# Patient Record
Sex: Female | Born: 1955 | Race: Black or African American | Hispanic: No | Marital: Single | Smoking: Former smoker
Health system: Southern US, Community
[De-identification: ages and names within clinical notes are randomized; demographics above are authoritative.]

## PROBLEM LIST (undated history)

## (undated) DIAGNOSIS — M545 Low back pain, unspecified: Secondary | ICD-10-CM

## (undated) DIAGNOSIS — J42 Unspecified chronic bronchitis: Secondary | ICD-10-CM

## (undated) DIAGNOSIS — B1920 Unspecified viral hepatitis C without hepatic coma: Secondary | ICD-10-CM

## (undated) DIAGNOSIS — C50912 Malignant neoplasm of unspecified site of left female breast: Secondary | ICD-10-CM

## (undated) DIAGNOSIS — M7750 Other enthesopathy of unspecified foot: Secondary | ICD-10-CM

## (undated) DIAGNOSIS — J302 Other seasonal allergic rhinitis: Secondary | ICD-10-CM

## (undated) DIAGNOSIS — F329 Major depressive disorder, single episode, unspecified: Secondary | ICD-10-CM

## (undated) DIAGNOSIS — G8929 Other chronic pain: Secondary | ICD-10-CM

## (undated) DIAGNOSIS — Z923 Personal history of irradiation: Secondary | ICD-10-CM

## (undated) DIAGNOSIS — F32A Depression, unspecified: Secondary | ICD-10-CM

## (undated) DIAGNOSIS — F419 Anxiety disorder, unspecified: Secondary | ICD-10-CM

## (undated) DIAGNOSIS — N183 Chronic kidney disease, stage 3 unspecified: Secondary | ICD-10-CM

## (undated) DIAGNOSIS — R252 Cramp and spasm: Secondary | ICD-10-CM

## (undated) DIAGNOSIS — G4700 Insomnia, unspecified: Secondary | ICD-10-CM

## (undated) DIAGNOSIS — K219 Gastro-esophageal reflux disease without esophagitis: Secondary | ICD-10-CM

## (undated) DIAGNOSIS — M7989 Other specified soft tissue disorders: Secondary | ICD-10-CM

## (undated) DIAGNOSIS — M1990 Unspecified osteoarthritis, unspecified site: Secondary | ICD-10-CM

## (undated) DIAGNOSIS — I10 Essential (primary) hypertension: Secondary | ICD-10-CM

## (undated) DIAGNOSIS — E119 Type 2 diabetes mellitus without complications: Secondary | ICD-10-CM

## (undated) HISTORY — PX: OTHER SURGICAL HISTORY: SHX169

## (undated) HISTORY — PX: COLONOSCOPY: SHX174

## (undated) HISTORY — PX: DILATION AND CURETTAGE OF UTERUS: SHX78

## (undated) HISTORY — DX: Unspecified osteoarthritis, unspecified site: M19.90

## (undated) HISTORY — PX: TONSILLECTOMY: SUR1361

## (undated) HISTORY — DX: Essential (primary) hypertension: I10

## (undated) HISTORY — PX: MOUTH SURGERY: SHX715

---

## 2000-09-02 ENCOUNTER — Other Ambulatory Visit: Admission: RE | Admit: 2000-09-02 | Discharge: 2000-09-02 | Payer: Self-pay | Admitting: Family Medicine

## 2000-10-23 ENCOUNTER — Ambulatory Visit (HOSPITAL_COMMUNITY): Admission: RE | Admit: 2000-10-23 | Discharge: 2000-10-23 | Payer: Self-pay | Admitting: Family Medicine

## 2000-10-23 ENCOUNTER — Encounter: Payer: Self-pay | Admitting: Family Medicine

## 2001-04-30 ENCOUNTER — Ambulatory Visit (HOSPITAL_COMMUNITY): Admission: RE | Admit: 2001-04-30 | Discharge: 2001-04-30 | Payer: Self-pay | Admitting: Family Medicine

## 2001-04-30 ENCOUNTER — Encounter: Payer: Self-pay | Admitting: Family Medicine

## 2001-05-22 ENCOUNTER — Ambulatory Visit (HOSPITAL_COMMUNITY): Admission: RE | Admit: 2001-05-22 | Discharge: 2001-05-22 | Payer: Self-pay | Admitting: Family Medicine

## 2001-05-22 ENCOUNTER — Encounter: Payer: Self-pay | Admitting: Family Medicine

## 2001-09-18 ENCOUNTER — Encounter: Admission: RE | Admit: 2001-09-18 | Discharge: 2001-10-16 | Payer: Self-pay | Admitting: Neurosurgery

## 2002-01-06 ENCOUNTER — Encounter: Payer: Self-pay | Admitting: Family Medicine

## 2002-01-06 ENCOUNTER — Ambulatory Visit (HOSPITAL_COMMUNITY): Admission: RE | Admit: 2002-01-06 | Discharge: 2002-01-06 | Payer: Self-pay | Admitting: Family Medicine

## 2002-05-06 ENCOUNTER — Ambulatory Visit (HOSPITAL_COMMUNITY): Admission: RE | Admit: 2002-05-06 | Discharge: 2002-05-06 | Payer: Self-pay | Admitting: Internal Medicine

## 2002-05-21 ENCOUNTER — Encounter: Admission: RE | Admit: 2002-05-21 | Discharge: 2002-05-21 | Payer: Self-pay | Admitting: Family Medicine

## 2002-05-21 ENCOUNTER — Encounter: Payer: Self-pay | Admitting: Family Medicine

## 2002-09-02 ENCOUNTER — Other Ambulatory Visit: Admission: RE | Admit: 2002-09-02 | Discharge: 2002-09-02 | Payer: Self-pay | Admitting: Family Medicine

## 2002-09-17 HISTORY — PX: TRIGGER FINGER RELEASE: SHX641

## 2003-01-26 ENCOUNTER — Ambulatory Visit (HOSPITAL_COMMUNITY): Admission: RE | Admit: 2003-01-26 | Discharge: 2003-01-26 | Payer: Self-pay | Admitting: Family Medicine

## 2003-01-26 ENCOUNTER — Encounter: Payer: Self-pay | Admitting: Family Medicine

## 2003-02-09 ENCOUNTER — Encounter: Payer: Self-pay | Admitting: Emergency Medicine

## 2003-02-09 ENCOUNTER — Emergency Department (HOSPITAL_COMMUNITY): Admission: EM | Admit: 2003-02-09 | Discharge: 2003-02-09 | Payer: Self-pay | Admitting: Emergency Medicine

## 2003-05-11 ENCOUNTER — Ambulatory Visit (HOSPITAL_BASED_OUTPATIENT_CLINIC_OR_DEPARTMENT_OTHER): Admission: RE | Admit: 2003-05-11 | Discharge: 2003-05-11 | Payer: Self-pay | Admitting: Orthopedic Surgery

## 2003-06-24 ENCOUNTER — Emergency Department (HOSPITAL_COMMUNITY): Admission: EM | Admit: 2003-06-24 | Discharge: 2003-06-24 | Payer: Self-pay | Admitting: Emergency Medicine

## 2003-08-16 ENCOUNTER — Ambulatory Visit (HOSPITAL_COMMUNITY): Admission: RE | Admit: 2003-08-16 | Discharge: 2003-08-16 | Payer: Self-pay | Admitting: Orthopedic Surgery

## 2003-08-16 ENCOUNTER — Ambulatory Visit (HOSPITAL_BASED_OUTPATIENT_CLINIC_OR_DEPARTMENT_OTHER): Admission: RE | Admit: 2003-08-16 | Discharge: 2003-08-16 | Payer: Self-pay | Admitting: Orthopedic Surgery

## 2004-01-14 ENCOUNTER — Ambulatory Visit (HOSPITAL_COMMUNITY): Admission: RE | Admit: 2004-01-14 | Discharge: 2004-01-14 | Payer: Self-pay | Admitting: Cardiology

## 2004-02-17 ENCOUNTER — Other Ambulatory Visit: Admission: RE | Admit: 2004-02-17 | Discharge: 2004-02-17 | Payer: Self-pay | Admitting: Family Medicine

## 2004-02-22 ENCOUNTER — Ambulatory Visit (HOSPITAL_COMMUNITY): Admission: RE | Admit: 2004-02-22 | Discharge: 2004-02-22 | Payer: Self-pay | Admitting: Family Medicine

## 2004-06-27 ENCOUNTER — Ambulatory Visit: Payer: Self-pay | Admitting: Family Medicine

## 2004-09-13 ENCOUNTER — Emergency Department (HOSPITAL_COMMUNITY): Admission: EM | Admit: 2004-09-13 | Discharge: 2004-09-13 | Payer: Self-pay

## 2005-04-25 ENCOUNTER — Ambulatory Visit: Payer: Self-pay | Admitting: Family Medicine

## 2005-04-25 ENCOUNTER — Other Ambulatory Visit: Admission: RE | Admit: 2005-04-25 | Discharge: 2005-04-25 | Payer: Self-pay | Admitting: Family Medicine

## 2005-05-31 ENCOUNTER — Ambulatory Visit: Payer: Self-pay | Admitting: Family Medicine

## 2005-08-02 ENCOUNTER — Ambulatory Visit: Payer: Self-pay | Admitting: Family Medicine

## 2005-08-06 ENCOUNTER — Ambulatory Visit: Payer: Self-pay | Admitting: *Deleted

## 2005-09-27 ENCOUNTER — Ambulatory Visit: Payer: Self-pay | Admitting: Internal Medicine

## 2005-11-28 ENCOUNTER — Ambulatory Visit: Payer: Self-pay | Admitting: Family Medicine

## 2005-11-29 ENCOUNTER — Ambulatory Visit (HOSPITAL_COMMUNITY): Admission: RE | Admit: 2005-11-29 | Discharge: 2005-11-29 | Payer: Self-pay | Admitting: Family Medicine

## 2006-04-03 ENCOUNTER — Ambulatory Visit (HOSPITAL_COMMUNITY): Admission: RE | Admit: 2006-04-03 | Discharge: 2006-04-03 | Payer: Self-pay | Admitting: Family Medicine

## 2006-04-03 ENCOUNTER — Ambulatory Visit: Payer: Self-pay | Admitting: Family Medicine

## 2006-04-11 ENCOUNTER — Ambulatory Visit: Payer: Self-pay | Admitting: Family Medicine

## 2006-07-10 ENCOUNTER — Ambulatory Visit: Payer: Self-pay | Admitting: Family Medicine

## 2006-07-14 ENCOUNTER — Encounter: Payer: Self-pay | Admitting: Family Medicine

## 2006-07-14 ENCOUNTER — Ambulatory Visit (HOSPITAL_COMMUNITY): Admission: RE | Admit: 2006-07-14 | Discharge: 2006-07-14 | Payer: Self-pay | Admitting: Internal Medicine

## 2006-07-24 ENCOUNTER — Ambulatory Visit (HOSPITAL_COMMUNITY): Admission: RE | Admit: 2006-07-24 | Discharge: 2006-07-24 | Payer: Self-pay | Admitting: Family Medicine

## 2006-07-31 ENCOUNTER — Encounter: Admission: RE | Admit: 2006-07-31 | Discharge: 2006-07-31 | Payer: Self-pay | Admitting: Family Medicine

## 2006-08-07 ENCOUNTER — Ambulatory Visit: Payer: Self-pay | Admitting: Family Medicine

## 2006-09-24 ENCOUNTER — Ambulatory Visit: Payer: Self-pay | Admitting: Family Medicine

## 2006-10-22 ENCOUNTER — Ambulatory Visit: Payer: Self-pay | Admitting: Family Medicine

## 2006-12-12 ENCOUNTER — Ambulatory Visit: Payer: Self-pay | Admitting: Family Medicine

## 2007-01-20 ENCOUNTER — Encounter: Admission: RE | Admit: 2007-01-20 | Discharge: 2007-01-20 | Payer: Self-pay | Admitting: Family Medicine

## 2007-02-05 ENCOUNTER — Ambulatory Visit: Payer: Self-pay | Admitting: Family Medicine

## 2007-03-03 ENCOUNTER — Ambulatory Visit: Payer: Self-pay | Admitting: Family Medicine

## 2007-05-15 DIAGNOSIS — J309 Allergic rhinitis, unspecified: Secondary | ICD-10-CM | POA: Insufficient documentation

## 2007-05-15 DIAGNOSIS — F1410 Cocaine abuse, uncomplicated: Secondary | ICD-10-CM | POA: Insufficient documentation

## 2007-05-15 DIAGNOSIS — M5030 Other cervical disc degeneration, unspecified cervical region: Secondary | ICD-10-CM

## 2007-05-15 DIAGNOSIS — N951 Menopausal and female climacteric states: Secondary | ICD-10-CM

## 2007-05-21 DIAGNOSIS — B1710 Acute hepatitis C without hepatic coma: Secondary | ICD-10-CM

## 2007-05-21 DIAGNOSIS — I10 Essential (primary) hypertension: Secondary | ICD-10-CM | POA: Insufficient documentation

## 2007-06-04 ENCOUNTER — Encounter (INDEPENDENT_AMBULATORY_CARE_PROVIDER_SITE_OTHER): Payer: Self-pay | Admitting: *Deleted

## 2007-06-24 ENCOUNTER — Ambulatory Visit: Payer: Self-pay | Admitting: Family Medicine

## 2007-08-25 ENCOUNTER — Ambulatory Visit: Payer: Self-pay | Admitting: Family Medicine

## 2007-11-13 ENCOUNTER — Emergency Department (HOSPITAL_COMMUNITY): Admission: EM | Admit: 2007-11-13 | Discharge: 2007-11-13 | Payer: Self-pay | Admitting: Family Medicine

## 2007-11-19 ENCOUNTER — Ambulatory Visit: Payer: Self-pay | Admitting: Family Medicine

## 2007-12-02 ENCOUNTER — Encounter: Admission: RE | Admit: 2007-12-02 | Discharge: 2007-12-29 | Payer: Self-pay | Admitting: Neurosurgery

## 2007-12-03 ENCOUNTER — Encounter: Admission: RE | Admit: 2007-12-03 | Discharge: 2007-12-03 | Payer: Self-pay | Admitting: Neurosurgery

## 2007-12-22 ENCOUNTER — Encounter: Admission: RE | Admit: 2007-12-22 | Discharge: 2008-03-21 | Payer: Self-pay | Admitting: Neurosurgery

## 2008-06-24 ENCOUNTER — Ambulatory Visit: Payer: Self-pay | Admitting: Internal Medicine

## 2008-06-28 ENCOUNTER — Encounter (INDEPENDENT_AMBULATORY_CARE_PROVIDER_SITE_OTHER): Payer: Self-pay | Admitting: Family Medicine

## 2008-06-28 ENCOUNTER — Ambulatory Visit: Payer: Self-pay | Admitting: Family Medicine

## 2008-06-28 LAB — CONVERTED CEMR LAB
Alkaline Phosphatase: 85 units/L (ref 39–117)
Anti Nuclear Antibody(ANA): NEGATIVE
Basophils Absolute: 0 10*3/uL (ref 0.0–0.1)
Creatinine, Ser: 0.64 mg/dL (ref 0.40–1.20)
Eosinophils Absolute: 0 10*3/uL (ref 0.0–0.7)
Eosinophils Relative: 1 % (ref 0–5)
Glucose, Bld: 103 mg/dL — ABNORMAL HIGH (ref 70–99)
HCT: 43 % (ref 36.0–46.0)
Hemoglobin: 14.2 g/dL (ref 12.0–15.0)
Lymphs Abs: 3 10*3/uL (ref 0.7–4.0)
MCV: 90.5 fL (ref 78.0–100.0)
Monocytes Absolute: 0.4 10*3/uL (ref 0.1–1.0)
Platelets: 228 10*3/uL (ref 150–400)
RDW: 13.4 % (ref 11.5–15.5)
Sodium: 140 meq/L (ref 135–145)
Total Bilirubin: 0.7 mg/dL (ref 0.3–1.2)
Total Protein: 7.4 g/dL (ref 6.0–8.3)

## 2008-07-01 ENCOUNTER — Encounter: Admission: RE | Admit: 2008-07-01 | Discharge: 2008-07-01 | Payer: Self-pay | Admitting: Family Medicine

## 2008-11-30 ENCOUNTER — Ambulatory Visit: Payer: Self-pay | Admitting: Family Medicine

## 2009-02-10 ENCOUNTER — Ambulatory Visit: Payer: Self-pay | Admitting: Family Medicine

## 2009-03-02 ENCOUNTER — Ambulatory Visit: Payer: Self-pay | Admitting: Family Medicine

## 2009-03-07 ENCOUNTER — Ambulatory Visit (HOSPITAL_COMMUNITY): Admission: RE | Admit: 2009-03-07 | Discharge: 2009-03-07 | Payer: Self-pay | Admitting: Family Medicine

## 2009-05-16 ENCOUNTER — Ambulatory Visit: Payer: Self-pay | Admitting: Internal Medicine

## 2009-07-04 ENCOUNTER — Encounter: Admission: RE | Admit: 2009-07-04 | Discharge: 2009-07-04 | Payer: Self-pay | Admitting: Family Medicine

## 2009-08-16 ENCOUNTER — Ambulatory Visit: Payer: Self-pay | Admitting: Family Medicine

## 2009-08-24 ENCOUNTER — Ambulatory Visit (HOSPITAL_COMMUNITY): Admission: RE | Admit: 2009-08-24 | Discharge: 2009-08-24 | Payer: Self-pay | Admitting: Family Medicine

## 2009-09-17 DIAGNOSIS — Z923 Personal history of irradiation: Secondary | ICD-10-CM

## 2009-09-17 DIAGNOSIS — C50912 Malignant neoplasm of unspecified site of left female breast: Secondary | ICD-10-CM

## 2009-09-17 HISTORY — DX: Personal history of irradiation: Z92.3

## 2009-09-17 HISTORY — PX: BREAST BIOPSY: SHX20

## 2009-09-17 HISTORY — DX: Malignant neoplasm of unspecified site of left female breast: C50.912

## 2009-10-28 ENCOUNTER — Ambulatory Visit: Payer: Self-pay | Admitting: Family Medicine

## 2009-10-28 LAB — CONVERTED CEMR LAB
ALT: 78 units/L — ABNORMAL HIGH (ref 0–35)
Alkaline Phosphatase: 81 units/L (ref 39–117)
Bilirubin, Direct: 0.1 mg/dL (ref 0.0–0.3)
CO2: 26 meq/L (ref 19–32)
Chloride: 102 meq/L (ref 96–112)
Hemoglobin: 14 g/dL (ref 12.0–15.0)
Indirect Bilirubin: 0.4 mg/dL (ref 0.0–0.9)
Lymphocytes Relative: 46 % (ref 12–46)
Lymphs Abs: 2.5 10*3/uL (ref 0.7–4.0)
MCHC: 33.7 g/dL (ref 30.0–36.0)
Monocytes Absolute: 0.5 10*3/uL (ref 0.1–1.0)
Monocytes Relative: 9 % (ref 3–12)
Neutro Abs: 2.4 10*3/uL (ref 1.7–7.7)
Neutrophils Relative %: 44 % (ref 43–77)
Potassium: 4.3 meq/L (ref 3.5–5.3)
RBC: 4.44 M/uL (ref 3.87–5.11)
Sed Rate: 13 mm/hr (ref 0–22)
Sodium: 138 meq/L (ref 135–145)
Total Protein: 7.3 g/dL (ref 6.0–8.3)
Vit D, 25-Hydroxy: 11 ng/mL — ABNORMAL LOW (ref 30–89)
WBC: 5.4 10*3/uL (ref 4.0–10.5)

## 2010-04-18 ENCOUNTER — Ambulatory Visit: Payer: Self-pay | Admitting: Family Medicine

## 2010-04-18 LAB — CONVERTED CEMR LAB
BUN: 13 mg/dL (ref 6–23)
CO2: 21 meq/L (ref 19–32)
Calcium: 9.2 mg/dL (ref 8.4–10.5)
Cholesterol: 177 mg/dL (ref 0–200)
Glucose, Bld: 108 mg/dL — ABNORMAL HIGH (ref 70–99)
Helicobacter Pylori Antibody-IgG: 5.9 — ABNORMAL HIGH
Hgb A1c MFr Bld: 5.9 % — ABNORMAL HIGH (ref <5.7)
Sodium: 138 meq/L (ref 135–145)
Total CHOL/HDL Ratio: 4.3
VLDL: 30 mg/dL (ref 0–40)

## 2010-07-05 ENCOUNTER — Encounter: Admission: RE | Admit: 2010-07-05 | Discharge: 2010-07-05 | Payer: Self-pay | Admitting: Family Medicine

## 2010-07-27 ENCOUNTER — Encounter: Admission: RE | Admit: 2010-07-27 | Discharge: 2010-07-27 | Payer: Self-pay | Admitting: Family Medicine

## 2010-08-02 ENCOUNTER — Encounter: Admission: RE | Admit: 2010-08-02 | Discharge: 2010-08-02 | Payer: Self-pay | Admitting: Family Medicine

## 2010-08-09 ENCOUNTER — Encounter: Admission: RE | Admit: 2010-08-09 | Discharge: 2010-08-09 | Payer: Self-pay | Admitting: Family Medicine

## 2010-08-17 HISTORY — PX: MASTECTOMY PARTIAL / LUMPECTOMY: SUR851

## 2010-08-22 ENCOUNTER — Encounter: Admission: RE | Admit: 2010-08-22 | Discharge: 2010-08-22 | Payer: Self-pay | Admitting: General Surgery

## 2010-08-22 ENCOUNTER — Ambulatory Visit (HOSPITAL_COMMUNITY)
Admission: RE | Admit: 2010-08-22 | Discharge: 2010-08-22 | Payer: Self-pay | Source: Home / Self Care | Admitting: General Surgery

## 2010-08-24 ENCOUNTER — Ambulatory Visit: Payer: Self-pay | Admitting: Oncology

## 2010-08-29 ENCOUNTER — Ambulatory Visit
Admission: RE | Admit: 2010-08-29 | Discharge: 2010-10-17 | Payer: Self-pay | Source: Home / Self Care | Attending: Radiation Oncology | Admitting: Radiation Oncology

## 2010-09-04 LAB — CBC WITH DIFFERENTIAL/PLATELET
BASO%: 0.7 % (ref 0.0–2.0)
Basophils Absolute: 0 10e3/uL (ref 0.0–0.1)
EOS%: 1 % (ref 0.0–7.0)
Eosinophils Absolute: 0.1 10e3/uL (ref 0.0–0.5)
HCT: 40.1 % (ref 34.8–46.6)
HGB: 13.9 g/dL (ref 11.6–15.9)
LYMPH%: 46.3 % (ref 14.0–49.7)
MCH: 32.3 pg (ref 25.1–34.0)
MCHC: 34.7 g/dL (ref 31.5–36.0)
MCV: 93.1 fL (ref 79.5–101.0)
MONO#: 0.6 10e3/uL (ref 0.1–0.9)
MONO%: 9.6 % (ref 0.0–14.0)
NEUT#: 2.5 10e3/uL (ref 1.5–6.5)
NEUT%: 42.4 % (ref 38.4–76.8)
Platelets: 229 10e3/uL (ref 145–400)
RBC: 4.31 10e6/uL (ref 3.70–5.45)
RDW: 13.1 % (ref 11.2–14.5)
WBC: 5.9 10e3/uL (ref 3.9–10.3)
lymph#: 2.7 10e3/uL (ref 0.9–3.3)

## 2010-09-04 LAB — COMPREHENSIVE METABOLIC PANEL WITH GFR
ALT: 95 U/L — ABNORMAL HIGH (ref 0–35)
AST: 99 U/L — ABNORMAL HIGH (ref 0–37)
Albumin: 3.9 g/dL (ref 3.5–5.2)
Alkaline Phosphatase: 94 U/L (ref 39–117)
BUN: 13 mg/dL (ref 6–23)
CO2: 27 meq/L (ref 19–32)
Calcium: 9.6 mg/dL (ref 8.4–10.5)
Chloride: 98 meq/L (ref 96–112)
Creatinine, Ser: 0.67 mg/dL (ref 0.40–1.20)
Glucose, Bld: 91 mg/dL (ref 70–99)
Potassium: 4.2 meq/L (ref 3.5–5.3)
Sodium: 135 meq/L (ref 135–145)
Total Bilirubin: 0.6 mg/dL (ref 0.3–1.2)
Total Protein: 6.9 g/dL (ref 6.0–8.3)

## 2010-10-18 ENCOUNTER — Ambulatory Visit: Payer: Medicaid Other | Attending: Radiation Oncology | Admitting: Radiation Oncology

## 2010-10-18 DIAGNOSIS — Z510 Encounter for antineoplastic radiation therapy: Secondary | ICD-10-CM | POA: Insufficient documentation

## 2010-10-18 DIAGNOSIS — L538 Other specified erythematous conditions: Secondary | ICD-10-CM | POA: Insufficient documentation

## 2010-10-18 DIAGNOSIS — C50519 Malignant neoplasm of lower-outer quadrant of unspecified female breast: Secondary | ICD-10-CM | POA: Insufficient documentation

## 2010-10-18 DIAGNOSIS — D0590 Unspecified type of carcinoma in situ of unspecified breast: Secondary | ICD-10-CM | POA: Insufficient documentation

## 2010-10-24 ENCOUNTER — Other Ambulatory Visit: Payer: Self-pay | Admitting: Oncology

## 2010-10-24 ENCOUNTER — Encounter (HOSPITAL_BASED_OUTPATIENT_CLINIC_OR_DEPARTMENT_OTHER): Payer: Medicaid Other | Admitting: Oncology

## 2010-10-24 DIAGNOSIS — D0590 Unspecified type of carcinoma in situ of unspecified breast: Secondary | ICD-10-CM

## 2010-10-24 DIAGNOSIS — C50519 Malignant neoplasm of lower-outer quadrant of unspecified female breast: Secondary | ICD-10-CM

## 2010-10-24 LAB — CBC WITH DIFFERENTIAL/PLATELET
BASO%: 0.3 % (ref 0.0–2.0)
EOS%: 1.4 % (ref 0.0–7.0)
HCT: 39.7 % (ref 34.8–46.6)
HGB: 13.6 g/dL (ref 11.6–15.9)
MCH: 31.9 pg (ref 25.1–34.0)
MCHC: 34.4 g/dL (ref 31.5–36.0)
MONO#: 0.6 10*3/uL (ref 0.1–0.9)
RDW: 13.5 % (ref 11.2–14.5)
WBC: 4.6 10*3/uL (ref 3.9–10.3)
lymph#: 1.6 10*3/uL (ref 0.9–3.3)

## 2010-10-24 LAB — COMPREHENSIVE METABOLIC PANEL
ALT: 96 U/L — ABNORMAL HIGH (ref 0–35)
AST: 102 U/L — ABNORMAL HIGH (ref 0–37)
Albumin: 3.5 g/dL (ref 3.5–5.2)
CO2: 28 mEq/L (ref 19–32)
Calcium: 9.3 mg/dL (ref 8.4–10.5)
Chloride: 104 mEq/L (ref 96–112)
Potassium: 4 mEq/L (ref 3.5–5.3)
Total Protein: 7.3 g/dL (ref 6.0–8.3)

## 2010-11-27 LAB — COMPREHENSIVE METABOLIC PANEL
BUN: 8 mg/dL (ref 6–23)
Calcium: 9.4 mg/dL (ref 8.4–10.5)
Glucose, Bld: 94 mg/dL (ref 70–99)
Sodium: 136 mEq/L (ref 135–145)
Total Protein: 7.2 g/dL (ref 6.0–8.3)

## 2010-11-27 LAB — URINALYSIS, ROUTINE W REFLEX MICROSCOPIC
Nitrite: NEGATIVE
Specific Gravity, Urine: 1.012 (ref 1.005–1.030)
pH: 6 (ref 5.0–8.0)

## 2010-11-27 LAB — DIFFERENTIAL
Lymphs Abs: 2.5 10*3/uL (ref 0.7–4.0)
Monocytes Relative: 9 % (ref 3–12)
Neutro Abs: 1.9 10*3/uL (ref 1.7–7.7)
Neutrophils Relative %: 38 % — ABNORMAL LOW (ref 43–77)

## 2010-11-27 LAB — CBC
HCT: 42.7 % (ref 36.0–46.0)
MCHC: 34.7 g/dL (ref 30.0–36.0)
MCV: 91.4 fL (ref 78.0–100.0)
RDW: 12.9 % (ref 11.5–15.5)

## 2010-11-27 LAB — SURGICAL PCR SCREEN: MRSA, PCR: NEGATIVE

## 2010-11-27 LAB — PROTIME-INR: INR: 1.09 (ref 0.00–1.49)

## 2010-11-27 LAB — APTT: aPTT: 35 seconds (ref 24–37)

## 2010-11-30 ENCOUNTER — Encounter (HOSPITAL_BASED_OUTPATIENT_CLINIC_OR_DEPARTMENT_OTHER): Payer: Medicaid Other | Admitting: Oncology

## 2010-11-30 DIAGNOSIS — C50519 Malignant neoplasm of lower-outer quadrant of unspecified female breast: Secondary | ICD-10-CM

## 2010-11-30 DIAGNOSIS — D0590 Unspecified type of carcinoma in situ of unspecified breast: Secondary | ICD-10-CM

## 2010-12-14 ENCOUNTER — Ambulatory Visit: Payer: Medicaid Other | Attending: Radiation Oncology | Admitting: Radiation Oncology

## 2011-02-02 NOTE — Op Note (Signed)
   NAMERANAE, CASEBIER                        ACCOUNT NO.:  [id number]   MEDICAL RECORD NO.:  [id number]                   PATIENT TYPE:  AMB   LOCATION:  DSC                                  FACILITY:  [facility]   PHYSICIAN:  Feliberto Gottron. Turner Daniels, M.D.                DATE OF BIRTH:  1955/11/03   DATE OF PROCEDURE:  05/11/2003  DATE OF DISCHARGE:  05/11/2003                                 OPERATIVE REPORT   PREOPERATIVE DIAGNOSIS:  Right thumb finger trigger.   POSTOPERATIVE DIAGNOSIS:  Right thumb finger trigger.   PROCEDURE:  Right thumb finger trigger release.   SURGEON:  Feliberto Gottron. Turner Daniels, M.D.   ANESTHESIA:  IV regional.   ESTIMATED BLOOD LOSS:  Minimal.   FLUIDS REPLACED:  700 mL crystalloid.   TOURNIQUET TIME:  20 minutes.   INDICATION FOR PROCEDURE:  A 55 year old woman with a locked right trigger  thumb, who has failed conservative treatment with observation and has  significant pain.  She desires elective right trigger thumb release and is  aware of the risks and benefits of surgery.   DESCRIPTION OF PROCEDURE:  The patient identified by arm band and taken to  the operating room at [facility].  Appropriate anesthetic  monitors were attached and forearm IV regional anesthesia induced to the  right upper extremity, which was then prepped and draped in the usual  sterile fashion from the fingertips to the tourniquet.  We began the  procedure by making a transverse incision over the A1 pulley about 1.5 cm in  length just through the skin and then dissected longitudinally in the  subcutaneous tissue down to the A1 pulley, being careful to avoid the  neurovascular bundles, which were protected with __________ retractors.  We  then took a section out of the A1 pulley, rectangular, about 2-3 mm wide,  over the length of the pulley, releasing the trigger finger.  Once this had  been accomplished, the patient was able to flex and extend the thumb without  difficulty.   At this point the wound was irrigated with normal saline  solution and the skin only closed with running 5-0 nylon suture.  A dressing  of Xeroform, 4 x 4 dressing sponges, and Coban was then applied.  The  patient's tourniquet was let down.  She was taken to the recovery room  without difficulty.                                               Feliberto Gottron. Turner Daniels, M.D.    Ovid Curd  D:  06/07/2003  T:  06/07/2003  Job:  [id number]

## 2011-02-02 NOTE — Op Note (Signed)
NAMESADEEL, FIDDLER                        ACCOUNT NO.:  [id number]   MEDICAL RECORD NO.:  [id number]                   PATIENT TYPE:  AMB   LOCATION:  DSC                                  FACILITY:  [facility]   PHYSICIAN:  Feliberto Gottron. Turner Daniels, M.D.                DATE OF BIRTH:  1956/07/02   DATE OF PROCEDURE:  08/16/2003  DATE OF DISCHARGE:                                 OPERATIVE REPORT   PREOPERATIVE DIAGNOSIS:  Left thumb finger trigger.   POSTOPERATIVE DIAGNOSIS:  Left thumb finger trigger.   PROCEDURE:  Left thumb finger trigger release.   SURGEON:  Feliberto Gottron. Turner Daniels, M.D.   FIRST ASSISTANT:  None.   ANESTHESIA:  General LMA.   ESTIMATED BLOOD LOSS:  Minimal.   FLUIDS REPLACED:  500 mL crystalloid.   DRAINS:  None.   INDICATIONS FOR PROCEDURE:  55 year old woman with symptomatic left thumb  finger trigger who has failed conservative treatment, observation, and anti-  inflammatory medicine.  She has declined a cortisone injection and her thumb  is actually hanging in an extended position quite a bit and very difficult  to get it to flex and when you do there is a fairly loud pop.  She has  elected thumb finger trigger release and accepts the risks and benefits of  surgery.   DESCRIPTION OF PROCEDURE:  The patient was identified by arm band and taken  to the operating room at [facility].  Appropriate anesthetic  monitors were attached and general LMA anesthesia was induced with the  patient in the supine position.  The tourniquet was applied to the left  forearm and the left hand prepped and draped in the usual sterile from the  finger tips to the wrist, the tourniquet inflated to 250 mmHg after wrapping  the hand with an Esmarch bandage.  We began the procedure by making a  transverse incision over the A1 pulley of the left thumb finger about 1.5 cm  lying just through the skin and into the subcutaneous tissue.  We then  spread with tenotomy scissors exposing  the flexor tendon sheath and  protecting the neurovascular bundles  medially and laterally.  A #15 blade  was used to remove a rectangular section of the tendon sheath volarly  including the A1 pulley exposing the tendon which was a little bit frayed  with a small nodule on it.  We then flexed and extended the thumb to confirm  that the trigger had been released and it had been.  At this point, the  wound was washed out with  normal saline solution.  The skin only was closed with running 5-0 nylon  suture.  A dressing of Xeroform, a couple of 4 by 4 folded dressing sponges,  2 inch Webril, and a 2 inch Ace wrap was applied.  The tourniquet was let  down.  The patient was then awakened and taken  to the recovery room without  difficulty.                                               Feliberto Gottron. Turner Daniels, M.D.    Ovid Curd  D:  08/16/2003  T:  08/16/2003  Job:  [id number]

## 2011-02-08 ENCOUNTER — Encounter (INDEPENDENT_AMBULATORY_CARE_PROVIDER_SITE_OTHER): Payer: Self-pay | Admitting: General Surgery

## 2011-04-30 ENCOUNTER — Other Ambulatory Visit: Payer: Self-pay | Admitting: Oncology

## 2011-04-30 ENCOUNTER — Encounter (HOSPITAL_BASED_OUTPATIENT_CLINIC_OR_DEPARTMENT_OTHER): Payer: Medicaid Other | Admitting: Oncology

## 2011-04-30 DIAGNOSIS — D0590 Unspecified type of carcinoma in situ of unspecified breast: Secondary | ICD-10-CM

## 2011-04-30 DIAGNOSIS — C50519 Malignant neoplasm of lower-outer quadrant of unspecified female breast: Secondary | ICD-10-CM

## 2011-04-30 LAB — COMPREHENSIVE METABOLIC PANEL
ALT: 93 U/L — ABNORMAL HIGH (ref 0–35)
AST: 92 U/L — ABNORMAL HIGH (ref 0–37)
Alkaline Phosphatase: 76 U/L (ref 39–117)
Calcium: 9.3 mg/dL (ref 8.4–10.5)
Chloride: 106 mEq/L (ref 96–112)
Creatinine, Ser: 0.55 mg/dL (ref 0.50–1.10)
Total Bilirubin: 0.5 mg/dL (ref 0.3–1.2)

## 2011-04-30 LAB — CBC WITH DIFFERENTIAL/PLATELET
BASO%: 0.4 % (ref 0.0–2.0)
EOS%: 0.9 % (ref 0.0–7.0)
HCT: 36 % (ref 34.8–46.6)
MCH: 32.8 pg (ref 25.1–34.0)
MCHC: 35.2 g/dL (ref 31.5–36.0)
NEUT%: 45.4 % (ref 38.4–76.8)
lymph#: 1.9 10*3/uL (ref 0.9–3.3)

## 2011-05-08 ENCOUNTER — Encounter (INDEPENDENT_AMBULATORY_CARE_PROVIDER_SITE_OTHER): Payer: Self-pay | Admitting: General Surgery

## 2011-05-30 ENCOUNTER — Other Ambulatory Visit: Payer: Self-pay | Admitting: Family Medicine

## 2011-05-30 DIAGNOSIS — C50919 Malignant neoplasm of unspecified site of unspecified female breast: Secondary | ICD-10-CM

## 2011-06-14 ENCOUNTER — Ambulatory Visit
Admission: RE | Admit: 2011-06-14 | Discharge: 2011-06-14 | Disposition: A | Discharge status: Home / Self Care | Payer: Medicaid Other | Source: Ambulatory Visit | Attending: Radiation Oncology | Admitting: Radiation Oncology

## 2011-07-10 ENCOUNTER — Ambulatory Visit
Admission: RE | Admit: 2011-07-10 | Discharge: 2011-07-10 | Disposition: A | Discharge status: Home / Self Care | Payer: Medicaid Other | Source: Ambulatory Visit | Attending: Family Medicine | Admitting: Family Medicine

## 2011-07-10 DIAGNOSIS — C50919 Malignant neoplasm of unspecified site of unspecified female breast: Secondary | ICD-10-CM

## 2011-08-03 ENCOUNTER — Telehealth: Payer: Self-pay | Admitting: *Deleted

## 2011-08-03 ENCOUNTER — Other Ambulatory Visit: Payer: Self-pay | Admitting: Oncology

## 2011-08-03 ENCOUNTER — Other Ambulatory Visit: Payer: Medicaid Other

## 2011-08-03 ENCOUNTER — Ambulatory Visit (HOSPITAL_BASED_OUTPATIENT_CLINIC_OR_DEPARTMENT_OTHER): Payer: Medicaid Other | Admitting: Oncology

## 2011-08-03 DIAGNOSIS — F341 Dysthymic disorder: Secondary | ICD-10-CM

## 2011-08-03 DIAGNOSIS — D0590 Unspecified type of carcinoma in situ of unspecified breast: Secondary | ICD-10-CM

## 2011-08-03 DIAGNOSIS — C50919 Malignant neoplasm of unspecified site of unspecified female breast: Secondary | ICD-10-CM

## 2011-08-03 LAB — CBC WITH DIFFERENTIAL/PLATELET
BASO%: 0.3 % (ref 0.0–2.0)
EOS%: 0.9 % (ref 0.0–7.0)
HCT: 38.3 % (ref 34.8–46.6)
LYMPH%: 39.1 % (ref 14.0–49.7)
MCH: 32.4 pg (ref 25.1–34.0)
MCHC: 34.6 g/dL (ref 31.5–36.0)
MCV: 93.7 fL (ref 79.5–101.0)
MONO%: 8 % (ref 0.0–14.0)
NEUT%: 51.7 % (ref 38.4–76.8)
Platelets: 184 10*3/uL (ref 145–400)

## 2011-08-03 LAB — COMPREHENSIVE METABOLIC PANEL
ALT: 69 U/L — ABNORMAL HIGH (ref 0–35)
AST: 84 U/L — ABNORMAL HIGH (ref 0–37)
BUN: 13 mg/dL (ref 6–23)
Creatinine, Ser: 0.78 mg/dL (ref 0.50–1.10)
Total Bilirubin: 0.7 mg/dL (ref 0.3–1.2)

## 2011-08-03 NOTE — Progress Notes (Signed)
OFFICE PROGRESS NOTE  CC Dr. Claud Kelp  Dr. 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  #2 she then received radiation therapy to the left breast from 09/21/2010 to 11/09/2010.  #3 she was then started on tamoxifen 20 mg daily in March 2012.  CURRENT THERAPY: Tamoxifen 20 mg daily since March 2012.  INTERVAL HISTORY: [place] 55 y.o. female returns for followup visit today. Overall she is tolerating the tamoxifen well she does complain of having hot sweats and flushing. She is on Effexor 75 mg daily. She does tell me that that keeps her up all flushing down a little bit.  MEDICAL HISTORY: Past Medical History  Diagnosis Date  . Allergy   . Hypertension   . Arthritis     ALLERGIES:  is allergic to aspirin and fexofenadine.  MEDICATIONS:  Current Outpatient Prescriptions  Medication Sig Dispense Refill  . clonazePAM (KLONOPIN) 0.5 MG tablet Take 0.25 mg by mouth as needed.        . tamoxifen (NOLVADEX) 20 MG tablet Take 20 mg by mouth daily.        . valsartan-hydrochlorothiazide (DIOVAN-HCT) 160-12.5 MG per tablet Take 1 tablet by mouth daily.       . benzonatate (TESSALON) 100 MG capsule Take 100 mg by mouth 3 (three) times daily as needed.        . cetirizine (ZYRTEC) 10 MG tablet Take 10 mg by mouth daily.        . cyclobenzaprine (FLEXERIL) 10 MG tablet Take 10 mg by mouth 3 (three) times daily as needed.        . diclofenac (VOLTAREN) 75 MG EC tablet Take 75 mg by mouth 2 (two) times daily.        . traMADol (ULTRAM) 50 MG tablet Take 50 mg by mouth every 6 (six) hours as needed.          SURGICAL HISTORY:  Past Surgical History  Procedure Date  . Thumb surgery     REVIEW OF SYSTEMS:  A  comprehensive review of systems was negative except for: Constitutional: positive for sweats   PHYSICAL EXAMINATION: General appearance: alert, cooperative and moderately obese Neck: no adenopathy, no carotid bruit, no JVD, supple, symmetrical, trachea midline and thyroid not enlarged, symmetric, no tenderness/mass/nodules Lymph nodes: Cervical, supraclavicular, and axillary nodes normal. Resp: clear to auscultation bilaterally and normal percussion bilaterally Back: symmetric, no curvature. ROM normal. No CVA tenderness. Cardio: regular rate and rhythm, S1, S2 normal, no murmur, click, rub or gallop and normal apical impulse GI: soft, non-tender; bowel sounds normal; no masses,  no organomegaly Extremities: extremities normal, atraumatic, no cyanosis or edema Neurologic: Alert and oriented X 3, normal strength and tone. Normal symmetric reflexes. Normal coordination and gait Sensory: normal Motor: grossly normal Reflexes: 2+ and symmetric Gait: Normal Bilateral breast exams are performed. Both of her breasts appear normal. There is a surgical scar in the left. No overlying nodularity no changes no masses no nipple discharge. Right breast no masses or nipple discharge.  ECOG PERFORMANCE STATUS: 0 - Asymptomatic  Blood pressure 126/74, pulse 80, temperature 98.4 F (36.9 C), height 5\' 7"  (1.702 m), weight 224 lb 6.4 oz (101.787 kg).  LABORATORY DATA: Lab Results  Component Value Date   WBC 4.0  08/03/2011   HGB 13.3 08/03/2011   HCT 38.3 08/03/2011   MCV 93.7 08/03/2011   PLT 184 08/03/2011      Chemistry      Component Value Date/Time   NA 140 04/30/2011 1532   K 3.6 04/30/2011 1532   CL 106 04/30/2011 1532   CO2 24 04/30/2011 1532   BUN 9 04/30/2011 1532   CREATININE 0.55 04/30/2011 1532      Component Value Date/Time   CALCIUM 9.3 04/30/2011 1532   ALKPHOS 76 04/30/2011 1532   AST 92* 04/30/2011 1532   ALT 93* 04/30/2011 1532   BILITOT 0.5 04/30/2011 1532       RADIOGRAPHIC  STUDIES:  No results found.  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 She was then begun on tamoxifen in March 2012 overall she is tolerating it well. Patient does have anxiety depression and hot flashes and we have her on Effexor 75 mg daily and that is helping her considerably well.   PLAN: Continue to tamoxifen 20 mg on a daily basis for a total of 5 years. She will continue the Effexor 75 mg daily. She will exercise eat a healthy diet try to maintain a good body weight. She is overweight. She understands the risks of this. I will plan on seeing her back in 6 months time of course she can be seen sooner if need arises.   All questions were answered. The patient knows to call the clinic with any problems, questions or concerns. We can certainly see the patient much sooner if necessary.  I spent 20 minutes counseling the patient face to face. The total time spent in the appointment was 30 minutes.    Drue Second, MD Medical/Oncology [facility] [phone] (beeper) [phone] (Office)  08/03/2011, 1:58 PM

## 2011-08-03 NOTE — Telephone Encounter (Signed)
GAVE PATIENT APPOINTMENT FOR 2013 

## 2011-10-29 ENCOUNTER — Other Ambulatory Visit (HOSPITAL_COMMUNITY)
Admission: RE | Admit: 2011-10-29 | Discharge: 2011-10-29 | Disposition: A | Discharge status: Home / Self Care | Payer: Medicaid Other | Source: Ambulatory Visit | Attending: Family Medicine | Admitting: Family Medicine

## 2011-10-29 ENCOUNTER — Other Ambulatory Visit: Payer: Self-pay | Admitting: Family Medicine

## 2011-10-29 DIAGNOSIS — Z01419 Encounter for gynecological examination (general) (routine) without abnormal findings: Secondary | ICD-10-CM | POA: Insufficient documentation

## 2011-11-13 ENCOUNTER — Ambulatory Visit (HOSPITAL_COMMUNITY)
Admission: RE | Admit: 2011-11-13 | Discharge: 2011-11-13 | Disposition: A | Discharge status: Home / Self Care | Payer: Medicaid Other | Source: Ambulatory Visit | Attending: Family Medicine | Admitting: Family Medicine

## 2011-11-13 ENCOUNTER — Other Ambulatory Visit (HOSPITAL_COMMUNITY): Payer: Self-pay | Admitting: Family Medicine

## 2011-11-13 DIAGNOSIS — M25569 Pain in unspecified knee: Secondary | ICD-10-CM | POA: Insufficient documentation

## 2011-11-13 DIAGNOSIS — M1710 Unilateral primary osteoarthritis, unspecified knee: Secondary | ICD-10-CM | POA: Insufficient documentation

## 2011-11-13 DIAGNOSIS — R52 Pain, unspecified: Secondary | ICD-10-CM

## 2011-11-13 DIAGNOSIS — IMO0002 Reserved for concepts with insufficient information to code with codable children: Secondary | ICD-10-CM | POA: Insufficient documentation

## 2012-01-28 ENCOUNTER — Other Ambulatory Visit (HOSPITAL_BASED_OUTPATIENT_CLINIC_OR_DEPARTMENT_OTHER): Payer: Medicaid Other | Admitting: Lab

## 2012-01-28 ENCOUNTER — Telehealth: Payer: Self-pay | Admitting: Oncology

## 2012-01-28 ENCOUNTER — Ambulatory Visit (HOSPITAL_BASED_OUTPATIENT_CLINIC_OR_DEPARTMENT_OTHER): Payer: Medicaid Other | Admitting: Oncology

## 2012-01-28 ENCOUNTER — Encounter: Payer: Self-pay | Admitting: Oncology

## 2012-01-28 VITALS — BP 127/77 | HR 72 | Temp 98.4°F | Ht 67.0 in | Wt 219.6 lb

## 2012-01-28 DIAGNOSIS — Z170 Estrogen receptor positive status [ER+]: Secondary | ICD-10-CM

## 2012-01-28 DIAGNOSIS — D0512 Intraductal carcinoma in situ of left breast: Secondary | ICD-10-CM | POA: Insufficient documentation

## 2012-01-28 DIAGNOSIS — C50919 Malignant neoplasm of unspecified site of unspecified female breast: Secondary | ICD-10-CM

## 2012-01-28 DIAGNOSIS — D0590 Unspecified type of carcinoma in situ of unspecified breast: Secondary | ICD-10-CM

## 2012-01-28 DIAGNOSIS — D0510 Intraductal carcinoma in situ of unspecified breast: Secondary | ICD-10-CM

## 2012-01-28 LAB — CBC WITH DIFFERENTIAL/PLATELET
BASO%: 0.3 % (ref 0.0–2.0)
MCHC: 34.2 g/dL (ref 31.5–36.0)
MONO#: 0.4 10*3/uL (ref 0.1–0.9)
RBC: 3.93 10*6/uL (ref 3.70–5.45)
RDW: 13 % (ref 11.2–14.5)
WBC: 3.8 10*3/uL — ABNORMAL LOW (ref 3.9–10.3)
lymph#: 1.5 10*3/uL (ref 0.9–3.3)
nRBC: 0 % (ref 0–0)

## 2012-01-28 LAB — COMPREHENSIVE METABOLIC PANEL
ALT: 64 U/L — ABNORMAL HIGH (ref 0–35)
AST: 65 U/L — ABNORMAL HIGH (ref 0–37)
Albumin: 3.8 g/dL (ref 3.5–5.2)
Calcium: 9.3 mg/dL (ref 8.4–10.5)
Chloride: 106 mEq/L (ref 96–112)
Creatinine, Ser: 0.69 mg/dL (ref 0.50–1.10)
Potassium: 4.5 mEq/L (ref 3.5–5.3)

## 2012-01-28 MED ORDER — TAMOXIFEN CITRATE 20 MG PO TABS: 20.0000 mg | ORAL_TABLET | Freq: Every day | ORAL | Status: DC

## 2012-01-28 NOTE — Patient Instructions (Signed)
Retrun in 6 month for follow up

## 2012-01-28 NOTE — Telephone Encounter (Signed)
gve the pt her nov 2013 appt calendars °

## 2012-01-28 NOTE — Progress Notes (Signed)
OFFICE PROGRESS NOTE  CC Dr. Claud Kelp Dr. 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  #2 she then received radiation therapy to the left breast from 09/21/2010 to 11/09/2010.  #3 she was then started on tamoxifen 20 mg daily in March 2012.  CURRENT THERAPY: Tamoxifen 20 mg daily since March 2012.  INTERVAL HISTORY: [place] 56 y.o. female returns for followup visit today. Overall she is tolerating the tamoxifen well she does complain of having hot sweats and flushing. She is on Effexor 75 mg daily. She does tell me that that keeps her up all flushing down a little bit.  MEDICAL HISTORY: Past Medical History  Diagnosis Date  . Allergy   . Hypertension   . Arthritis     ALLERGIES:  is allergic to aspirin and fexofenadine.  MEDICATIONS:  Current Outpatient Prescriptions  Medication Sig Dispense Refill  . benzonatate (TESSALON) 100 MG capsule Take 100 mg by mouth 3 (three) times daily as needed.        . cetirizine (ZYRTEC) 10 MG tablet Take 10 mg by mouth daily.        . clonazePAM (KLONOPIN) 0.5 MG tablet Take 0.25 mg by mouth as needed.        . cyclobenzaprine (FLEXERIL) 10 MG tablet Take 10 mg by mouth 3 (three) times daily as needed.        . diclofenac (VOLTAREN) 75 MG EC tablet Take 75 mg by mouth 2 (two) times daily.        . tamoxifen (NOLVADEX) 20 MG tablet Take 20 mg by mouth daily.        . traMADol (ULTRAM) 50 MG tablet Take 50 mg by mouth every 6 (six) hours as needed.        . valsartan-hydrochlorothiazide (DIOVAN-HCT) 160-12.5 MG per tablet Take 1 tablet by mouth daily.         SURGICAL HISTORY:  Past Surgical History  Procedure Date  . Thumb surgery     REVIEW OF SYSTEMS:  A  comprehensive review of systems was negative except for: Constitutional: positive for sweats   PHYSICAL EXAMINATION: General appearance: alert, cooperative and moderately obese Neck: no adenopathy, no carotid bruit, no JVD, supple, symmetrical, trachea midline and thyroid not enlarged, symmetric, no tenderness/mass/nodules Lymph nodes: Cervical, supraclavicular, and axillary nodes normal. Resp: clear to auscultation bilaterally and normal percussion bilaterally Back: symmetric, no curvature. ROM normal. No CVA tenderness. Cardio: regular rate and rhythm, S1, S2 normal, no murmur, click, rub or gallop and normal apical impulse GI: soft, non-tender; bowel sounds normal; no masses,  no organomegaly Extremities: extremities normal, atraumatic, no cyanosis or edema Neurologic: Alert and oriented X 3, normal strength and tone. Normal symmetric reflexes. Normal coordination and gait Sensory: normal Motor: grossly normal Reflexes: 2+ and symmetric Gait: Normal Bilateral breast exams are performed. Both of her breasts appear normal. There is a surgical scar in the left. No overlying nodularity no changes no masses no nipple discharge. Right breast no masses or nipple discharge.  ECOG PERFORMANCE STATUS: 0 - Asymptomatic  Blood pressure 127/77, pulse 72, temperature 98.4 F (36.9 C), temperature source Oral, height 5\' 7"  (1.702 m), weight 219 lb 9.6 oz (99.61 kg).  LABORATORY DATA: Lab Results  Component Value Date  WBC 3.8* 01/28/2012   HGB 12.7 01/28/2012   HCT 37.3 01/28/2012   MCV 94.9 01/28/2012   PLT 177 01/28/2012      Chemistry      Component Value Date/Time   NA 139 08/03/2011 1100   K 3.8 08/03/2011 1100   CL 108 08/03/2011 1100   CO2 22 08/03/2011 1100   BUN 13 08/03/2011 1100   CREATININE 0.78 08/03/2011 1100      Component Value Date/Time   CALCIUM 9.2 08/03/2011 1100   ALKPHOS 70 08/03/2011 1100   AST 84* 08/03/2011 1100   ALT 69* 08/03/2011 1100   BILITOT 0.7  08/03/2011 1100       RADIOGRAPHIC STUDIES:  No results found.  ASSESSMENT: 56 year old female with DCIS of the left breast status post lumpectomy in December 2011 followed by radiation therapy which he completed in February 2012. She was then begun on tamoxifen in March 2012 overall she is tolerating it well. Patient does have anxiety depression and hot flashes and we have her on Effexor 75 mg daily and that is helping her considerably well.   PLAN: Continue to tamoxifen 20 mg on a daily basis for a total of 5 years. She will continue the Effexor 75 mg daily. She will exercise eat a healthy diet try to maintain a good body weight. She is overweight. She understands the risks of this. I will plan on seeing her back in 6 months time of course she can be seen sooner if need arises.   All questions were answered. The patient knows to call the clinic with any problems, questions or concerns. We can certainly see the patient much sooner if necessary.  I spent 20 minutes counseling the patient face to face. The total time spent in the appointment was 30 minutes.    Drue Second, MD Medical/Oncology [facility] [place] [address] [facility] [phone] (beeper) [phone] (Office)  01/28/2012, 11:57 AM

## 2012-04-08 ENCOUNTER — Other Ambulatory Visit (HOSPITAL_COMMUNITY): Payer: Self-pay | Admitting: Family Medicine

## 2012-04-08 DIAGNOSIS — B1710 Acute hepatitis C without hepatic coma: Secondary | ICD-10-CM

## 2012-04-11 ENCOUNTER — Ambulatory Visit (HOSPITAL_COMMUNITY)
Admission: RE | Admit: 2012-04-11 | Discharge: 2012-04-11 | Disposition: A | Discharge status: Home / Self Care | Payer: Medicaid Other | Source: Ambulatory Visit | Attending: Family Medicine | Admitting: Family Medicine

## 2012-04-11 DIAGNOSIS — Q619 Cystic kidney disease, unspecified: Secondary | ICD-10-CM | POA: Insufficient documentation

## 2012-04-11 DIAGNOSIS — F1410 Cocaine abuse, uncomplicated: Secondary | ICD-10-CM | POA: Insufficient documentation

## 2012-04-11 DIAGNOSIS — I10 Essential (primary) hypertension: Secondary | ICD-10-CM | POA: Insufficient documentation

## 2012-04-11 DIAGNOSIS — B1710 Acute hepatitis C without hepatic coma: Secondary | ICD-10-CM | POA: Insufficient documentation

## 2012-05-06 ENCOUNTER — Other Ambulatory Visit: Payer: Self-pay | Admitting: *Deleted

## 2012-05-06 DIAGNOSIS — D0590 Unspecified type of carcinoma in situ of unspecified breast: Secondary | ICD-10-CM

## 2012-05-06 MED ORDER — VENLAFAXINE HCL ER 75 MG PO TB24: 1.0000 | ORAL_TABLET | Freq: Every day | ORAL | Status: DC

## 2012-06-09 ENCOUNTER — Telehealth: Payer: Self-pay | Admitting: *Deleted

## 2012-06-09 ENCOUNTER — Other Ambulatory Visit: Payer: Self-pay | Admitting: Medical Oncology

## 2012-06-09 DIAGNOSIS — D0590 Unspecified type of carcinoma in situ of unspecified breast: Secondary | ICD-10-CM

## 2012-06-09 NOTE — Telephone Encounter (Signed)
Patient here requesting an appointment for mammogram.  Last digital diagnostic mammogram was done July 10, 2011 at the breast center.  Patient is no longer followed by [facility] and has no MD to order this test.  Would like a morning appointment to pick her grandchild up from school.  Will notify providers.  Next f/u is July 18, 2012 so she'll need to be scheduled October 24th through October 31st.  Business Card given to patient due to her saying she does not have the [facility] phone number on her appointment calendar she was given.

## 2012-06-09 NOTE — Telephone Encounter (Signed)
please schedule patient for mammogram, patient prefers morning appointment  Breast center

## 2012-07-10 ENCOUNTER — Ambulatory Visit
Admission: RE | Admit: 2012-07-10 | Discharge: 2012-07-10 | Disposition: A | Discharge status: Home / Self Care | Payer: Medicaid Other | Source: Ambulatory Visit | Attending: Oncology | Admitting: Oncology

## 2012-07-10 DIAGNOSIS — D0590 Unspecified type of carcinoma in situ of unspecified breast: Secondary | ICD-10-CM

## 2012-07-17 ENCOUNTER — Encounter (HOSPITAL_COMMUNITY): Payer: Self-pay | Admitting: Emergency Medicine

## 2012-07-17 ENCOUNTER — Emergency Department (HOSPITAL_COMMUNITY)
Admission: EM | Admit: 2012-07-17 | Discharge: 2012-07-17 | Disposition: A | Discharge status: Home / Self Care | Payer: Medicaid Other | Source: Home / Self Care | Attending: Emergency Medicine | Admitting: Emergency Medicine

## 2012-07-17 DIAGNOSIS — M26629 Arthralgia of temporomandibular joint, unspecified side: Secondary | ICD-10-CM

## 2012-07-17 DIAGNOSIS — I10 Essential (primary) hypertension: Secondary | ICD-10-CM

## 2012-07-17 DIAGNOSIS — F329 Major depressive disorder, single episode, unspecified: Secondary | ICD-10-CM

## 2012-07-17 DIAGNOSIS — C50919 Malignant neoplasm of unspecified site of unspecified female breast: Secondary | ICD-10-CM

## 2012-07-17 DIAGNOSIS — S39012A Strain of muscle, fascia and tendon of lower back, initial encounter: Secondary | ICD-10-CM

## 2012-07-17 DIAGNOSIS — F32A Depression, unspecified: Secondary | ICD-10-CM

## 2012-07-17 DIAGNOSIS — S335XXA Sprain of ligaments of lumbar spine, initial encounter: Secondary | ICD-10-CM

## 2012-07-17 MED ORDER — PREDNISONE 5 MG PO KIT: 1.0000 | PACK | Freq: Every day | ORAL | Status: DC

## 2012-07-17 MED ORDER — BENZONATATE 200 MG PO CAPS: 200.0000 mg | ORAL_CAPSULE | Freq: Three times a day (TID) | ORAL | Status: DC | PRN

## 2012-07-17 MED ORDER — CYCLOBENZAPRINE HCL 5 MG PO TABS: 5.0000 mg | ORAL_TABLET | Freq: Three times a day (TID) | ORAL | Status: DC | PRN

## 2012-07-17 MED ORDER — TRAMADOL HCL 50 MG PO TABS: 100.0000 mg | ORAL_TABLET | Freq: Three times a day (TID) | ORAL | Status: DC | PRN

## 2012-07-17 MED ORDER — CLONAZEPAM 0.25 MG PO TBDP: 0.2500 mg | ORAL_TABLET | Freq: Two times a day (BID) | ORAL | Status: DC | PRN

## 2012-07-17 MED ORDER — DICLOFENAC SODIUM 75 MG PO TBEC: 75.0000 mg | DELAYED_RELEASE_TABLET | Freq: Two times a day (BID) | ORAL | Status: DC

## 2012-07-17 NOTE — ED Provider Notes (Signed)
Chief Complaint  Patient presents with  . Back Pain  . Otalgia    History of Present Illness:   The patient is a 56 year old female, a prior patient of the [facility] who presents today with lower back pain and a secondary complaint of left ear pain. The patient had a fall in 2000. She's had back problems since then. It flared up again this past Saturday. She denies any recent injury. It's located at the midline in the lower back with radiation to both thighs. The pain is worse with any movement and better with pain patches. It is rated 8-9/10 in intensity. She denies any numbness, tingling, or weakness in the legs. She's had no dysuria, frequency, urinary incontinence, bowel incontinence, or saddle anesthesia. She denies any abdominal pain, fever, chills, or unintended weight loss. She also has a four-day history of pain in the left ear. She denies any drainage. She has chronic problems with allergies and nasal congestion and she takes medication for that. She has a history of high blood pressure, breast cancer, asthma, chronic cough, and depression. She also has hepatitis C. She's on a number of medications that she needs refills for.  Review of Systems:  Other than noted above, the patient denies any of the following symptoms: Systemic:  No fever, chills, severe fatigue, or unexplained weight loss. GI:  No abdominal pain, nausea, vomiting, diarrhea, constipation, incontinence of bowel, or blood in stool. GU:  No dysuria, frequency, urgency, or hematuria. No incontinence of urine or difficulty urinating.  M-S:  No neck pain, joint pain, arthritis, or myalgias. Neuro:  No paresthesias, saddle anesthesia, muscular weakness, or progressive neurological deficit.  PMFSH:  Past medical history, family history, social history, meds, and allergies were reviewed. Specifically, there is no history of cancer, major trauma, osteoporosis, immunosuppression, HIV, or IV or injection drug use.     Physical Exam:   Vital signs:  BP 122/73  Pulse 80  Temp 98.5 F (36.9 C) (Oral)  Resp 20  SpO2 97% General:  Alert, oriented, in no distress. ENT: Both TMs and canals are normal. She does have some tenderness over the TMJ on the left. There is no swelling and no palpable pop or crepitus. Abdomen:  Soft, non-tender.  No organomegaly or mass.  No pulsatile midline abdominal mass or bruit. Back:  There is pain to palpation over the L5-S1 area which spread to both SI areas in her back has a limited range of motion with 45 of flexion, 10 of extension, 20 of lateral bending, and 60 of rotation with pain in all directions. Straight leg raising was negative bilaterally with a negative Lasegue's sign and negative popliteal compression sign. Neuro:  Normal muscle strength, sensations and DTRs. Extremities: Pedal pulses were full, there was no edema. Skin:  Clear, warm and dry.  No rash.  Assessment:  The primary encounter diagnosis was Lumbar strain. Diagnoses of TMJ syndrome, Hypertension, Depression, and Breast cancer were also pertinent to this visit.  Plan:   1.  The following meds were prescribed:   New Prescriptions   BENZONATATE (TESSALON) 200 MG CAPSULE    Take 1 capsule (200 mg total) by mouth 3 (three) times daily as needed for cough.   CLONAZEPAM (KLONOPIN) 0.25 MG DISINTEGRATING TABLET    Take 1 tablet (0.25 mg total) by mouth 2 (two) times daily as needed.   CYCLOBENZAPRINE (FLEXERIL) 5 MG TABLET    Take 1 tablet (5 mg total) by mouth 3 (three) times  daily as needed for muscle spasms.   DICLOFENAC (VOLTAREN) 75 MG EC TABLET    Take 1 tablet (75 mg total) by mouth 2 (two) times daily.   PREDNISONE 5 MG KIT    Take 1 kit (5 mg total) by mouth daily after breakfast. Prednisone 5 mg 6 day dosepack.  Take as directed.   TRAMADOL (ULTRAM) 50 MG TABLET    Take 2 tablets (100 mg total) by mouth every 8 (eight) hours as needed for pain.   2.  The patient was instructed in symptomatic  care and handouts were given. 3.  The patient was told to return if becoming worse in any way, if no better in 2 weeks, and given some red flag symptoms that would indicate earlier return. 4.  The patient was encouraged to try to be as active as possible and given some exercises to do followed by moist heat.    Reuben Likes, MD 07/17/12 2029

## 2012-07-17 NOTE — ED Notes (Signed)
C/O lower back pain x 4-5 days. States was pt of [facility] and is out of Flexeril and Ultram. Also c/o left ear pain x 2 days

## 2012-07-18 ENCOUNTER — Other Ambulatory Visit: Payer: Medicaid Other | Admitting: Lab

## 2012-07-18 ENCOUNTER — Ambulatory Visit: Payer: Medicaid Other | Admitting: Adult Health

## 2012-07-22 ENCOUNTER — Other Ambulatory Visit (HOSPITAL_BASED_OUTPATIENT_CLINIC_OR_DEPARTMENT_OTHER): Payer: Medicaid Other

## 2012-07-22 ENCOUNTER — Telehealth: Payer: Self-pay | Admitting: Oncology

## 2012-07-22 ENCOUNTER — Ambulatory Visit (HOSPITAL_BASED_OUTPATIENT_CLINIC_OR_DEPARTMENT_OTHER): Payer: Medicaid Other | Admitting: Adult Health

## 2012-07-22 ENCOUNTER — Encounter: Payer: Self-pay | Admitting: Adult Health

## 2012-07-22 VITALS — BP 118/75 | HR 70 | Temp 98.6°F | Resp 20 | Ht 67.0 in | Wt 232.1 lb

## 2012-07-22 DIAGNOSIS — D0590 Unspecified type of carcinoma in situ of unspecified breast: Secondary | ICD-10-CM

## 2012-07-22 DIAGNOSIS — N959 Unspecified menopausal and perimenopausal disorder: Secondary | ICD-10-CM

## 2012-07-22 DIAGNOSIS — D0510 Intraductal carcinoma in situ of unspecified breast: Secondary | ICD-10-CM

## 2012-07-22 DIAGNOSIS — F411 Generalized anxiety disorder: Secondary | ICD-10-CM

## 2012-07-22 LAB — CBC WITH DIFFERENTIAL/PLATELET
Basophils Absolute: 0 10*3/uL (ref 0.0–0.1)
Eosinophils Absolute: 0 10*3/uL (ref 0.0–0.5)
HGB: 13.1 g/dL (ref 11.6–15.9)
MONO#: 0.3 10*3/uL (ref 0.1–0.9)
NEUT#: 2.2 10*3/uL (ref 1.5–6.5)
RDW: 13.6 % (ref 11.2–14.5)
lymph#: 1.7 10*3/uL (ref 0.9–3.3)

## 2012-07-22 LAB — COMPREHENSIVE METABOLIC PANEL (CC13)
AST: 123 U/L — ABNORMAL HIGH (ref 5–34)
Albumin: 3.4 g/dL — ABNORMAL LOW (ref 3.5–5.0)
BUN: 13 mg/dL (ref 7.0–26.0)
CO2: 24 mEq/L (ref 22–29)
Calcium: 9.2 mg/dL (ref 8.4–10.4)
Chloride: 108 mEq/L — ABNORMAL HIGH (ref 98–107)
Glucose: 121 mg/dl — ABNORMAL HIGH (ref 70–99)
Potassium: 3.7 mEq/L (ref 3.5–5.1)

## 2012-07-22 NOTE — Progress Notes (Signed)
OFFICE PROGRESS NOTE  CC Dr. Claud Kelp Dr. Leitha Bleak, MD [address] 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  #2 she then received radiation therapy to the left breast from 09/21/2010 to 11/09/2010.  #3 she was then started on tamoxifen 20 mg daily in March 2012.  CURRENT THERAPY: Tamoxifen 20 mg daily since March 2012.  INTERVAL HISTORY: [place] 55 y.o. female returns for followup visit today.  She continues to have hot flashes.  She is taking Effexor for this.  She is tolerating the side effects of the Tamoxifen and is taking her medication every day.  She has had some weight gain, and has tried exercise, but had difficulty with back pain after going to the gym recently.   MEDICAL HISTORY: Past Medical History  Diagnosis Date  . Allergy   . Hypertension   . Arthritis   . Cancer     ALLERGIES:  is allergic to aspirin and fexofenadine.  MEDICATIONS:  Current Outpatient Prescriptions  Medication Sig Dispense Refill  . benzonatate (TESSALON) 100 MG capsule Take 100 mg by mouth 3 (three) times daily as needed.        . benzonatate (TESSALON) 200 MG capsule Take 1 capsule (200 mg total) by mouth 3 (three) times daily as needed for cough.  30 capsule  3  . cetirizine (ZYRTEC) 10 MG tablet Take 10 mg by mouth daily.        . clonazePAM (KLONOPIN) 0.25 MG disintegrating tablet Take 1 tablet (0.25 mg total) by mouth 2 (two) times daily as needed.  60 tablet  0  . clonazePAM (KLONOPIN) 0.5 MG tablet Take 0.25 mg by mouth as needed.        . cyclobenzaprine (FLEXERIL) 10 MG tablet Take 10 mg by mouth 3 (three) times daily as needed.        . cyclobenzaprine (FLEXERIL) 5 MG tablet Take 1 tablet (5 mg total) by mouth 3 (three) times daily  as needed for muscle spasms.  30 tablet  3  . diclofenac (VOLTAREN) 75 MG EC tablet Take 75 mg by mouth 2 (two) times daily.        . diclofenac (VOLTAREN) 75 MG EC tablet Take 1 tablet (75 mg total) by mouth 2 (two) times daily.  30 tablet  3  . PredniSONE 5 MG KIT Take 1 kit (5 mg total) by mouth daily after breakfast. Prednisone 5 mg 6 day dosepack.  Take as directed.  1 kit  0  . tamoxifen (NOLVADEX) 20 MG tablet Take 1 tablet (20 mg total) by mouth daily.  90 tablet  12  . traMADol (ULTRAM) 50 MG tablet Take 50 mg by mouth every 6 (six) hours as needed.        . traMADol (ULTRAM) 50 MG tablet Take 2 tablets (100 mg total) by mouth every 8 (eight) hours as needed for pain.  30 tablet  0  . valsartan-hydrochlorothiazide (DIOVAN-HCT) 160-12.5 MG per tablet Take 1 tablet by mouth daily.       . Venlafaxine HCl 75 MG TB24 Take 1 tablet (75 mg total) by mouth daily.  90 each  0    SURGICAL HISTORY:  Past Surgical History  Procedure Date  . Thumb surgery     REVIEW OF SYSTEMS:  General:  fatigue (-), night sweats (-), fever (-), pain (-) Lymph: palpable nodes (-) HEENT: vision changes (-), mucositis (-), gum bleeding (-), epistaxis (-) Cardiovascular: chest pain (-), palpitations (-) Pulmonary: shortness of breath (-), dyspnea on exertion (-), cough (-), hemoptysis (-) GI:  Early satiety (-), melena (-), dysphagia (-), nausea/vomiting (-), diarrhea (-) GU: dysuria (-), hematuria (-), incontinence (-) Musculoskeletal: joint swelling (-), joint pain (-), back pain (-) Neuro: weakness (-), numbness (-), headache (-), confusion (-) Skin: Rash (-), lesions (-), dryness (-) Psych: depression (-), suicidal/homicidal ideation (-), feeling of hopelessness (-)  Health Maintenance  Mammogram: 07/06/12 Colonoscopy: 10/2011 Bone Density Scan: n/a Pap Smear: 04/2012 Eye Exam: 04/2012 Vitamin D Level: n/a Lipid Panel: 04/2012   PHYSICAL EXAMINATION:  BP 118/75  Pulse 70  Temp 98.6 F (37 C)  (Oral)  Resp 20  Ht 5\' 7"  (1.702 m)  Wt 232 lb 1.6 oz (105.28 kg)  BMI 36.35 kg/m2 General: Patient is a well appearing female in no acute distress HEENT: PERRLA, sclerae anicteric no conjunctival pallor, MMM Neck: supple, no palpable adenopathy Lungs: clear to auscultation bilaterally, no wheezes, rhonchi, or rales Cardiovascular: regular rate rhythm, S1, S2, no murmurs, rubs or gallops Abdomen: Soft, non-tender, non-distended, normoactive bowel sounds, no HSM Extremities: warm and well perfused, no clubbing, cyanosis, or edema Skin: No rashes or lesions Neuro: Non-focal Bilateral breast exams are performed. Both of her breasts appear normal. There is a surgical scar in the left. No overlying nodularity no changes no masses no nipple discharge. Right breast no masses or nipple discharge. ECOG PERFORMANCE STATUS: 0 - Asymptomatic    LABORATORY DATA: Lab Results  Component Value Date   WBC 4.2 07/22/2012   HGB 13.1 07/22/2012   HCT 37.7 07/22/2012   MCV 95.8 07/22/2012   PLT 170 07/22/2012      Chemistry      Component Value Date/Time   NA 139 07/22/2012 0902   NA 139 01/28/2012 1118   K 3.7 07/22/2012 0902   K 4.5 01/28/2012 1118   CL 108* 07/22/2012 0902   CL 106 01/28/2012 1118   CO2 24 07/22/2012 0902   CO2 29 01/28/2012 1118   BUN 13.0 07/22/2012 0902   BUN 13 01/28/2012 1118   CREATININE 0.7 07/22/2012 0902   CREATININE 0.69 01/28/2012 1118      Component Value Date/Time   CALCIUM 9.2 07/22/2012 0902   CALCIUM 9.3 01/28/2012 1118   ALKPHOS 83 07/22/2012 0902   ALKPHOS 60 01/28/2012 1118   AST 123* 07/22/2012 0902   AST 65* 01/28/2012 1118   ALT 109* 07/22/2012 0902   ALT 64* 01/28/2012 1118   BILITOT 0.66 07/22/2012 0902   BILITOT 0.5 01/28/2012 1118       RADIOGRAPHIC STUDIES:  No results found.  ASSESSMENT: 56 year old female with DCIS of the left breast status post lumpectomy in December 2011 followed by radiation therapy which he completed in February 2012. She was then  begun on tamoxifen in March 2012 overall she is tolerating it well. Patient does have anxiety depression and hot flashes and we have her on Effexor 75 mg daily and that is helping her considerably well.   PLAN: Continue to tamoxifen 20 mg on a daily basis for a total of 5 years. She will continue the Effexor 75 mg daily. She will exercise eat a healthy diet try to maintain a good body weight. I recommended her going to the gym and doing water aerobics since she has had problems  with her back and working out.  Additionally, she will go onto the [employer] website to get healthy diet and recipe recommendations for cancer survivors.    All questions were answered. The patient knows to call the clinic with any problems, questions or concerns. We can certainly see the patient much sooner if necessary.  I spent 15 minutes counseling the patient face to face. The total time spent in the appointment was 30 minutes.   Cherie Ouch Lyn Hollingshead, NP Medical Oncology [facility] Phone: [phone]    07/22/2012, 9:57 AM

## 2012-07-22 NOTE — Telephone Encounter (Signed)
gve the pt her may 2014 appt calendar 

## 2012-07-22 NOTE — Patient Instructions (Addendum)
Doing well.  No sign of recurrence.  We will see you back in 6 months.  Please look at [email or web address] for healthy diet and exercise ideas.

## 2012-08-19 ENCOUNTER — Other Ambulatory Visit: Payer: Self-pay | Admitting: *Deleted

## 2012-08-19 DIAGNOSIS — D0590 Unspecified type of carcinoma in situ of unspecified breast: Secondary | ICD-10-CM

## 2012-08-19 MED ORDER — VENLAFAXINE HCL ER 75 MG PO TB24: 1.0000 | ORAL_TABLET | Freq: Every day | ORAL | Status: DC

## 2012-10-21 ENCOUNTER — Encounter (HOSPITAL_COMMUNITY): Payer: Self-pay

## 2012-10-21 ENCOUNTER — Emergency Department (HOSPITAL_COMMUNITY)
Admission: EM | Admit: 2012-10-21 | Discharge: 2012-10-21 | Disposition: A | Discharge status: Home / Self Care | Payer: Medicaid Other | Source: Home / Self Care | Attending: Family Medicine | Admitting: Family Medicine

## 2012-10-21 DIAGNOSIS — N951 Menopausal and female climacteric states: Secondary | ICD-10-CM

## 2012-10-21 DIAGNOSIS — R748 Abnormal levels of other serum enzymes: Secondary | ICD-10-CM

## 2012-10-21 DIAGNOSIS — B1710 Acute hepatitis C without hepatic coma: Secondary | ICD-10-CM

## 2012-10-21 DIAGNOSIS — G8929 Other chronic pain: Secondary | ICD-10-CM

## 2012-10-21 DIAGNOSIS — I10 Essential (primary) hypertension: Secondary | ICD-10-CM

## 2012-10-21 DIAGNOSIS — J309 Allergic rhinitis, unspecified: Secondary | ICD-10-CM

## 2012-10-21 MED ORDER — TRAMADOL HCL 50 MG PO TABS: 50.0000 mg | ORAL_TABLET | Freq: Three times a day (TID) | ORAL | Status: DC | PRN

## 2012-10-21 MED ORDER — VALSARTAN-HYDROCHLOROTHIAZIDE 160-12.5 MG PO TABS: 1.0000 | ORAL_TABLET | Freq: Every day | ORAL | Status: DC

## 2012-10-21 MED ORDER — BENZONATATE 100 MG PO CAPS: 100.0000 mg | ORAL_CAPSULE | Freq: Three times a day (TID) | ORAL | Status: DC | PRN

## 2012-10-21 NOTE — ED Provider Notes (Signed)
History    CSN: [id number]  Arrival date & time 10/21/12  1127   First MD Initiated Contact with Patient 10/21/12 1227     Chief Complaint  Patient presents with  . Medication Refill  . Wrist Pain    left   HPI Patient is presenting today to followup for chronic medical conditions.  She's had chronic pain for several years.  She had been going to [employer].  The patient reports that she is having pain but it is controlled with current medication regimen that she's been on for quite some time.  She also has hypertension and a history of breast cancer.  The patient reports that she takes Diovan regularly and it has been controlling her blood pressure.  Past Medical History  Diagnosis Date  . Allergy   . Hypertension   . Arthritis   . Cancer     Past Surgical History  Procedure Date  . Thumb surgery     Family History  Problem Relation Age of Onset  . Diabetes Mother   . Diabetes Father     History  Substance Use Topics  . Smoking status: Unknown If Ever Smoked  . Smokeless tobacco: Not on file  . Alcohol Use: No    OB History    Grav Para Term Preterm Abortions TAB SAB Ect Mult Living                 Review of Systems  Musculoskeletal: Positive for back pain, joint swelling and arthralgias.  All other systems reviewed and are negative.   Allergies  Aspirin and Fexofenadine  Home Medications   Current Outpatient Rx  Name  Route  Sig  Dispense  Refill  . BENZONATATE 100 MG PO CAPS   Oral   Take 100 mg by mouth 3 (three) times daily as needed.           Marland Kitchen BENZONATATE 200 MG PO CAPS   Oral   Take 1 capsule (200 mg total) by mouth 3 (three) times daily as needed for cough.   30 capsule   3   . CETIRIZINE HCL 10 MG PO TABS   Oral   Take 10 mg by mouth daily.           Marland Kitchen CLONAZEPAM 0.25 MG PO TBDP   Oral   Take 1 tablet (0.25 mg total) by mouth 2 (two) times daily as needed.   60 tablet   0   . CLONAZEPAM 0.5 MG PO TABS   Oral   Take 0.25 mg  by mouth as needed.           . CYCLOBENZAPRINE HCL 10 MG PO TABS   Oral   Take 10 mg by mouth 3 (three) times daily as needed.           . CYCLOBENZAPRINE HCL 5 MG PO TABS   Oral   Take 1 tablet (5 mg total) by mouth 3 (three) times daily as needed for muscle spasms.   30 tablet   3   . DICLOFENAC SODIUM 75 MG PO TBEC   Oral   Take 75 mg by mouth 2 (two) times daily.           Marland Kitchen DICLOFENAC SODIUM 75 MG PO TBEC   Oral   Take 1 tablet (75 mg total) by mouth 2 (two) times daily.   30 tablet   3   . PREDNISONE 5 MG PO KIT   Oral   Take  1 kit (5 mg total) by mouth daily after breakfast. Prednisone 5 mg 6 day dosepack.  Take as directed.   1 kit   0   . TAMOXIFEN CITRATE 20 MG PO TABS   Oral   Take 1 tablet (20 mg total) by mouth daily.   90 tablet   12   . TRAMADOL HCL 50 MG PO TABS   Oral   Take 50 mg by mouth every 6 (six) hours as needed.           Marland Kitchen TRAMADOL HCL 50 MG PO TABS   Oral   Take 2 tablets (100 mg total) by mouth every 8 (eight) hours as needed for pain.   30 tablet   0   . VALSARTAN-HYDROCHLOROTHIAZIDE 160-12.5 MG PO TABS   Oral   Take 1 tablet by mouth daily.          . VENLAFAXINE HCL ER 75 MG PO TB24   Oral   Take 1 tablet (75 mg total) by mouth daily.   90 each   1     BP 133/77  Pulse 70  Temp 98.2 F (36.8 C) (Oral)  Resp 16  SpO2 100%  Physical Exam  Nursing note and vitals reviewed. Constitutional: She is oriented to person, place, and time. She appears well-developed and well-nourished. No distress.  HENT:  Head: Normocephalic and atraumatic.  Eyes: Conjunctivae normal and EOM are normal. Pupils are equal, round, and reactive to light.  Neck: Normal range of motion. Neck supple. No thyromegaly present.  Cardiovascular: Normal rate, regular rhythm and normal heart sounds.   Pulmonary/Chest: Breath sounds normal. No respiratory distress.  Abdominal: Soft. Bowel sounds are normal.  Musculoskeletal: Normal range of  motion. She exhibits tenderness.  Lymphadenopathy:    She has no cervical adenopathy.  Neurological: She is alert and oriented to person, place, and time.  Skin: Skin is warm and dry.  Psychiatric: She has a normal mood and affect. Her behavior is normal. Judgment and thought content normal.    ED Course  Procedures (including critical care time)  Labs Reviewed - No data to display No results found.  No diagnosis found.  MDM  IMPRESSION  Osteoarthritis  Chronic Pain  Hypertension  History of breast cancer s/p lumpectomy left breast  Chronic cough   Chronic left wrist pain  Suspected prediabetes   RECOMMENDATIONS / PLAN Pt deferred labs until next visit, cmp, cbc, lipids, a1c Refilled meds today The patient has had some elevated blood sugars in the past.  I'm concerned about prediabetes.  I would like to check an A1c to evaluate that..  In addition, as the patient to wear her wrist splint on the left wrist every evening at bedtime and she can take it off in the morning.  This will give some relief to the bones and joints of the wrist and I suspect it can improve her symptoms.   The patient declined to have labs done today.  She says she will followup within the month to have her labs redrawn.  FOLLOW UP 1 month for labs  The patient was given clear instructions to go to ER or return to medical center if symptoms don't improve, worsen or new problems develop.  The patient verbalized understanding.  The patient was told to call to get lab results if they haven't heard anything in the next week.            Cleora Fleet, MD 10/21/12 [phone]

## 2012-10-21 NOTE — ED Notes (Signed)
[facility] client-needs medication refill Complains of pain to left wrist

## 2012-11-18 ENCOUNTER — Emergency Department (INDEPENDENT_AMBULATORY_CARE_PROVIDER_SITE_OTHER)
Admission: EM | Admit: 2012-11-18 | Discharge: 2012-11-18 | Disposition: A | Discharge status: Home / Self Care | Payer: Medicaid Other | Source: Home / Self Care

## 2012-11-18 DIAGNOSIS — D0590 Unspecified type of carcinoma in situ of unspecified breast: Secondary | ICD-10-CM

## 2012-11-18 DIAGNOSIS — F411 Generalized anxiety disorder: Secondary | ICD-10-CM

## 2012-11-18 DIAGNOSIS — B1710 Acute hepatitis C without hepatic coma: Secondary | ICD-10-CM

## 2012-11-18 DIAGNOSIS — S335XXA Sprain of ligaments of lumbar spine, initial encounter: Secondary | ICD-10-CM

## 2012-11-18 DIAGNOSIS — N959 Unspecified menopausal and perimenopausal disorder: Secondary | ICD-10-CM

## 2012-11-18 DIAGNOSIS — I10 Essential (primary) hypertension: Secondary | ICD-10-CM

## 2012-11-18 DIAGNOSIS — M25539 Pain in unspecified wrist: Secondary | ICD-10-CM

## 2012-11-18 LAB — HEMOGLOBIN A1C
Hgb A1c MFr Bld: 6 % — ABNORMAL HIGH (ref <5.7)
Mean Plasma Glucose: 126 mg/dL — ABNORMAL HIGH (ref <117)

## 2012-11-18 LAB — CBC
Hemoglobin: 13.7 g/dL (ref 12.0–15.0)
MCHC: 34.4 g/dL (ref 30.0–36.0)
RBC: 4.31 MIL/uL (ref 3.87–5.11)

## 2012-11-18 LAB — COMPREHENSIVE METABOLIC PANEL
ALT: 101 U/L — ABNORMAL HIGH (ref 0–35)
Alkaline Phosphatase: 77 U/L (ref 39–117)
GFR calc Af Amer: >90 mL/min (ref >90)
Glucose, Bld: 111 mg/dL — ABNORMAL HIGH (ref 70–99)
Potassium: 4.2 mEq/L (ref 3.5–5.1)
Sodium: 139 mEq/L (ref 135–145)
Total Protein: 7.5 g/dL (ref 6.0–8.3)

## 2012-11-18 LAB — LIPID PANEL
LDL Cholesterol: 91 mg/dL (ref 0–99)
Total CHOL/HDL Ratio: 3.3 RATIO
VLDL: 33 mg/dL (ref 0–40)

## 2012-11-18 MED ORDER — VALSARTAN-HYDROCHLOROTHIAZIDE 160-12.5 MG PO TABS: 1.0000 | ORAL_TABLET | Freq: Every day | ORAL | Status: DC

## 2012-11-18 NOTE — ED Notes (Signed)
Blood work only Cbc,cmp,lipid a1c

## 2012-11-27 ENCOUNTER — Emergency Department (INDEPENDENT_AMBULATORY_CARE_PROVIDER_SITE_OTHER): Payer: Medicaid Other

## 2012-11-27 ENCOUNTER — Emergency Department (HOSPITAL_COMMUNITY)
Admission: EM | Admit: 2012-11-27 | Discharge: 2012-11-27 | Disposition: A | Discharge status: Home / Self Care | Payer: Medicaid Other | Source: Home / Self Care

## 2012-11-27 ENCOUNTER — Encounter (HOSPITAL_COMMUNITY): Payer: Self-pay

## 2012-11-27 DIAGNOSIS — M25539 Pain in unspecified wrist: Secondary | ICD-10-CM

## 2012-11-27 DIAGNOSIS — D0590 Unspecified type of carcinoma in situ of unspecified breast: Secondary | ICD-10-CM

## 2012-11-27 DIAGNOSIS — F411 Generalized anxiety disorder: Secondary | ICD-10-CM

## 2012-11-27 MED ORDER — TRAMADOL HCL 50 MG PO TABS: 50.0000 mg | ORAL_TABLET | Freq: Three times a day (TID) | ORAL | Status: DC | PRN

## 2012-11-27 NOTE — ED Notes (Signed)
Patient here for follow up- left wrist pain Does not feel any better Actually feels worse

## 2012-11-27 NOTE — ED Provider Notes (Signed)
History     CSN: [id number]  Arrival date & time 11/27/12  1001   Chief Complaint  Patient presents with  . Wrist Pain    (Consider location/radiation/quality/duration/timing/severity/associated sxs/prior treatment) HPI Patient is 57 year old female who presents with one month history of left wrist Spansules see that with swelling and decreased mobility. She denies similar events in the past and and reports that symptoms are worse when the weather is cold. Patient denies erythema or warmth to touch, but she reports tenderness to touch. Patient denies noticing any lumps or bumps in any joint area through the body. She explains she has osteoarthritis. Patient denies fevers, chills, other systemic concerns. Patient also denies recent trauma to the left wrist. Patient explains she has tried over-the-counter ibuprofen with no significant relief. She describes pain as sharp and intermittent in nature, 3/10 in severity when present, located around the left crease area, nonradiating.  Past Medical History  Diagnosis Date  . Allergy   . Hypertension   . Arthritis   . Cancer     Past Surgical History  Procedure Laterality Date  . Thumb surgery      Family History  Problem Relation Age of Onset  . Diabetes Mother   . Diabetes Father     History  Substance Use Topics  . Smoking status: Unknown If Ever Smoked  . Smokeless tobacco: Not on file  . Alcohol Use: No    OB History   Grav Para Term Preterm Abortions TAB SAB Ect Mult Living                  Review of Systems  Constitutional: Negative for fever, chills, diaphoresis, activity change, appetite change and fatigue.  HENT: Negative for ear pain, nosebleeds, congestion, facial swelling, rhinorrhea, neck pain, neck stiffness and ear discharge.   Eyes: Negative for pain, discharge, redness, itching and visual disturbance.  Respiratory: Negative for cough, choking, chest tightness, shortness of breath, wheezing and stridor.    Cardiovascular: Negative for chest pain, palpitations and leg swelling.  Gastrointestinal: Negative for abdominal distention.  Genitourinary: Negative for dysuria, urgency, frequency, hematuria, flank pain, decreased urine volume, difficulty urinating and dyspareunia.  Musculoskeletal: Negative for back pain, arthralgias and gait problem.  Neurological: Negative for dizziness, tremors, seizures, syncope, facial asymmetry, speech difficulty, weakness, light-headedness, numbness and headaches.  Hematological: Negative for adenopathy. Does not bruise/bleed easily.  Psychiatric/Behavioral: Negative for hallucinations, behavioral problems, confusion, dysphoric mood, decreased concentration and agitation.   Allergies  Aspirin and Fexofenadine  Home Medications   Current Outpatient Rx  Name  Route  Sig  Dispense  Refill  . benzonatate (TESSALON) 100 MG capsule   Oral   Take 1 capsule (100 mg total) by mouth 3 (three) times daily as needed.   20 capsule   0   . cetirizine (ZYRTEC) 10 MG tablet   Oral   Take 10 mg by mouth daily.           . cyclobenzaprine (FLEXERIL) 5 MG tablet   Oral   Take 1 tablet (5 mg total) by mouth 3 (three) times daily as needed for muscle spasms.   30 tablet   3   . diclofenac (VOLTAREN) 75 MG EC tablet   Oral   Take 1 tablet (75 mg total) by mouth 2 (two) times daily.   30 tablet   3   . tamoxifen (NOLVADEX) 20 MG tablet   Oral   Take 1 tablet (20 mg total) by mouth daily.  90 tablet   12   . traMADol (ULTRAM) 50 MG tablet   Oral   Take 1 tablet (50 mg total) by mouth every 8 (eight) hours as needed for pain.   30 tablet   0   . valsartan-hydrochlorothiazide (DIOVAN HCT) 160-12.5 MG per tablet   Oral   Take 1 tablet by mouth daily.   30 tablet   2   . valsartan-hydrochlorothiazide (DIOVAN-HCT) 160-12.5 MG per tablet   Oral   Take 1 tablet by mouth daily.   30 tablet   4   . Venlafaxine HCl 75 MG TB24   Oral   Take 1 tablet (75 mg  total) by mouth daily.   90 each   1     BP 148/89  Pulse 86  Temp(Src) 98.3 F (36.8 C) (Oral)  SpO2 100%  Physical Exam Physical Exam  Constitutional: Appears well-developed and well-nourished. No distress.  HENT: Normocephalic. External right and left ear normal. Oropharynx is clear and moist.  Eyes: Conjunctivae and EOM are normal. PERRLA, no scleral icterus.  Neck: Normal ROM. Neck supple. No JVD. No tracheal deviation. No thyromegaly.  CVS: RRR, S1/S2 +, no murmurs, no gallops, no carotid bruit.  Pulmonary: Effort and breath sounds normal, no stridor, rhonchi, wheezes, rales.  Abdominal: Soft. BS +,  no distension, tenderness, rebound or guarding.  Musculoskeletal: Normal range of motion. Left wrist slightly more swollen compared to right wrist, no erythema, no tenderness to palpation noted, full range of motion experienced. No joint swelling in right or left extremities noted appear  Lymphadenopathy: No lymphadenopathy noted, cervical, inguinal. Neuro: Alert. Normal reflexes, muscle tone coordination. No cranial nerve deficit. Skin: Skin is warm and dry. No rash noted. Not diaphoretic. No erythema. No pallor.  Psychiatric: Normal mood and affect. Behavior, judgment, thought content normal.    ED Course  Procedures  Left wrist x-ray --> no acute abnormalities noted, no fractures and no dislocation    Labs Reviewed - No data to display No results found.  Wrist pain, left - Secondary to progressive osteoarthritis, exercises recommended and patient verbalized understanding - Warm compresses or cold compresses recommended whichever relieves the pain - Anti-inflammatory medication tramadol will be prescribed for symptomatic management of pain - Patient advised to come back and see [place] if her symptoms do not improve or get worse in the next 1-2 weeks  MDM  Left wrist pain secondary to progressive osteoarthritis         Dorothea Ogle, MD 11/27/12 1112

## 2013-01-26 ENCOUNTER — Encounter: Payer: Self-pay | Admitting: Oncology

## 2013-01-26 ENCOUNTER — Telehealth: Payer: Self-pay | Admitting: *Deleted

## 2013-01-26 ENCOUNTER — Ambulatory Visit (HOSPITAL_BASED_OUTPATIENT_CLINIC_OR_DEPARTMENT_OTHER): Payer: Medicaid Other | Admitting: Oncology

## 2013-01-26 ENCOUNTER — Other Ambulatory Visit (HOSPITAL_BASED_OUTPATIENT_CLINIC_OR_DEPARTMENT_OTHER): Payer: Medicaid Other | Admitting: Lab

## 2013-01-26 VITALS — BP 141/81 | HR 74 | Temp 98.4°F | Resp 20 | Ht 67.0 in | Wt 240.1 lb

## 2013-01-26 DIAGNOSIS — D0512 Intraductal carcinoma in situ of left breast: Secondary | ICD-10-CM

## 2013-01-26 DIAGNOSIS — F341 Dysthymic disorder: Secondary | ICD-10-CM

## 2013-01-26 DIAGNOSIS — D0592 Unspecified type of carcinoma in situ of left breast: Secondary | ICD-10-CM

## 2013-01-26 DIAGNOSIS — D0590 Unspecified type of carcinoma in situ of unspecified breast: Secondary | ICD-10-CM

## 2013-01-26 DIAGNOSIS — D0510 Intraductal carcinoma in situ of unspecified breast: Secondary | ICD-10-CM

## 2013-01-26 DIAGNOSIS — Z170 Estrogen receptor positive status [ER+]: Secondary | ICD-10-CM

## 2013-01-26 LAB — CBC WITH DIFFERENTIAL/PLATELET
BASO%: 0.4 % (ref 0.0–2.0)
EOS%: 1.1 % (ref 0.0–7.0)
MCH: 32.9 pg (ref 25.1–34.0)
MCHC: 34.5 g/dL (ref 31.5–36.0)
RBC: 3.95 10*6/uL (ref 3.70–5.45)
RDW: 13.4 % (ref 11.2–14.5)
lymph#: 1.7 10*3/uL (ref 0.9–3.3)

## 2013-01-26 LAB — COMPREHENSIVE METABOLIC PANEL (CC13)
ALT: 84 U/L — ABNORMAL HIGH (ref 0–55)
AST: 100 U/L — ABNORMAL HIGH (ref 5–34)
Albumin: 2.9 g/dL — ABNORMAL LOW (ref 3.5–5.0)
Alkaline Phosphatase: 97 U/L (ref 40–150)
Calcium: 8.6 mg/dL (ref 8.4–10.4)
Chloride: 108 mEq/L — ABNORMAL HIGH (ref 98–107)
Potassium: 3.9 mEq/L (ref 3.5–5.1)

## 2013-01-26 MED ORDER — VENLAFAXINE HCL ER 75 MG PO TB24: 1.0000 | ORAL_TABLET | Freq: Every day | ORAL | Status: DC

## 2013-01-26 MED ORDER — TAMOXIFEN CITRATE 20 MG PO TABS: 20.0000 mg | ORAL_TABLET | Freq: Every day | ORAL | Status: DC

## 2013-01-26 NOTE — Telephone Encounter (Signed)
appts made and printed...td 

## 2013-01-26 NOTE — Progress Notes (Signed)
OFFICE PROGRESS NOTE  CC Dr. Claud Kelp Dr. Leitha Bleak, MD [address] 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  #2 she then received radiation therapy to the left breast from 09/21/2010 to 11/09/2010.  #3 she was then started on tamoxifen 20 mg daily in March 2012.  CURRENT THERAPY: Tamoxifen 20 mg daily since March 2012.  INTERVAL HISTORY: [place] 57 y.o. female returns for followup visit today.  She continues to have hot flashes.  She is taking Effexor for this.  She is tolerating the side effects of the Tamoxifen and is taking her medication every day.  She has had some weight gain, and has tried exercise, but had difficulty with back pain after going to the gym recently.   MEDICAL HISTORY: Past Medical History  Diagnosis Date  . Allergy   . Hypertension   . Arthritis   . Cancer     ALLERGIES:  is allergic to aspirin and fexofenadine.  MEDICATIONS:  Current Outpatient Prescriptions  Medication Sig Dispense Refill  . cetirizine (ZYRTEC) 10 MG tablet Take 10 mg by mouth daily.        . cyclobenzaprine (FLEXERIL) 5 MG tablet Take 1 tablet (5 mg total) by mouth 3 (three) times daily as needed for muscle spasms.  30 tablet  3  . diclofenac (VOLTAREN) 75 MG EC tablet Take 1 tablet (75 mg total) by mouth 2 (two) times daily.  30 tablet  3  . tamoxifen (NOLVADEX) 20 MG tablet Take 1 tablet (20 mg total) by mouth daily.  90 tablet  12  . traMADol (ULTRAM) 50 MG tablet Take 1 tablet (50 mg total) by mouth every 8 (eight) hours as needed for pain.  45 tablet  1  . valsartan-hydrochlorothiazide (DIOVAN HCT) 160-12.5 MG per tablet Take 1 tablet by mouth daily.  30 tablet  2  . valsartan-hydrochlorothiazide (DIOVAN-HCT) 160-12.5 MG per  tablet Take 1 tablet by mouth daily.  30 tablet  4  . Venlafaxine HCl 75 MG TB24 Take 1 tablet (75 mg total) by mouth daily.  90 each  1  . benzonatate (TESSALON) 100 MG capsule Take 1 capsule (100 mg total) by mouth 3 (three) times daily as needed.  20 capsule  0  . [DISCONTINUED] clonazePAM (KLONOPIN) 0.25 MG disintegrating tablet Take 1 tablet (0.25 mg total) by mouth 2 (two) times daily as needed.  60 tablet  0   No current facility-administered medications for this visit.    SURGICAL HISTORY:  Past Surgical History  Procedure Laterality Date  . Thumb surgery      REVIEW OF SYSTEMS:  General: fatigue (-), night sweats (-), fever (-), pain (-) Lymph: palpable nodes (-) HEENT: vision changes (-), mucositis (-), gum bleeding (-), epistaxis (-) Cardiovascular: chest pain (-), palpitations (-) Pulmonary: shortness of breath (-), dyspnea on exertion (-), cough (-), hemoptysis (-) GI:  Early satiety (-), melena (-), dysphagia (-), nausea/vomiting (-), diarrhea (-) GU: dysuria (-), hematuria (-), incontinence (-) Musculoskeletal: joint swelling (-), joint pain (-), back pain (-) Neuro: weakness (-), numbness (-), headache (-), confusion (-) Skin: Rash (-), lesions (-), dryness (-) Psych: depression (-), suicidal/homicidal ideation (-), feeling of hopelessness (-)  Health Maintenance  Mammogram: 07/06/12 Colonoscopy: 10/2011 Bone Density Scan: n/a Pap Smear: 04/2012 Eye Exam: 04/2012  Vitamin D Level: n/a Lipid Panel: 04/2012   PHYSICAL EXAMINATION:  BP 141/81  Pulse 74  Temp(Src) 98.4 F (36.9 C) (Oral)  Resp 20  Ht 5\' 7"  (1.702 m)  Wt 240 lb 1.6 oz (108.909 kg)  BMI 37.6 kg/m2 General: Patient is a well appearing female in no acute distress HEENT: PERRLA, sclerae anicteric no conjunctival pallor, MMM Neck: supple, no palpable adenopathy Lungs: clear to auscultation bilaterally, no wheezes, rhonchi, or rales Cardiovascular: regular rate rhythm, S1, S2, no murmurs, rubs or  gallops Abdomen: Soft, non-tender, non-distended, normoactive bowel sounds, no HSM Extremities: warm and well perfused, no clubbing, cyanosis, or edema Skin: No rashes or lesions Neuro: Non-focal Bilateral breast exams are performed. Both of her breasts appear normal. There is a surgical scar in the left. No overlying nodularity no changes no masses no nipple discharge. Right breast no masses or nipple discharge. ECOG PERFORMANCE STATUS: 0 - Asymptomatic    LABORATORY DATA: Lab Results  Component Value Date   WBC 4.5 01/26/2013   HGB 13.0 01/26/2013   HCT 37.7 01/26/2013   MCV 95.4 01/26/2013   PLT 153 01/26/2013      Chemistry      Component Value Date/Time   NA 140 01/26/2013 0914   NA 139 11/18/2012 1030   K 3.9 01/26/2013 0914   K 4.2 11/18/2012 1030   CL 108* 01/26/2013 0914   CL 104 11/18/2012 1030   CO2 23 01/26/2013 0914   CO2 26 11/18/2012 1030   BUN 9.9 01/26/2013 0914   BUN 11 11/18/2012 1030   CREATININE 0.7 01/26/2013 0914   CREATININE 0.63 11/18/2012 1030      Component Value Date/Time   CALCIUM 8.6 01/26/2013 0914   CALCIUM 9.6 11/18/2012 1030   ALKPHOS 97 01/26/2013 0914   ALKPHOS 77 11/18/2012 1030   AST 100* 01/26/2013 0914   AST 120* 11/18/2012 1030   ALT 84* 01/26/2013 0914   ALT 101* 11/18/2012 1030   BILITOT 0.79 01/26/2013 0914   BILITOT 0.6 11/18/2012 1030       RADIOGRAPHIC STUDIES:  No results found.  ASSESSMENT: 57 year old female with   #1 DCIS of the left breast status post lumpectomy in December 2011 followed by radiation therapy which he completed in February 2012. She was then begun on tamoxifen in March 2012 overall she is tolerating it well.   #2 Patient does have anxiety depression and hot flashes and we have her on Effexor 75 mg daily and that is helping her considerably well.   PLAN:   #1 Continue to tamoxifen 20 mg on a daily basis for a total of 5 years.   #2 She will continue the Effexor 75 mg daily.   #3 She will exercise eat a healthy diet try  to maintain a good body weight. I recommended her going to the gym and doing water aerobics since she has had problems with her back and working out.    #4 I will see patient back in 8 months time  All questions were answered. The patient knows to call the clinic with any problems, questions or concerns. We can certainly see the patient much sooner if necessary.  I spent 25 minutes counseling the patient face to face. The total time spent in the appointment was 30 minutes.  Drue Second, MD Medical/Oncology [facility] [phone] (beeper) [phone] (Office)  01/26/2013, 10:38 AM

## 2013-01-26 NOTE — Patient Instructions (Addendum)
You continue to do Will. Continue tamoxifen 20 mg daily. We will plan on doing a total of 5 years.  Continue Effexor 75 mg daily.  I will see you back in 8 months time.

## 2013-04-28 ENCOUNTER — Other Ambulatory Visit (HOSPITAL_COMMUNITY): Payer: Self-pay | Admitting: Internal Medicine

## 2013-04-30 ENCOUNTER — Other Ambulatory Visit (HOSPITAL_COMMUNITY): Payer: Self-pay | Admitting: Internal Medicine

## 2013-04-30 DIAGNOSIS — M25511 Pain in right shoulder: Secondary | ICD-10-CM

## 2013-05-01 ENCOUNTER — Ambulatory Visit (HOSPITAL_COMMUNITY)
Admission: RE | Admit: 2013-05-01 | Discharge: 2013-05-01 | Disposition: A | Discharge status: Home / Self Care | Payer: Medicaid Other | Source: Ambulatory Visit | Attending: Internal Medicine | Admitting: Internal Medicine

## 2013-05-01 DIAGNOSIS — R7989 Other specified abnormal findings of blood chemistry: Secondary | ICD-10-CM | POA: Insufficient documentation

## 2013-05-01 DIAGNOSIS — K769 Liver disease, unspecified: Secondary | ICD-10-CM | POA: Insufficient documentation

## 2013-05-01 DIAGNOSIS — N281 Cyst of kidney, acquired: Secondary | ICD-10-CM | POA: Insufficient documentation

## 2013-05-01 DIAGNOSIS — K829 Disease of gallbladder, unspecified: Secondary | ICD-10-CM | POA: Insufficient documentation

## 2013-05-01 DIAGNOSIS — D0590 Unspecified type of carcinoma in situ of unspecified breast: Secondary | ICD-10-CM | POA: Insufficient documentation

## 2013-05-01 DIAGNOSIS — M25519 Pain in unspecified shoulder: Secondary | ICD-10-CM | POA: Insufficient documentation

## 2013-05-01 DIAGNOSIS — M25511 Pain in right shoulder: Secondary | ICD-10-CM

## 2013-06-02 ENCOUNTER — Other Ambulatory Visit: Payer: Self-pay | Admitting: Oncology

## 2013-06-02 DIAGNOSIS — Z853 Personal history of malignant neoplasm of breast: Secondary | ICD-10-CM

## 2013-06-02 DIAGNOSIS — Z9889 Other specified postprocedural states: Secondary | ICD-10-CM

## 2013-07-13 ENCOUNTER — Ambulatory Visit
Admission: RE | Admit: 2013-07-13 | Discharge: 2013-07-13 | Disposition: A | Discharge status: Home / Self Care | Payer: Medicaid Other | Source: Ambulatory Visit | Attending: Oncology | Admitting: Oncology

## 2013-07-13 DIAGNOSIS — Z853 Personal history of malignant neoplasm of breast: Secondary | ICD-10-CM

## 2013-07-13 DIAGNOSIS — Z9889 Other specified postprocedural states: Secondary | ICD-10-CM

## 2013-09-28 ENCOUNTER — Other Ambulatory Visit: Payer: Medicaid Other

## 2013-09-28 ENCOUNTER — Ambulatory Visit: Payer: Medicaid Other | Admitting: Oncology

## 2013-09-28 ENCOUNTER — Telehealth: Payer: Self-pay | Admitting: Oncology

## 2013-09-28 NOTE — Telephone Encounter (Signed)
, °

## 2013-10-14 ENCOUNTER — Telehealth: Payer: Self-pay | Admitting: Oncology

## 2013-10-14 NOTE — Telephone Encounter (Signed)
Pt called and r/s appt from 2/4 to 2/5 lab and ML

## 2013-10-21 ENCOUNTER — Other Ambulatory Visit: Payer: Medicaid Other

## 2013-10-21 ENCOUNTER — Ambulatory Visit: Payer: Medicaid Other | Admitting: Adult Health

## 2013-10-22 ENCOUNTER — Telehealth: Payer: Self-pay | Admitting: Oncology

## 2013-10-22 ENCOUNTER — Other Ambulatory Visit (HOSPITAL_BASED_OUTPATIENT_CLINIC_OR_DEPARTMENT_OTHER): Payer: Medicaid Other

## 2013-10-22 ENCOUNTER — Encounter: Payer: Self-pay | Admitting: Adult Health

## 2013-10-22 ENCOUNTER — Ambulatory Visit (HOSPITAL_BASED_OUTPATIENT_CLINIC_OR_DEPARTMENT_OTHER): Payer: Medicaid Other | Admitting: Adult Health

## 2013-10-22 VITALS — BP 132/73 | HR 88 | Temp 98.4°F | Resp 18 | Wt 247.9 lb

## 2013-10-22 DIAGNOSIS — D0590 Unspecified type of carcinoma in situ of unspecified breast: Secondary | ICD-10-CM

## 2013-10-22 DIAGNOSIS — F3289 Other specified depressive episodes: Secondary | ICD-10-CM

## 2013-10-22 DIAGNOSIS — D0510 Intraductal carcinoma in situ of unspecified breast: Secondary | ICD-10-CM

## 2013-10-22 DIAGNOSIS — F329 Major depressive disorder, single episode, unspecified: Secondary | ICD-10-CM

## 2013-10-22 DIAGNOSIS — F411 Generalized anxiety disorder: Secondary | ICD-10-CM

## 2013-10-22 DIAGNOSIS — I10 Essential (primary) hypertension: Secondary | ICD-10-CM

## 2013-10-22 DIAGNOSIS — R232 Flushing: Secondary | ICD-10-CM

## 2013-10-22 DIAGNOSIS — Z170 Estrogen receptor positive status [ER+]: Secondary | ICD-10-CM

## 2013-10-22 LAB — COMPREHENSIVE METABOLIC PANEL (CC13)
ALBUMIN: 3.1 g/dL — AB (ref 3.5–5.0)
ALT: 78 U/L — ABNORMAL HIGH (ref 0–55)
AST: 104 U/L — AB (ref 5–34)
Alkaline Phosphatase: 100 U/L (ref 40–150)
Anion Gap: 9 mEq/L (ref 3–11)
BUN: 11.3 mg/dL (ref 7.0–26.0)
CALCIUM: 9.5 mg/dL (ref 8.4–10.4)
CHLORIDE: 109 meq/L (ref 98–109)
CO2: 24 mEq/L (ref 22–29)
Creatinine: 0.9 mg/dL (ref 0.6–1.1)
Glucose: 124 mg/dl (ref 70–140)
POTASSIUM: 3.8 meq/L (ref 3.5–5.1)
SODIUM: 142 meq/L (ref 136–145)
TOTAL PROTEIN: 7 g/dL (ref 6.4–8.3)
Total Bilirubin: 0.96 mg/dL (ref 0.20–1.20)

## 2013-10-22 LAB — CBC WITH DIFFERENTIAL/PLATELET
BASO%: 0.7 % (ref 0.0–2.0)
Basophils Absolute: 0 10*3/uL (ref 0.0–0.1)
EOS%: 0.7 % (ref 0.0–7.0)
Eosinophils Absolute: 0 10*3/uL (ref 0.0–0.5)
HEMATOCRIT: 36.4 % (ref 34.8–46.6)
HGB: 12.5 g/dL (ref 11.6–15.9)
LYMPH%: 39.9 % (ref 14.0–49.7)
MCH: 33.5 pg (ref 25.1–34.0)
MCHC: 34.2 g/dL (ref 31.5–36.0)
MCV: 97.8 fL (ref 79.5–101.0)
MONO#: 0.5 10*3/uL (ref 0.1–0.9)
MONO%: 10.4 % (ref 0.0–14.0)
NEUT#: 2.1 10*3/uL (ref 1.5–6.5)
NEUT%: 48.3 % (ref 38.4–76.8)
PLATELETS: 154 10*3/uL (ref 145–400)
RBC: 3.72 10*6/uL (ref 3.70–5.45)
RDW: 13.9 % (ref 11.2–14.5)
WBC: 4.4 10*3/uL (ref 3.9–10.3)
lymph#: 1.7 10*3/uL (ref 0.9–3.3)

## 2013-10-22 NOTE — Progress Notes (Signed)
OFFICE PROGRESS NOTE  CC Dr. Fanny Skates Dr. 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  #2 she then received radiation therapy to the left breast from 09/21/2010 to 11/09/2010.  #3 she was then started on tamoxifen 20 mg daily in March 2012.  CURRENT THERAPY: Tamoxifen 20 mg daily since March 2012.  INTERVAL HISTORY: [place] 58 y.o. female returns for followup visit today for her h/o low grade DCIS.  She is doing well.  She is taking Tamoxifen daily and tolerating it moderately well.  She denies any new pain, fevers, night sweats, unintentional weight loss or any other concerns.  Her health maintenance was updated below.    MEDICAL HISTORY: Past Medical History  Diagnosis Date  . Allergy   . Hypertension   . Arthritis   . Cancer     ALLERGIES:  is allergic to aspirin and fexofenadine.  MEDICATIONS:  Current Outpatient Prescriptions  Medication Sig Dispense Refill  . benzonatate (TESSALON) 100 MG capsule Take 1 capsule (100 mg total) by mouth 3 (three) times daily as needed.  20 capsule  0  . cetirizine (ZYRTEC) 10 MG tablet Take 10 mg by mouth daily.        . cyclobenzaprine (FLEXERIL) 5 MG tablet Take 1 tablet (5 mg total) by mouth 3 (three) times daily as needed for muscle spasms.  30 tablet  3  . diclofenac (VOLTAREN) 75 MG EC tablet Take 1 tablet (75 mg total) by mouth 2 (two) times daily.  30 tablet  3  . tamoxifen (NOLVADEX) 20 MG tablet Take 1 tablet (20 mg total) by mouth daily.  90 tablet  12  . traMADol (ULTRAM) 50 MG tablet Take 1 tablet (50 mg total) by mouth every 8 (eight) hours as needed for pain.  45 tablet  1  . valsartan-hydrochlorothiazide (DIOVAN HCT) 160-12.5 MG per tablet Take 1 tablet  by mouth daily.  30 tablet  2  . Venlafaxine HCl 75 MG TB24 Take 1 tablet (75 mg total) by mouth daily.  90 each  6  . [DISCONTINUED] clonazePAM (KLONOPIN) 0.25 MG disintegrating tablet Take 1 tablet (0.25 mg total) by mouth 2 (two) times daily as needed.  60 tablet  0   No current facility-administered medications for this visit.    SURGICAL HISTORY:  Past Surgical History  Procedure Laterality Date  . Thumb surgery      REVIEW OF SYSTEMS:  A 10 point review of systems was conducted and is otherwise negative except for what is noted above.     Health Maintenance  Mammogram: 07/13/13 Colonoscopy: 10/2011 Bone Density Scan: n/a Pap Smear: 04/2012 Eye Exam: 04/2012 Vitamin D Level: n/a Lipid Panel: 11/2012   PHYSICAL EXAMINATION:  BP 132/73  Pulse 88  Temp(Src) 98.4 F (36.9 C) (Oral)  Resp 18  Wt 247 lb 14.4 oz (112.447 kg) GENERAL: Patient is a well appearing female in no acute distress HEENT:  Sclerae anicteric.  Oropharynx clear and moist. No ulcerations or evidence of oropharyngeal candidiasis. Neck is supple.  NODES:  No cervical, supraclavicular, or axillary lymphadenopathy palpated.  BREAST EXAM:  Bilateral breast exams are performed. Both of her breasts appear normal. There is a surgical scar in the left. No overlying nodularity no changes no masses no  nipple discharge. Right breast no masses or nipple discharge. LUNGS:  Clear to auscultation bilaterally.  No wheezes or rhonchi. HEART:  Regular rate and rhythm. No murmur appreciated. ABDOMEN:  Soft, nontender.  Positive, normoactive bowel sounds. No organomegaly palpated. MSK:  No focal spinal tenderness to palpation. Full range of motion bilaterally in the upper extremities. EXTREMITIES:  No peripheral edema.   SKIN:  Clear with no obvious rashes or skin changes. No nail dyscrasia. NEURO:  Nonfocal. Well oriented.  Appropriate affect. ECOG PERFORMANCE STATUS: 0 - Asymptomatic    LABORATORY DATA: Lab Results   Component Value Date   WBC 4.4 10/22/2013   HGB 12.5 10/22/2013   HCT 36.4 10/22/2013   MCV 97.8 10/22/2013   PLT 154 10/22/2013      Chemistry      Component Value Date/Time   NA 142 10/22/2013 1358   NA 139 11/18/2012 1030   K 3.8 10/22/2013 1358   K 4.2 11/18/2012 1030   CL 108* 01/26/2013 0914   CL 104 11/18/2012 1030   CO2 24 10/22/2013 1358   CO2 26 11/18/2012 1030   BUN 11.3 10/22/2013 1358   BUN 11 11/18/2012 1030   CREATININE 0.9 10/22/2013 1358   CREATININE 0.63 11/18/2012 1030      Component Value Date/Time   CALCIUM 9.5 10/22/2013 1358   CALCIUM 9.6 11/18/2012 1030   ALKPHOS 100 10/22/2013 1358   ALKPHOS 77 11/18/2012 1030   AST 104* 10/22/2013 1358   AST 120* 11/18/2012 1030   ALT 78* 10/22/2013 1358   ALT 101* 11/18/2012 1030   BILITOT 0.96 10/22/2013 1358   BILITOT 0.6 11/18/2012 1030       RADIOGRAPHIC STUDIES:  No results found.  ASSESSMENT: 58 year old female with   #1 DCIS of the left breast status post lumpectomy in December 2011 followed by radiation therapy which he completed in February 2012. She was then begun on tamoxifen in March 2012 overall she is tolerating it well.   #2 Patient does have anxiety depression and hot flashes and we have her on Effexor 75 mg daily and that is helping her considerably well.   PLAN:   #1 Patient is doing well today.  She has no sign of recurrence.  She will continue Tamoxifen daily.    #2 She will continue the Effexor 75 mg daily.   #3 We updated her health maintenance.  I recommended she have an eye exam and we discussed diet, exercise, and survivorship.    #4 The patient will return in 6 months for labs along with follow up.    All questions were answered. The patient knows to call the clinic with any problems, questions or concerns. We can certainly see the patient much sooner if necessary.  I spent 25 minutes counseling the patient face to face. The total time spent in the appointment was 30 minutes.  Minette Headland, [place] [phone] 10/24/2013, 2:51 PM

## 2013-10-22 NOTE — Telephone Encounter (Signed)
, °

## 2013-12-13 ENCOUNTER — Emergency Department (HOSPITAL_COMMUNITY): Payer: Medicaid Other

## 2013-12-13 ENCOUNTER — Encounter (HOSPITAL_COMMUNITY): Payer: Self-pay | Admitting: Emergency Medicine

## 2013-12-13 ENCOUNTER — Emergency Department (HOSPITAL_COMMUNITY)
Admission: EM | Admit: 2013-12-13 | Discharge: 2013-12-13 | Disposition: A | Discharge status: Home / Self Care | Payer: Medicaid Other | Attending: Emergency Medicine | Admitting: Emergency Medicine

## 2013-12-13 DIAGNOSIS — Z791 Long term (current) use of non-steroidal anti-inflammatories (NSAID): Secondary | ICD-10-CM | POA: Insufficient documentation

## 2013-12-13 DIAGNOSIS — Z853 Personal history of malignant neoplasm of breast: Secondary | ICD-10-CM | POA: Insufficient documentation

## 2013-12-13 DIAGNOSIS — J069 Acute upper respiratory infection, unspecified: Secondary | ICD-10-CM | POA: Insufficient documentation

## 2013-12-13 DIAGNOSIS — M129 Arthropathy, unspecified: Secondary | ICD-10-CM | POA: Insufficient documentation

## 2013-12-13 DIAGNOSIS — Z79899 Other long term (current) drug therapy: Secondary | ICD-10-CM | POA: Insufficient documentation

## 2013-12-13 DIAGNOSIS — I10 Essential (primary) hypertension: Secondary | ICD-10-CM | POA: Insufficient documentation

## 2013-12-13 LAB — CBC WITH DIFFERENTIAL/PLATELET
BASOS PCT: 0 % (ref 0–1)
Basophils Absolute: 0 10*3/uL (ref 0.0–0.1)
Eosinophils Absolute: 0 10*3/uL (ref 0.0–0.7)
Eosinophils Relative: 0 % (ref 0–5)
HCT: 37.9 % (ref 36.0–46.0)
Hemoglobin: 13.3 g/dL (ref 12.0–15.0)
LYMPHS PCT: 56 % — AB (ref 12–46)
Lymphs Abs: 1.9 10*3/uL (ref 0.7–4.0)
MCH: 33.3 pg (ref 26.0–34.0)
MCHC: 35.1 g/dL (ref 30.0–36.0)
MCV: 94.8 fL (ref 78.0–100.0)
Monocytes Absolute: 0.4 10*3/uL (ref 0.1–1.0)
Monocytes Relative: 13 % — ABNORMAL HIGH (ref 3–12)
NEUTROS ABS: 1 10*3/uL — AB (ref 1.7–7.7)
Neutrophils Relative %: 31 % — ABNORMAL LOW (ref 43–77)
Platelets: 120 10*3/uL — ABNORMAL LOW (ref 150–400)
RBC: 4 MIL/uL (ref 3.87–5.11)
RDW: 13.6 % (ref 11.5–15.5)
WBC: 3.4 10*3/uL — AB (ref 4.0–10.5)

## 2013-12-13 LAB — URINALYSIS, ROUTINE W REFLEX MICROSCOPIC
Bilirubin Urine: NEGATIVE
GLUCOSE, UA: NEGATIVE mg/dL
HGB URINE DIPSTICK: NEGATIVE
Ketones, ur: NEGATIVE mg/dL
Leukocytes, UA: NEGATIVE
Nitrite: NEGATIVE
PROTEIN: NEGATIVE mg/dL
Specific Gravity, Urine: 1.015 (ref 1.005–1.030)
UROBILINOGEN UA: 1 mg/dL (ref 0.0–1.0)
pH: 6.5 (ref 5.0–8.0)

## 2013-12-13 LAB — BASIC METABOLIC PANEL
BUN: 11 mg/dL (ref 6–23)
CO2: 22 meq/L (ref 19–32)
Calcium: 8.5 mg/dL (ref 8.4–10.5)
Chloride: 104 mEq/L (ref 96–112)
Creatinine, Ser: 0.71 mg/dL (ref 0.50–1.10)
GFR calc Af Amer: >90 mL/min (ref >90)
GFR calc non Af Amer: >90 mL/min (ref >90)
Glucose, Bld: 113 mg/dL — ABNORMAL HIGH (ref 70–99)
Potassium: 3.9 mEq/L (ref 3.7–5.3)
SODIUM: 138 meq/L (ref 137–147)

## 2013-12-13 MED ORDER — HYDROCODONE-ACETAMINOPHEN 5-325 MG PO TABS: 1.0000 | ORAL_TABLET | ORAL | Status: DC | PRN

## 2013-12-13 MED ORDER — SODIUM CHLORIDE 0.9 % IV BOLUS (SEPSIS)
1000.0000 mL | Freq: Once | INTRAVENOUS | Status: AC
Start: 2013-12-13 — End: 2013-12-13
  Administered 2013-12-13: 1000 mL via INTRAVENOUS

## 2013-12-13 MED ORDER — KETOROLAC TROMETHAMINE 30 MG/ML IJ SOLN
30.0000 mg | Freq: Once | INTRAMUSCULAR | Status: AC
  Administered 2013-12-13: 30 mg via INTRAVENOUS
  Filled 2013-12-13: qty 1

## 2013-12-13 MED ORDER — ONDANSETRON HCL 4 MG PO TABS: 4.0000 mg | ORAL_TABLET | Freq: Four times a day (QID) | ORAL | Status: DC

## 2013-12-13 MED ORDER — AZITHROMYCIN 250 MG PO TABS: 250.0000 mg | ORAL_TABLET | Freq: Every day | ORAL | Status: DC

## 2013-12-13 NOTE — Discharge Instructions (Signed)

## 2013-12-13 NOTE — ED Provider Notes (Signed)
Medical screening examination/treatment/procedure(s) were performed by non-physician practitioner and as supervising physician I was immediately available for consultation/collaboration.  Dakin Madani T Zuri Bradway, MD 12/13/13 1506 

## 2013-12-13 NOTE — ED Notes (Signed)
Patient reports that she had had generalized chills and aches to all her joints since Friday. Has only taken alka selter cold for the pain

## 2013-12-13 NOTE — ED Notes (Signed)
Pt. unable to urinate at this time. Nurses was notified

## 2013-12-13 NOTE — ED Notes (Signed)
She tells me she has had polyarthralgias and fatigue for about 4 days.  She states she has had an occasional, dry cough with a bit of a sore throat also.  She is in no distress.

## 2013-12-13 NOTE — ED Notes (Signed)
She states she feels better, and is visiting with her granddaughter.

## 2013-12-13 NOTE — ED Provider Notes (Signed)
CSN: [id number]     Arrival date & time 12/13/13  1154 History   First MD Initiated Contact with Patient 12/13/13 1220     Chief Complaint  Patient presents with  . Generalized Body Aches     (Consider location/radiation/quality/duration/timing/severity/associated sxs/prior Treatment) HPI  Patient to the ER with complaints of generalized body aches and chills for the past 3 days. She reports having a cough but feeling as though she can not cough something up. WHile laying in bed two nights ago she woke up with night sweats. She reports being on her third year of Tamoxifen for hx of breast cancer. She tried Copywriter, advertising Cold medication which helped but then her symptoms returned. She came in today because she started having body aches once again. Denies [place] from a swelling, chest pain, shortness of breath, wheezing, abdominal pain, nausea, vomiting, diarrhea, dysuria, vaginal discharge.  Past Medical History  Diagnosis Date  . Allergy   . Hypertension   . Arthritis   . Cancer    Past Surgical History  Procedure Laterality Date  . Thumb surgery     Family History  Problem Relation Age of Onset  . Diabetes Mother   . Diabetes Father    History  Substance Use Topics  . Smoking status: Unknown If Ever Smoked  . Smokeless tobacco: Not on file  . Alcohol Use: No   OB History   Grav Para Term Preterm Abortions TAB SAB Ect Mult Living                 Review of Systems  The patient denies anorexia, fever, weight loss,, vision loss, decreased hearing, hoarseness, chest pain, syncope, dyspnea on exertion, peripheral edema, balance deficits, hemoptysis, abdominal pain, melena, hematochezia, severe indigestion/heartburn, hematuria, incontinence, genital sores, muscle weakness, suspicious skin lesions, transient blindness, difficulty walking, depression, unusual weight change, abnormal bleeding, enlarged lymph nodes, angioedema, and breast masses.   Allergies  Aspirin and  Fexofenadine  Home Medications   Current Outpatient Rx  Name  Route  Sig  Dispense  Refill  . benzonatate (TESSALON) 100 MG capsule   Oral   Take 100 mg by mouth 3 (three) times daily as needed for cough.         . cetirizine (ZYRTEC) 10 MG tablet   Oral   Take 10 mg by mouth daily.           . cholecalciferol (VITAMIN D) 1000 UNITS tablet   Oral   Take 1,000 Units by mouth daily.         . cyclobenzaprine (FLEXERIL) 5 MG tablet   Oral   Take 1 tablet (5 mg total) by mouth 3 (three) times daily as needed for muscle spasms.   30 tablet   3   . diclofenac (VOLTAREN) 75 MG EC tablet   Oral   Take 1 tablet (75 mg total) by mouth 2 (two) times daily.   30 tablet   3   . tamoxifen (NOLVADEX) 20 MG tablet   Oral   Take 1 tablet (20 mg total) by mouth daily.   90 tablet   12   . traMADol (ULTRAM) 50 MG tablet   Oral   Take 1 tablet (50 mg total) by mouth every 8 (eight) hours as needed for pain.   45 tablet   1   . valsartan-hydrochlorothiazide (DIOVAN HCT) 160-12.5 MG per tablet   Oral   Take 1 tablet by mouth daily.   30 tablet  2   . Venlafaxine HCl 75 MG TB24   Oral   Take 1 tablet (75 mg total) by mouth daily.   90 each   6   . azithromycin (ZITHROMAX) 250 MG tablet   Oral   Take 1 tablet (250 mg total) by mouth daily. Take first 2 tablets together, then 1 every day until finished.   6 tablet   0   . HYDROcodone-acetaminophen (NORCO/VICODIN) 5-325 MG per tablet   Oral   Take 1-2 tablets by mouth every 4 (four) hours as needed.   15 tablet   0   . ondansetron (ZOFRAN) 4 MG tablet   Oral   Take 1 tablet (4 mg total) by mouth every 6 (six) hours.   12 tablet   0    BP 149/82  Pulse 76  Temp(Src) 98.6 F (37 C) (Oral)  Resp 18  SpO2 97% Physical Exam  Nursing note and vitals reviewed. Constitutional: She appears well-developed and well-nourished. No distress.  HENT:  Head: Normocephalic and atraumatic.  Right Ear: Tympanic membrane  and ear canal normal.  Left Ear: Tympanic membrane and ear canal normal.  Nose: Nose normal.  Mouth/Throat: Oropharynx is clear and moist. No oropharyngeal exudate.  Eyes: Pupils are equal, round, and reactive to light.  Neck: Normal range of motion. Neck supple.  Cardiovascular: Normal rate and regular rhythm.   Pulmonary/Chest: Effort normal.  Abdominal: Soft. Bowel sounds are normal. She exhibits no distension. There is no tenderness. There is no guarding.  Neurological: She is alert.  Skin: Skin is warm and dry.      ED Course  Procedures (including critical care time) Labs Review Labs Reviewed  CBC WITH DIFFERENTIAL - Abnormal; Notable for the following:    WBC 3.4 (*)    Platelets 120 (*)    Neutrophils Relative % 31 (*)    Neutro Abs 1.0 (*)    Lymphocytes Relative 56 (*)    Monocytes Relative 13 (*)    All other components within normal limits  BASIC METABOLIC PANEL - Abnormal; Notable for the following:    Glucose, Bld 113 (*)    All other components within normal limits  URINALYSIS, ROUTINE W REFLEX MICROSCOPIC   Imaging Review Dg Chest 2 View  12/13/2013   CLINICAL DATA:  Generalized chills and aches.  EXAM: CHEST  2 VIEW  COMPARISON:  08/18/2010  FINDINGS: The heart size and mediastinal contours are within normal limits. Both lungs are clear. The visualized skeletal structures are unremarkable.  IMPRESSION: No active cardiopulmonary disease.   Electronically Signed   By: Markus Daft M.D.   On: 12/13/2013 13:07     EKG Interpretation None      MDM   Final diagnoses:  URI (upper respiratory infection)    Patients labs, urine and chest xray are reassuring. Shj is afebrile but has a cough. Will start on abx since she is immunosuppressed and will have her follow-up with her PCP.  58 y.o.Sneha I Wandler's evaluation in the Emergency Department is complete. It has been determined that no acute conditions requiring further emergency intervention are present at  this time. The patient/guardian have been advised of the diagnosis and plan. We have discussed signs and symptoms that warrant return to the ED, such as changes or worsening in symptoms.  Vital signs are stable at discharge. Filed Vitals:   12/13/13 1202  BP: 149/82  Pulse: 76  Temp: 98.6 F (37 C)  Resp: 18  Patient/guardian has voiced understanding and agreed to follow-up with the PCP or specialist.     Linus Mako, PA-C 12/13/13 1500

## 2014-04-15 ENCOUNTER — Other Ambulatory Visit: Payer: Self-pay | Admitting: *Deleted

## 2014-04-15 DIAGNOSIS — D0512 Intraductal carcinoma in situ of left breast: Secondary | ICD-10-CM

## 2014-04-15 MED ORDER — TAMOXIFEN CITRATE 20 MG PO TABS: 20.0000 mg | ORAL_TABLET | Freq: Every day | ORAL | Status: DC

## 2014-04-21 ENCOUNTER — Other Ambulatory Visit: Payer: Self-pay | Admitting: *Deleted

## 2014-04-21 DIAGNOSIS — D0510 Intraductal carcinoma in situ of unspecified breast: Secondary | ICD-10-CM

## 2014-04-22 ENCOUNTER — Other Ambulatory Visit (HOSPITAL_BASED_OUTPATIENT_CLINIC_OR_DEPARTMENT_OTHER): Payer: Medicaid Other

## 2014-04-22 DIAGNOSIS — D0590 Unspecified type of carcinoma in situ of unspecified breast: Secondary | ICD-10-CM

## 2014-04-22 DIAGNOSIS — D0510 Intraductal carcinoma in situ of unspecified breast: Secondary | ICD-10-CM

## 2014-04-22 LAB — CBC WITH DIFFERENTIAL/PLATELET
BASO%: 0.6 % (ref 0.0–2.0)
Basophils Absolute: 0 10*3/uL (ref 0.0–0.1)
EOS ABS: 0 10*3/uL (ref 0.0–0.5)
EOS%: 0.6 % (ref 0.0–7.0)
HCT: 37.3 % (ref 34.8–46.6)
HGB: 12.4 g/dL (ref 11.6–15.9)
LYMPH%: 35.7 % (ref 14.0–49.7)
MCH: 32.7 pg (ref 25.1–34.0)
MCHC: 33.3 g/dL (ref 31.5–36.0)
MCV: 98.2 fL (ref 79.5–101.0)
MONO#: 0.4 10*3/uL (ref 0.1–0.9)
MONO%: 9.8 % (ref 0.0–14.0)
NEUT%: 53.3 % (ref 38.4–76.8)
NEUTROS ABS: 2.4 10*3/uL (ref 1.5–6.5)
PLATELETS: 118 10*3/uL — AB (ref 145–400)
RBC: 3.79 10*6/uL (ref 3.70–5.45)
RDW: 14.2 % (ref 11.2–14.5)
WBC: 4.4 10*3/uL (ref 3.9–10.3)
lymph#: 1.6 10*3/uL (ref 0.9–3.3)

## 2014-04-22 LAB — COMPREHENSIVE METABOLIC PANEL (CC13)
ALBUMIN: 2.8 g/dL — AB (ref 3.5–5.0)
ALT: 87 U/L — AB (ref 0–55)
ANION GAP: 8 meq/L (ref 3–11)
AST: 109 U/L — ABNORMAL HIGH (ref 5–34)
Alkaline Phosphatase: 175 U/L — ABNORMAL HIGH (ref 40–150)
BUN: 12.8 mg/dL (ref 7.0–26.0)
CO2: 25 meq/L (ref 22–29)
Calcium: 9.1 mg/dL (ref 8.4–10.4)
Chloride: 105 mEq/L (ref 98–109)
Creatinine: 0.9 mg/dL (ref 0.6–1.1)
GLUCOSE: 283 mg/dL — AB (ref 70–140)
Potassium: 4.2 mEq/L (ref 3.5–5.1)
SODIUM: 138 meq/L (ref 136–145)
TOTAL PROTEIN: 7 g/dL (ref 6.4–8.3)
Total Bilirubin: 1.37 mg/dL — ABNORMAL HIGH (ref 0.20–1.20)

## 2014-04-29 ENCOUNTER — Encounter: Payer: Self-pay | Admitting: Adult Health

## 2014-04-29 ENCOUNTER — Telehealth: Payer: Self-pay | Admitting: Adult Health

## 2014-04-29 ENCOUNTER — Ambulatory Visit (HOSPITAL_BASED_OUTPATIENT_CLINIC_OR_DEPARTMENT_OTHER): Payer: Medicaid Other | Admitting: Adult Health

## 2014-04-29 VITALS — BP 125/64 | HR 81 | Temp 98.2°F | Resp 20 | Ht 67.0 in | Wt 253.5 lb

## 2014-04-29 DIAGNOSIS — D0590 Unspecified type of carcinoma in situ of unspecified breast: Secondary | ICD-10-CM

## 2014-04-29 DIAGNOSIS — F411 Generalized anxiety disorder: Secondary | ICD-10-CM

## 2014-04-29 DIAGNOSIS — N959 Unspecified menopausal and perimenopausal disorder: Secondary | ICD-10-CM

## 2014-04-29 DIAGNOSIS — D0512 Intraductal carcinoma in situ of left breast: Secondary | ICD-10-CM

## 2014-04-29 DIAGNOSIS — B1710 Acute hepatitis C without hepatic coma: Secondary | ICD-10-CM

## 2014-04-29 NOTE — Patient Instructions (Signed)
You are doing well.  You have no sign of recurrence.  Continue taking Tamoxifen daily.  I recommend a healthy diet, monthly breast exams, and exercise.  We will see you back in November after your mammogram.  Please f/u with your PCP about your lab results.    Breast Self-Awareness Practicing breast self-awareness may pick up problems early, prevent significant medical complications, and possibly save your life. By practicing breast self-awareness, you can become familiar with how your breasts look and feel and if your breasts are changing. This allows you to notice changes early. It can also offer you some reassurance that your breast health is good. One way to learn what is normal for your breasts and whether your breasts are changing is to do a breast self-exam. If you find a lump or something that was not present in the past, it is best to contact your caregiver right away. Other findings that should be evaluated by your caregiver include nipple discharge, especially if it is bloody; skin changes or reddening; areas where the skin seems to be pulled in (retracted); or new lumps and bumps. Breast pain is seldom associated with cancer (malignancy), but should also be evaluated by a caregiver. HOW TO PERFORM A BREAST SELF-EXAM The best time to examine your breasts is 5-7 days after your menstrual period is over. During menstruation, the breasts are lumpier, and it may be more difficult to pick up changes. If you do not menstruate, have reached menopause, or had your uterus removed (hysterectomy), you should examine your breasts at regular intervals, such as monthly. If you are breastfeeding, examine your breasts after a feeding or after using a breast pump. Breast implants do not decrease the risk for lumps or tumors, so continue to perform breast self-exams as recommended. Talk to your caregiver about how to determine the difference between the implant and breast tissue. Also, talk about the amount of  pressure you should use during the exam. Over time, you will become more familiar with the variations of your breasts and more comfortable with the exam. A breast self-exam requires you to remove all your clothes above the waist. 1. Look at your breasts and nipples. Stand in front of a mirror in a room with good lighting. With your hands on your hips, push your hands firmly downward. Look for a difference in shape, contour, and size from one breast to the other (asymmetry). Asymmetry includes puckers, dips, or bumps. Also, look for skin changes, such as reddened or scaly areas on the breasts. Look for nipple changes, such as discharge, dimpling, repositioning, or redness. 2. Carefully feel your breasts. This is best done either in the shower or tub while using soapy water or when flat on your back. Place the arm (on the side of the breast you are examining) above your head. Use the pads (not the fingertips) of your three middle fingers on your opposite hand to feel your breasts. Start in the underarm area and use  inch (2 cm) overlapping circles to feel your breast. Use 3 different levels of pressure (light, medium, and firm pressure) at each circle before moving to the next circle. The light pressure is needed to feel the tissue closest to the skin. The medium pressure will help to feel breast tissue a little deeper, while the firm pressure is needed to feel the tissue close to the ribs. Continue the overlapping circles, moving downward over the breast until you feel your ribs below your breast. Then, move one  finger-width towards the center of the body. Continue to use the  inch (2 cm) overlapping circles to feel your breast as you move slowly up toward the collar bone (clavicle) near the base of the neck. Continue the up and down exam using all 3 pressures until you reach the middle of the chest. Do this with each breast, carefully feeling for lumps or changes. 3.  Keep a written record with breast changes  or normal findings for each breast. By writing this information down, you do not need to depend only on memory for size, tenderness, or location. Write down where you are in your menstrual cycle, if you are still menstruating. Breast tissue can have some lumps or thick tissue. However, see your caregiver if you find anything that concerns you.  SEEK MEDICAL CARE IF:  You see a change in shape, contour, or size of your breasts or nipples.   You see skin changes, such as reddened or scaly areas on the breasts or nipples.   You have an unusual discharge from your nipples.   You feel a new lump or unusually thick areas.  Document Released: 09/03/2005 Document Revised: 08/20/2012 Document Reviewed: 12/19/2011 [facility] Patient Information 2015 [place], [place]. This information is not intended to replace advice given to you by your health care provider. Make sure you discuss any questions you have with your health care provider.

## 2014-04-29 NOTE — Progress Notes (Addendum)
OFFICE PROGRESS NOTE  CC Dr. Fanny Skates Dr. 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  #2 she then received radiation therapy to the left breast from 09/21/2010 to 11/09/2010.  #3 she was then started on tamoxifen 20 mg daily in March 2012.  CURRENT THERAPY: Tamoxifen 20 mg daily since March 2012.  INTERVAL HISTORY: [place] 58 y.o. female returns for followup visit today for her h/o low grade DCIS. She is taking Tamoxifen daily.  She does have some mild swelling in her right leg, and pain near the knee and behind the knee.  She says the swelling is intermittent.  She does experience pain there, and takes Tramadol and Glucosamine for this pain.  She is otherwise doing well and denies headaches, vision changes, new pain, fevers, chills, nausea, vomiting, bowel/bladder changes or any further concerns.  She does have a h/o Hepatitis C and sees her new PCP, Dr. Marlou Sa for this.  She was recommended a liver biopsy, and saw a GI physician and does not remember all of the details, because she was diagnosed in 2000.  She continues to take Effexor for anxiety, depression and hot flashes, and tells me that it is continuing to help her considerably well.  We updated her health maintenance below.    MEDICAL HISTORY: Past Medical History  Diagnosis Date  . Allergy   . Hypertension   . Arthritis   . Cancer     ALLERGIES:  is allergic to aspirin and fexofenadine.  MEDICATIONS:  Current Outpatient Prescriptions  Medication Sig Dispense Refill  . benzonatate (TESSALON) 100 MG capsule Take 100 mg by mouth 3 (three) times daily as needed for cough.      . cetirizine (ZYRTEC) 10 MG tablet Take 10 mg by mouth daily.        .  cholecalciferol (VITAMIN D) 1000 UNITS tablet Take 1,000 Units by mouth daily.      . cyclobenzaprine (FLEXERIL) 5 MG tablet Take 1 tablet (5 mg total) by mouth 3 (three) times daily as needed for muscle spasms.  30 tablet  3  . diclofenac (VOLTAREN) 75 MG EC tablet Take 1 tablet (75 mg total) by mouth 2 (two) times daily.  30 tablet  3  . tamoxifen (NOLVADEX) 20 MG tablet Take 1 tablet (20 mg total) by mouth daily.  90 tablet  3  . traMADol (ULTRAM) 50 MG tablet Take 1 tablet (50 mg total) by mouth every 8 (eight) hours as needed for pain.  45 tablet  1  . valsartan-hydrochlorothiazide (DIOVAN HCT) 160-12.5 MG per tablet Take 1 tablet by mouth daily.  30 tablet  2  . Venlafaxine HCl 75 MG TB24 Take 1 tablet (75 mg total) by mouth daily.  90 each  6  . [DISCONTINUED] clonazePAM (KLONOPIN) 0.25 MG disintegrating tablet Take 1 tablet (0.25 mg total) by mouth 2 (two) times daily as needed.  60 tablet  0   No current facility-administered medications for this visit.    SURGICAL HISTORY:  Past Surgical History  Procedure Laterality Date  . Thumb surgery      REVIEW OF SYSTEMS:  A 10 point review of systems was conducted and is otherwise negative except for what is noted above.     Health Maintenance Mammogram:  07/13/13 Colonoscopy: 10/2011 Pap Smear: 04/2012 Eye Exam: 2014 Lipid Panel: 2015   PHYSICAL EXAMINATION:  BP 125/64  Pulse 81  Temp(Src) 98.2 F (36.8 C) (Oral)  Resp 20  Ht 5\' 7"  (1.702 m)  Wt 253 lb 8 oz (114.987 kg)  BMI 39.69 kg/m2 GENERAL: Patient is a well appearing female in no acute distress HEENT:  Sclerae anicteric.  Oropharynx clear and moist. No ulcerations or evidence of oropharyngeal candidiasis. Neck is supple.  NODES:  No cervical, supraclavicular, or axillary lymphadenopathy palpated.  BREAST EXAM: left breast s/p lumpectomy, no nodularity, no masses, right breast no nodules or masses, benign bilateral breast exam LUNGS:  Clear to auscultation bilaterally.   No wheezes or rhonchi. HEART:  Regular rate and rhythm. No murmur appreciated. ABDOMEN:  Soft, nontender.  Positive, normoactive bowel sounds. No organomegaly palpated. MSK:  No focal spinal tenderness to palpation. Full range of motion bilaterally in the upper extremities. EXTREMITIES: no peripheral edema   SKIN:  Clear with no obvious rashes or skin changes. No nail dyscrasia. NEURO:  Nonfocal. Well oriented.  Appropriate affect. ECOG PERFORMANCE STATUS: 0 - Asymptomatic    LABORATORY DATA: Lab Results  Component Value Date   WBC 4.4 04/22/2014   HGB 12.4 04/22/2014   HCT 37.3 04/22/2014   MCV 98.2 04/22/2014   PLT 118* 04/22/2014      Chemistry      Component Value Date/Time   NA 138 04/22/2014 0942   NA 138 12/13/2013 1310   K 4.2 04/22/2014 0942   K 3.9 12/13/2013 1310   CL 104 12/13/2013 1310   CL 108* 01/26/2013 0914   CO2 25 04/22/2014 0942   CO2 22 12/13/2013 1310   BUN 12.8 04/22/2014 0942   BUN 11 12/13/2013 1310   CREATININE 0.9 04/22/2014 0942   CREATININE 0.71 12/13/2013 1310      Component Value Date/Time   CALCIUM 9.1 04/22/2014 0942   CALCIUM 8.5 12/13/2013 1310   ALKPHOS 175* 04/22/2014 0942   ALKPHOS 77 11/18/2012 1030   AST 109* 04/22/2014 0942   AST 120* 11/18/2012 1030   ALT 87* 04/22/2014 0942   ALT 101* 11/18/2012 1030   BILITOT 1.37* 04/22/2014 0942   BILITOT 0.6 11/18/2012 1030       RADIOGRAPHIC STUDIES:  No results found.  ASSESSMENT: 58 year old female with   #1 DCIS of the left breast status post lumpectomy in December 2011 followed by radiation therapy which he completed in February 2012. She was then begun on tamoxifen in March 2012 overall she is tolerating it well.   #2 Patient does have anxiety depression and hot flashes and we have her on Effexor 75 mg daily and that is helping her considerably well.   PLAN:   [place] is doing moderately well today.  She has no sign of recurrence.  Her mammogram was last in October, 2014 and she will have this repeated in 2  months.  She is tolerating Tamoxifen well and I recommended that she continue this.    [place] does have a history of Hepatitis C and her liver enzymes are worse when I reviewed her CMP with her today.  I have requested that my nurse send these results to her PCP for their review.    I reviewed survivorship with her and recommended a healthy diet, breast exams, and exercise.  [place] will return in November following her mammogram for labs and an appt with Dr. Lindi Adie.    All questions were answered. The patient  knows to call the clinic with any problems, questions or concerns. We can certainly see the patient much sooner if necessary.  I spent 25 minutes counseling the patient face to face. The total time spent in the appointment was 30 minutes.  Minette Headland, NP Medical Oncology [facility] [phone] 04/29/2014, 4:21 PM     Attending Note  I personally saw and examined [place]. The plan of care was discussed with her. I agree with the assessment and plan as documented above. Plan: Continue with current plan of tamoxifen. Followup of elevated liver function tests and referred the patient to her primary care physician for further workup. Patient has a history of hepatitis C. Survivorship information was provided.  Signed Rulon Eisenmenger, MD

## 2014-04-29 NOTE — Telephone Encounter (Signed)
per pof to sch appt-gave pt copy of sch °

## 2014-06-11 ENCOUNTER — Other Ambulatory Visit: Payer: Self-pay | Admitting: Sports Medicine

## 2014-06-11 DIAGNOSIS — M545 Low back pain, unspecified: Secondary | ICD-10-CM

## 2014-06-21 ENCOUNTER — Other Ambulatory Visit: Payer: Self-pay | Admitting: Internal Medicine

## 2014-06-21 DIAGNOSIS — Z853 Personal history of malignant neoplasm of breast: Secondary | ICD-10-CM

## 2014-06-22 ENCOUNTER — Ambulatory Visit
Admission: RE | Admit: 2014-06-22 | Discharge: 2014-06-22 | Disposition: A | Discharge status: Home / Self Care | Payer: Medicaid Other | Source: Ambulatory Visit | Attending: Sports Medicine | Admitting: Sports Medicine

## 2014-06-22 DIAGNOSIS — M545 Low back pain, unspecified: Secondary | ICD-10-CM

## 2014-07-14 ENCOUNTER — Ambulatory Visit
Admission: RE | Admit: 2014-07-14 | Discharge: 2014-07-14 | Disposition: A | Discharge status: Home / Self Care | Payer: Medicaid Other | Source: Ambulatory Visit | Attending: Internal Medicine | Admitting: Internal Medicine

## 2014-07-14 DIAGNOSIS — Z853 Personal history of malignant neoplasm of breast: Secondary | ICD-10-CM

## 2014-07-22 ENCOUNTER — Other Ambulatory Visit (HOSPITAL_BASED_OUTPATIENT_CLINIC_OR_DEPARTMENT_OTHER): Payer: Medicaid Other

## 2014-07-22 DIAGNOSIS — D0590 Unspecified type of carcinoma in situ of unspecified breast: Secondary | ICD-10-CM

## 2014-07-22 DIAGNOSIS — D0512 Intraductal carcinoma in situ of left breast: Secondary | ICD-10-CM

## 2014-07-22 LAB — CBC WITH DIFFERENTIAL/PLATELET
BASO%: 0.6 % (ref 0.0–2.0)
Basophils Absolute: 0 10*3/uL (ref 0.0–0.1)
EOS%: 1.1 % (ref 0.0–7.0)
Eosinophils Absolute: 0 10*3/uL (ref 0.0–0.5)
HEMATOCRIT: 37.8 % (ref 34.8–46.6)
HGB: 12.5 g/dL (ref 11.6–15.9)
LYMPH#: 1.5 10*3/uL (ref 0.9–3.3)
LYMPH%: 38.6 % (ref 14.0–49.7)
MCH: 32.7 pg (ref 25.1–34.0)
MCHC: 33.1 g/dL (ref 31.5–36.0)
MCV: 98.8 fL (ref 79.5–101.0)
MONO#: 0.5 10*3/uL (ref 0.1–0.9)
MONO%: 11.9 % (ref 0.0–14.0)
NEUT#: 1.9 10*3/uL (ref 1.5–6.5)
NEUT%: 47.8 % (ref 38.4–76.8)
Platelets: 112 10*3/uL — ABNORMAL LOW (ref 145–400)
RBC: 3.82 10*6/uL (ref 3.70–5.45)
RDW: 14.4 % (ref 11.2–14.5)
WBC: 3.9 10*3/uL (ref 3.9–10.3)

## 2014-07-22 LAB — COMPREHENSIVE METABOLIC PANEL (CC13)
ALT: 112 U/L — AB (ref 0–55)
ANION GAP: 6 meq/L (ref 3–11)
AST: 130 U/L — AB (ref 5–34)
Albumin: 2.7 g/dL — ABNORMAL LOW (ref 3.5–5.0)
Alkaline Phosphatase: 158 U/L — ABNORMAL HIGH (ref 40–150)
BUN: 10.2 mg/dL (ref 7.0–26.0)
CALCIUM: 8.6 mg/dL (ref 8.4–10.4)
CO2: 27 meq/L (ref 22–29)
CREATININE: 0.8 mg/dL (ref 0.6–1.1)
Chloride: 106 mEq/L (ref 98–109)
Glucose: 228 mg/dl — ABNORMAL HIGH (ref 70–140)
Potassium: 4.3 mEq/L (ref 3.5–5.1)
SODIUM: 138 meq/L (ref 136–145)
TOTAL PROTEIN: 6.6 g/dL (ref 6.4–8.3)
Total Bilirubin: 0.99 mg/dL (ref 0.20–1.20)

## 2014-07-29 ENCOUNTER — Ambulatory Visit (HOSPITAL_BASED_OUTPATIENT_CLINIC_OR_DEPARTMENT_OTHER): Payer: Medicaid Other | Admitting: Hematology and Oncology

## 2014-07-29 ENCOUNTER — Telehealth: Payer: Self-pay | Admitting: Hematology and Oncology

## 2014-07-29 VITALS — BP 140/77 | HR 87 | Temp 98.2°F | Resp 18 | Ht 67.0 in | Wt 251.1 lb

## 2014-07-29 DIAGNOSIS — B1920 Unspecified viral hepatitis C without hepatic coma: Secondary | ICD-10-CM

## 2014-07-29 DIAGNOSIS — D0512 Intraductal carcinoma in situ of left breast: Secondary | ICD-10-CM

## 2014-07-29 NOTE — Progress Notes (Signed)
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  #2 she then received radiation therapy to the left breast from 09/21/2010 to 11/09/2010.  #3 she was then started on tamoxifen 20 mg daily in March 2012.  CURRENT THERAPY: Tamoxifen 20 mg daily since March 2012.  CHIEF COMPLIANT: followup on tamoxifen  INTERVAL HISTORY: Elizabeth Mclean is a 58 year old African American lady with above-mentioned history of DCIS. She is here for six-month followup. She reports no major problems or concerns. She had a mammogram in October which was normal. She denies any trouble with bleeding from tamoxifen. Denies any lumps or nodules in the breast.   REVIEW OF SYSTEMS:   Constitutional: Denies fevers, chills or abnormal weight loss Eyes: Denies blurriness of vision Ears, nose, mouth, throat, and face: Denies mucositis or sore throat Respiratory: Denies cough, dyspnea or wheezes Cardiovascular: Denies palpitation, chest discomfort or lower extremity swelling Gastrointestinal:  Denies nausea, heartburn or change in bowel habits Skin: Denies abnormal skin rashes Lymphatics: Denies new lymphadenopathy or easy bruising Neurological:Denies numbness, tingling or new weaknesses Behavioral/Psych: Mood is stable, no new changes  Breast:  denies any pain or lumps or nodules in either breasts All other systems were reviewed with the patient and are negative.  I have reviewed the past medical history, past surgical history, social history and family history with the patient and they are unchanged from previous note.  ALLERGIES:  is allergic to aspirin and fexofenadine.  MEDICATIONS:  Current Outpatient Prescriptions  Medication Sig Dispense Refill  . ACCU-CHEK AVIVA PLUS test  strip   0  . ACCU-CHEK SOFTCLIX LANCETS lancets   0  . BD PEN NEEDLE NANO U/F 32G X 4 MM MISC   0  . benzonatate (TESSALON) 100 MG capsule Take 100 mg by mouth 3 (three) times daily as needed for cough.    . Blood Glucose Monitoring Suppl (ACCU-CHEK AVIVA PLUS) W/DEVICE KIT   0  . cetirizine (ZYRTEC) 10 MG tablet Take 10 mg by mouth daily.      . cholecalciferol (VITAMIN D) 1000 UNITS tablet Take 1,000 Units by mouth daily.    . cyclobenzaprine (FLEXERIL) 5 MG tablet Take 1 tablet (5 mg total) by mouth 3 (three) times daily as needed for muscle spasms. 30 tablet 3  . diclofenac (VOLTAREN) 75 MG EC tablet Take 1 tablet (75 mg total) by mouth 2 (two) times daily. 30 tablet 3  . LEVEMIR FLEXTOUCH 100 UNIT/ML Pen 28 Units at bedtime.   0  . NOVOLOG FLEXPEN 100 UNIT/ML FlexPen Sliding scale  0  . tamoxifen (NOLVADEX) 20 MG tablet Take 1 tablet (20 mg total) by mouth daily. 90 tablet 3  . traMADol (ULTRAM) 50 MG tablet Take 1 tablet (50 mg total) by mouth every 8 (eight) hours as needed for pain. 45 tablet 1  . valsartan-hydrochlorothiazide (DIOVAN HCT) 160-12.5 MG per tablet Take 1 tablet by mouth daily. 30 tablet 2  . Venlafaxine HCl 75 MG TB24 Take 1 tablet (75 mg total) by mouth daily. 90 each 6  . [DISCONTINUED] clonazePAM (KLONOPIN) 0.25 MG disintegrating tablet Take 1 tablet (0.25 mg total) by mouth 2 (two) times daily as needed. 60 tablet 0   No current facility-administered medications for this visit.    PHYSICAL EXAMINATION: ECOG PERFORMANCE  STATUS: 0 - Asymptomatic  Filed Vitals:   07/29/14 1027  BP: 140/77  Pulse: 87  Temp: 98.2 F (36.8 C)  Resp: 18   Filed Weights   07/29/14 1027  Weight: 251 lb 1.6 oz (113.898 kg)    GENERAL:alert, no distress and comfortable SKIN: skin color, texture, turgor are normal, no rashes or significant lesions EYES: normal, Conjunctiva are pink and non-injected, sclera clear OROPHARYNX:no exudate, no erythema and lips, buccal mucosa, and  tongue normal  NECK: supple, thyroid normal size, non-tender, without nodularity LYMPH:  no palpable lymphadenopathy in the cervical, axillary or inguinal LUNGS: clear to auscultation and percussion with normal breathing effort HEART: regular rate & rhythm and no murmurs and no lower extremity edema ABDOMEN:abdomen soft, non-tender and normal bowel sounds Musculoskeletal:no cyanosis of digits and no clubbing  NEURO: alert & oriented x 3 with fluent speech, no focal motor/sensory deficits BREAST: No palpable masses or nodules in either right or left breasts. No palpable axillary supraclavicular or infraclavicular adenopathy no breast tenderness or nipple discharge.   LABORATORY DATA:  I have reviewed the data as listed   Chemistry      Component Value Date/Time   NA 138 07/22/2014 0917   NA 138 12/13/2013 1310   K 4.3 07/22/2014 0917   K 3.9 12/13/2013 1310   CL 104 12/13/2013 1310   CL 108* 01/26/2013 0914   CO2 27 07/22/2014 0917   CO2 22 12/13/2013 1310   BUN 10.2 07/22/2014 0917   BUN 11 12/13/2013 1310   CREATININE 0.8 07/22/2014 0917   CREATININE 0.71 12/13/2013 1310      Component Value Date/Time   CALCIUM 8.6 07/22/2014 0917   CALCIUM 8.5 12/13/2013 1310   ALKPHOS 158* 07/22/2014 0917   ALKPHOS 77 11/18/2012 1030   AST 130* 07/22/2014 0917   AST 120* 11/18/2012 1030   ALT 112* 07/22/2014 0917   ALT 101* 11/18/2012 1030   BILITOT 0.99 07/22/2014 0917   BILITOT 0.6 11/18/2012 1030       Lab Results  Component Value Date   WBC 3.9 07/22/2014   HGB 12.5 07/22/2014   HCT 37.8 07/22/2014   MCV 98.8 07/22/2014   PLT 112* 07/22/2014   NEUTROABS 1.9 07/22/2014     RADIOGRAPHIC STUDIES: Mammograms done October 2015 normal  ASSESSMENT & PLAN:  Ductal carcinoma in situ (DCIS) of left breast DCIS of the left breast status post lumpectomy in December 2011 followed by radiation therapy which he completed in February 2012. She was then begun on tamoxifen in March  2012 overall she is tolerating it well  Tamoxifen counseling: Patient will finish 5 years of tamoxifen in March 2017. She is not experiencing any major side effects and tamoxifen other than hot flashes occasionally and muscle aches or pains.  Surveillance: Today's breast exam was normal and I reviewed her mammograms done in October 2015. Which were also normal.  Survivorship: Patient was diagnosed with diabetes and she is on insulin therapy. I instructed her to stay on top of it and make sure the sugars do not come by. Her sugars today are 228.  Hepatitis C: Which is causing the elevation of AST and ALT. Also causing low platelet count. I instructed her to discuss treatment options for hepatitis C. The patient had made up her mind to not get treated.   No orders of the defined types were placed in this encounter.   The patient has a good understanding of the overall plan. she agrees with  it. She will call with any problems that may develop before her next visit here.  I spent 15 minutes counseling the patient face to face. The total time spent in the appointment was 20 minutes and more than 50% was on counseling and review of test results    Rulon Eisenmenger, MD 07/29/2014 11:34 AM

## 2014-07-29 NOTE — Telephone Encounter (Signed)
per pof to sch pt appt-gave pt copy of appt °

## 2014-07-29 NOTE — Assessment & Plan Note (Signed)
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 She was then begun on tamoxifen in March 2012 overall she is tolerating it well  Tamoxifen counseling: Patient will finish 5 years of tamoxifen in March 2017. She is not experiencing any major side effects and tamoxifen other than hot flashes occasionally and muscle aches or pains.  Surveillance: Today's breast exam was normal and I reviewed her mammograms done in October 2015. Which were also normal.  Survivorship: Patient was diagnosed with diabetes and she is on insulin therapy. I instructed her to stay on top of it and make sure the sugars do not come by. Her sugars today are 228.  Hepatitis C: Which is causing the elevation of AST and ALT. Also causing low platelet count. I instructed her to discuss treatment options for hepatitis C. The patient had made up her mind to not get treated.

## 2014-09-22 ENCOUNTER — Ambulatory Visit (INDEPENDENT_AMBULATORY_CARE_PROVIDER_SITE_OTHER): Payer: Medicaid Other

## 2014-09-22 ENCOUNTER — Encounter: Payer: Self-pay | Admitting: Podiatrist

## 2014-09-22 ENCOUNTER — Ambulatory Visit (INDEPENDENT_AMBULATORY_CARE_PROVIDER_SITE_OTHER): Payer: Medicaid Other | Admitting: Podiatrist

## 2014-09-22 VITALS — BP 130/78 | HR 70 | Resp 16

## 2014-09-22 DIAGNOSIS — M79676 Pain in unspecified toe(s): Secondary | ICD-10-CM

## 2014-09-22 DIAGNOSIS — M722 Plantar fascial fibromatosis: Secondary | ICD-10-CM

## 2014-09-22 DIAGNOSIS — B351 Tinea unguium: Secondary | ICD-10-CM

## 2014-09-22 NOTE — Patient Instructions (Signed)
Plantar Fasciitis (Heel Spur Syndrome) with Rehab The plantar fascia is a fibrous, ligament-like, soft-tissue structure that spans the bottom of the foot. Plantar fasciitis is a condition that causes pain in the foot due to inflammation of the tissue. SYMPTOMS   Pain and tenderness on the underneath side of the foot.  Pain that worsens with standing or walking. CAUSES  Plantar fasciitis is caused by irritation and injury to the plantar fascia on the underneath side of the foot. Common mechanisms of injury include:  Direct trauma to bottom of the foot.  Damage to a small nerve that runs under the foot where the main fascia attaches to the heel bone.  Stress placed on the plantar fascia due to bone spurs. RISK INCREASES WITH:   Activities that place stress on the plantar fascia (running, jumping, pivoting, or cutting).  Poor strength and flexibility.  Improperly fitted shoes.  Tight calf muscles.  Flat feet.  Failure to warm-up properly before activity.  Obesity. PREVENTION  Warm up and stretch properly before activity.  Allow for adequate recovery between workouts.  Maintain physical fitness:  Strength, flexibility, and endurance.  Cardiovascular fitness.  Maintain a health body weight.  Avoid stress on the plantar fascia.  Wear properly fitted shoes, including arch supports for individuals who have flat feet.  PROGNOSIS  If treated properly, then the symptoms of plantar fasciitis usually resolve without surgery. However, occasionally surgery is necessary.  RELATED COMPLICATIONS   Recurrent symptoms that may result in a chronic condition.  Problems of the lower back that are caused by compensating for the injury, such as limping.  Pain or weakness of the foot during push-off following surgery.  Chronic inflammation, scarring, and partial or complete fascia tear, occurring more often from repeated injections.  TREATMENT  Treatment initially involves the  use of ice and medication to help reduce pain and inflammation. The use of strengthening and stretching exercises may help reduce pain with activity, especially stretches of the Achilles tendon. These exercises may be performed at home or with a therapist. Your caregiver may recommend that you use heel cups of arch supports to help reduce stress on the plantar fascia. Occasionally, corticosteroid injections are given to reduce inflammation. If symptoms persist for greater than 6 months despite non-surgical (conservative), then surgery may be recommended.   MEDICATION   If pain medication is necessary, then nonsteroidal anti-inflammatory medications, such as aspirin and ibuprofen, or other minor pain relievers, such as acetaminophen, are often recommended.  Do not take pain medication within 7 days before surgery.  Prescription pain relievers may be given if deemed necessary by your caregiver. Use only as directed and only as much as you need.  Corticosteroid injections may be given by your caregiver. These injections should be reserved for the most serious cases, because they may only be given a certain number of times.  HEAT AND COLD  Cold treatment (icing) relieves pain and reduces inflammation. Cold treatment should be applied for 10 to 15 minutes every 2 to 3 hours for inflammation and pain and immediately after any activity that aggravates your symptoms. Use ice packs or massage the area with a piece of ice (ice massage).  Heat treatment may be used prior to performing the stretching and strengthening activities prescribed by your caregiver, physical therapist, or athletic trainer. Use a heat pack or soak the injury in warm water.  SEEK IMMEDIATE MEDICAL CARE IF:  Treatment seems to offer no benefit, or the condition worsens.  Any medications   produce adverse side effects.  EXERCISES- RANGE OF MOTION (ROM) AND STRETCHING EXERCISES - Plantar Fasciitis (Heel Spur Syndrome) These exercises  may help you when beginning to rehabilitate your injury. Your symptoms may resolve with or without further involvement from your physician, physical therapist or athletic trainer. While completing these exercises, remember:   Restoring tissue flexibility helps normal motion to return to the joints. This allows healthier, less painful movement and activity.  An effective stretch should be held for at least 30 seconds.  A stretch should never be painful. You should only feel a gentle lengthening or release in the stretched tissue.  RANGE OF MOTION - Toe Extension, Flexion  Sit with your right / left leg crossed over your opposite knee.  Grasp your toes and gently pull them back toward the top of your foot. You should feel a stretch on the bottom of your toes and/or foot.  Hold this stretch for 10 seconds.  Now, gently pull your toes toward the bottom of your foot. You should feel a stretch on the top of your toes and or foot.  Hold this stretch for 10 seconds. Repeat  times. Complete this stretch 3 times per day.   RANGE OF MOTION - Ankle Dorsiflexion, Active Assisted  Remove shoes and sit on a chair that is preferably not on a carpeted surface.  Place right / left foot under knee. Extend your opposite leg for support.  Keeping your heel down, slide your right / left foot back toward the chair until you feel a stretch at your ankle or calf. If you do not feel a stretch, slide your bottom forward to the edge of the chair, while still keeping your heel down.  Hold this stretch for 10 seconds. Repeat 3 times. Complete this stretch 2 times per day.   STRETCH  Gastroc, Standing  Place hands on wall.  Extend right / left leg, keeping the front knee somewhat bent.  Slightly point your toes inward on your back foot.  Keeping your right / left heel on the floor and your knee straight, shift your weight toward the wall, not allowing your back to arch.  You should feel a gentle stretch  in the right / left calf. Hold this position for 10 seconds. Repeat 3 times. Complete this stretch 2 times per day.  STRETCH  Soleus, Standing  Place hands on wall.  Extend right / left leg, keeping the other knee somewhat bent.  Slightly point your toes inward on your back foot.  Keep your right / left heel on the floor, bend your back knee, and slightly shift your weight over the back leg so that you feel a gentle stretch deep in your back calf.  Hold this position for 10 seconds. Repeat 3 times. Complete this stretch 2 times per day.  STRETCH  Gastrocsoleus, Standing  Note: This exercise can place a lot of stress on your foot and ankle. Please complete this exercise only if specifically instructed by your caregiver.   Place the ball of your right / left foot on a step, keeping your other foot firmly on the same step.  Hold on to the wall or a rail for balance.  Slowly lift your other foot, allowing your body weight to press your heel down over the edge of the step.  You should feel a stretch in your right / left calf.  Hold this position for 10 seconds.  Repeat this exercise with a slight bend in your right /   left knee. Repeat 3 times. Complete this stretch 2 times per day.   STRENGTHENING EXERCISES - Plantar Fasciitis (Heel Spur Syndrome)  These exercises may help you when beginning to rehabilitate your injury. They may resolve your symptoms with or without further involvement from your physician, physical therapist or athletic trainer. While completing these exercises, remember:   Muscles can gain both the endurance and the strength needed for everyday activities through controlled exercises.  Complete these exercises as instructed by your physician, physical therapist or athletic trainer. Progress the resistance and repetitions only as guided.  STRENGTH - Towel Curls  Sit in a chair positioned on a non-carpeted surface.  Place your foot on a towel, keeping your heel  on the floor.  Pull the towel toward your heel by only curling your toes. Keep your heel on the floor. Repeat 3 times. Complete this exercise 2 times per day.  STRENGTH - Ankle Inversion  Secure one end of a rubber exercise band/tubing to a fixed object (table, pole). Loop the other end around your foot just before your toes.  Place your fists between your knees. This will focus your strengthening at your ankle.  Slowly, pull your big toe up and in, making sure the band/tubing is positioned to resist the entire motion.  Hold this position for 10 seconds.  Have your muscles resist the band/tubing as it slowly pulls your foot back to the starting position. Repeat 3 times. Complete this exercises 2 times per day.  Document Released: 09/03/2005 Document Revised: 11/26/2011 Document Reviewed: 12/16/2008 [employer] Patient Information 2014 [employer]. Diabetes and Foot Care Diabetes may cause you to have problems because of poor blood supply (circulation) to your feet and legs. This may cause the skin on your feet to become thinner, break easier, and heal more slowly. Your skin may become dry, and the skin may peel and crack. You may also have nerve damage in your legs and feet causing decreased feeling in them. You may not notice minor injuries to your feet that could lead to infections or more serious problems. Taking care of your feet is one of the most important things you can do for yourself.  HOME CARE INSTRUCTIONS  Wear shoes at all times, even in the house. Do not go barefoot. Bare feet are easily injured.  Check your feet daily for blisters, cuts, and redness. If you cannot see the bottom of your feet, use a mirror or ask someone for help.  Wash your feet with warm water (do not use hot water) and mild soap. Then pat your feet and the areas between your toes until they are completely dry. Do not soak your feet as this can dry your skin.  Apply a moisturizing lotion or petroleum  jelly (that does not contain alcohol and is unscented) to the skin on your feet and to dry, brittle toenails. Do not apply lotion between your toes.  Trim your toenails straight across. Do not dig under them or around the cuticle. File the edges of your nails with an emery board or nail file.  Do not cut corns or calluses or try to remove them with medicine.  Wear clean socks or stockings every day. Make sure they are not too tight. Do not wear knee-high stockings since they may decrease blood flow to your legs.  Wear shoes that fit properly and have enough cushioning. To break in new shoes, wear them for just a few hours a day. This prevents you from injuring your   feet. Always look in your shoes before you put them on to be sure there are no objects inside.  Do not cross your legs. This may decrease the blood flow to your feet.  If you find a minor scrape, cut, or break in the skin on your feet, keep it and the skin around it clean and dry. These areas may be cleansed with mild soap and water. Do not cleanse the area with peroxide, alcohol, or iodine.  When you remove an adhesive bandage, be sure not to damage the skin around it.  If you have a wound, look at it several times a day to make sure it is healing.  Do not use heating pads or hot water bottles. They may burn your skin. If you have lost feeling in your feet or legs, you may not know it is happening until it is too late.  Make sure your health care provider performs a complete foot exam at least annually or more often if you have foot problems. Report any cuts, sores, or bruises to your health care provider immediately. SEEK MEDICAL CARE IF:   You have an injury that is not healing.  You have cuts or breaks in the skin.  You have an ingrown nail.  You notice redness on your legs or feet.  You feel burning or tingling in your legs or feet.  You have pain or cramps in your legs and feet.  Your legs or feet are numb.  Your  feet always feel cold. SEEK IMMEDIATE MEDICAL CARE IF:   There is increasing redness, swelling, or pain in or around a wound.  There is a red line that goes up your leg.  Pus is coming from a wound.  You develop a fever or as directed by your health care provider.  You notice a bad smell coming from an ulcer or wound. Document Released: 08/31/2000 Document Revised: 05/06/2013 Document Reviewed: 02/10/2013 [employer] Patient Information 2015 [employer]. This information is not intended to replace advice given to you by your health care provider. Make sure you discuss any questions you have with your health care provider.  

## 2014-09-22 NOTE — Progress Notes (Signed)
Chief Complaint  Patient presents with  . Foot Pain    Plantar heel right  "I have got some pain in the heel"  . Debridement    Trim toenails     HPI: Patient is 59 y.o. female who presents today for pain in her right heel and for symptomatic toenails of bilateral feet. The patient is diabetic and states her blood sugar is under good control. She relates pain with first step in the morning or after sitting for long periods of time.   Allergies  Allergen Reactions  . Aspirin Other (See Comments)    Stomach aches   . Fexofenadine     REACTION: Leg and back pain    Physical Exam  Patient is awake, alert, and oriented x 3.  In no acute distress.  Vascular status is intact with palpable pedal pulses at 2/4 DP and PT bilateral and capillary refill time within normal limits. Neurological sensation is also intact bilaterally via Semmes Weinstein monofilament at 5/5 sites. Light touch, vibratory sensation, Achilles tendon reflex is intact. Dermatological exam reveals skin color, turger and texture as normal. No open lesions present.  Patient's toenails are elongated, thickened, discolored, dystrophic symptomatic and mycotic 1 through 4 bilateral.Musculature intact with dorsiflexion, plantarflexion, inversion, eversion.  Elevation plantar medial aspect of the right heel is noted in consistent with plantar fasciitis.   Assessment: plantar fasciitis right, sumptomatic toenails, diabetic  Plan: injected the right heel with Kenalog and Marcaine plain was for injection #1. A thorough debridement of all 10 toenails was accomplished today without complication. She'll gear changes and stretching exercises were also recommended. I will see her back in 3 weeks for recheck

## 2014-10-13 ENCOUNTER — Encounter: Payer: Self-pay | Admitting: Podiatrist

## 2014-10-13 ENCOUNTER — Ambulatory Visit (INDEPENDENT_AMBULATORY_CARE_PROVIDER_SITE_OTHER): Payer: Medicaid Other | Admitting: Podiatrist

## 2014-10-13 VITALS — BP 144/72 | HR 81 | Resp 16

## 2014-10-13 DIAGNOSIS — M722 Plantar fascial fibromatosis: Secondary | ICD-10-CM

## 2014-10-13 NOTE — Progress Notes (Signed)
Injected for number 2 rifht heel  Chief Complaint  Patient presents with  . Plantar Fasciitis    Follow up right heel   "Its doing much better. Even my knee feels better"     HPI: Patient is 59 y.o. female who presents today for follow up of right heel pain.     Allergies  Allergen Reactions  . Aspirin Other (See Comments)    Stomach aches   . Fexofenadine     REACTION: Leg and back pain    Physical Exam  Neurovascular status unchanged.  Moderate pain in the right plantar heel continues to be present but much improved from the previous visit.  Mild swelling still present which is palpable in comparison with the left foot.   Assessment:  Plantar fasciitis right foot-- improving.   Plan: recommended 1 more injection to help resolve the remaining swelling.  The patient agreed and a sterile skin prep was applied.  An injection consisting of kenalog and marcaine mixture was infiltrated at the point of maximal tenderness on the right Heel.  The patient tolerated this well and was given instructions for aftercare.

## 2014-10-13 NOTE — Patient Instructions (Signed)

## 2014-11-15 ENCOUNTER — Emergency Department (HOSPITAL_COMMUNITY)
Admission: EM | Admit: 2014-11-15 | Discharge: 2014-11-15 | Disposition: A | Discharge status: Home / Self Care | Payer: Medicaid Other | Attending: Emergency Medicine | Admitting: Emergency Medicine

## 2014-11-15 ENCOUNTER — Encounter (HOSPITAL_COMMUNITY): Payer: Self-pay | Admitting: Nurse Practitioner

## 2014-11-15 DIAGNOSIS — R109 Unspecified abdominal pain: Secondary | ICD-10-CM | POA: Insufficient documentation

## 2014-11-15 DIAGNOSIS — Z791 Long term (current) use of non-steroidal anti-inflammatories (NSAID): Secondary | ICD-10-CM | POA: Diagnosis not present

## 2014-11-15 DIAGNOSIS — R0981 Nasal congestion: Secondary | ICD-10-CM | POA: Diagnosis present

## 2014-11-15 DIAGNOSIS — J0100 Acute maxillary sinusitis, unspecified: Secondary | ICD-10-CM | POA: Insufficient documentation

## 2014-11-15 DIAGNOSIS — Z79899 Other long term (current) drug therapy: Secondary | ICD-10-CM | POA: Diagnosis not present

## 2014-11-15 DIAGNOSIS — Z859 Personal history of malignant neoplasm, unspecified: Secondary | ICD-10-CM | POA: Diagnosis not present

## 2014-11-15 DIAGNOSIS — E119 Type 2 diabetes mellitus without complications: Secondary | ICD-10-CM | POA: Insufficient documentation

## 2014-11-15 DIAGNOSIS — H9209 Otalgia, unspecified ear: Secondary | ICD-10-CM | POA: Insufficient documentation

## 2014-11-15 DIAGNOSIS — I10 Essential (primary) hypertension: Secondary | ICD-10-CM | POA: Diagnosis not present

## 2014-11-15 MED ORDER — AZITHROMYCIN 250 MG PO TABS: 250.0000 mg | ORAL_TABLET | Freq: Every day | ORAL | Status: DC

## 2014-11-15 NOTE — ED Provider Notes (Signed)
CSN: [id number]     Arrival date & time 11/15/14  1803 History  This chart was scribed for Charlann Lange, PA, working with Hoy Morn, MD by Starleen Arms, ED Scribe. This patient was seen in room WTR8/WTR8 and the patient's care was started at 8:22 PM.    Chief Complaint  Patient presents with  . Nasal Congestion  . Cough   The history is provided by the patient. No language interpreter was used.   HPI Comments: Elizabeth Mclean is a 59 y.o. female with a history of DM who presents to the Emergency Department complaining of chest and sinus congestion onset 1 week ago with associated green mucous production and ear pain.  She also notes some abdominal pain due to coughing.  Patient reports a decreased appetite but is staying well-hydrated.  Patient denies sick contacts.  Patient denies fever, nausea, vomiting.  Patient is allergic to ASA  Past Medical History  Diagnosis Date  . Allergy   . Hypertension   . Arthritis   . Cancer   . Diabetes mellitus without complication    Past Surgical History  Procedure Laterality Date  . Thumb surgery     Family History  Problem Relation Age of Onset  . Diabetes Mother   . Diabetes Father    History  Substance Use Topics  . Smoking status: Unknown If Ever Smoked  . Smokeless tobacco: Not on file  . Alcohol Use: No   OB History    No data available     Review of Systems  Constitutional: Negative for fever.  HENT: Positive for congestion and ear pain.   Gastrointestinal: Negative for nausea, vomiting and abdominal pain.      Allergies  Aspirin and Fexofenadine  Home Medications   Prior to Admission medications   Medication Sig Start Date End Date Taking? Authorizing Provider  ACCU-CHEK AVIVA PLUS test strip  07/22/14   Historical Provider, MD  ACCU-CHEK SOFTCLIX LANCETS lancets  07/27/14   Historical Provider, MD  BD PEN NEEDLE NANO U/F 32G X 4 MM MISC  07/14/14   Historical Provider, MD  benzonatate (TESSALON) 100 MG  capsule Take 100 mg by mouth 3 (three) times daily as needed for cough.    Historical Provider, MD  Blood Glucose Monitoring Suppl (ACCU-CHEK AVIVA PLUS) W/DEVICE KIT  06/07/14   Historical Provider, MD  cetirizine (ZYRTEC) 10 MG tablet Take 10 mg by mouth daily.      Historical Provider, MD  cholecalciferol (VITAMIN D) 1000 UNITS tablet Take 1,000 Units by mouth daily.    Historical Provider, MD  cyclobenzaprine (FLEXERIL) 5 MG tablet Take 1 tablet (5 mg total) by mouth 3 (three) times daily as needed for muscle spasms. 07/17/12   Harden Mo, MD  diclofenac (VOLTAREN) 75 MG EC tablet Take 1 tablet (75 mg total) by mouth 2 (two) times daily. 07/17/12   Harden Mo, MD  LEVEMIR FLEXTOUCH 100 UNIT/ML Pen 28 Units at bedtime.  06/10/14   Historical Provider, MD  NOVOLOG FLEXPEN 100 UNIT/ML FlexPen Sliding scale 06/09/14   Historical Provider, MD  tamoxifen (NOLVADEX) 20 MG tablet Take 1 tablet (20 mg total) by mouth daily. 04/15/14   Minette Headland, NP  traMADol (ULTRAM) 50 MG tablet Take 1 tablet (50 mg total) by mouth every 8 (eight) hours as needed for pain. 11/27/12   Theodis Blaze, MD  valsartan-hydrochlorothiazide (DIOVAN HCT) 160-12.5 MG per tablet Take 1 tablet by mouth daily. 11/18/12  Reyne Dumas, MD  Venlafaxine HCl 75 MG TB24 Take 1 tablet (75 mg total) by mouth daily. 01/26/13   Deatra Robinson, MD   BP 149/88 mmHg  Pulse 90  Temp(Src) 99.2 F (37.3 C) (Oral)  Resp 16  Ht _0  (1.702 m)  Wt 259 lb (117.482 kg)  BMI 40.56 kg/m2  SpO2 98% Physical Exam  Constitutional: She is oriented to person, place, and time. She appears well-developed and well-nourished. No distress.  HENT:  Head: Normocephalic and atraumatic.  Nose: Right sinus exhibits maxillary sinus tenderness. Left sinus exhibits maxillary sinus tenderness.  TM's clear bilaterally.  Oropharynx benign.  Nasal mucosa is edematous.    Eyes: Conjunctivae and EOM are normal.  Neck: Neck supple. No tracheal deviation  present.  Cardiovascular: Normal rate.   Pulmonary/Chest: Effort normal and breath sounds normal. No respiratory distress.  Lungs CTA.  Musculoskeletal: Normal range of motion.  Neurological: She is alert and oriented to person, place, and time.  Skin: Skin is warm and dry.  Psychiatric: She has a normal mood and affect. Her behavior is normal.  Nursing note and vitals reviewed.   ED Course  Procedures (including critical care time)  DIAGNOSTIC STUDIES: Oxygen Saturation is 98% on RA, normal by my interpretation.    COORDINATION OF CARE:  8:34 PM Discussed treatment plan with patient at bedside.  Patient acknowledges and agrees with plan.     Labs Review Labs Reviewed - No data to display  Imaging Review No results found.   EKG Interpretation None      MDM   Final diagnoses:  None    1. Sinusitis  Patient is well appearing, non-toxic, VSS, low grade temp. Will treat sinusitis and encourage PCP follow up for persistent symptoms.   I personally performed the services described in this documentation, which was scribed in my presence. The recorded information has been reviewed and is accurate.     Dewaine Oats, PA-C 11/15/14 2056  Hoy Morn, MD 11/15/14 [phone]

## 2014-11-15 NOTE — ED Notes (Signed)
Pt reports nasal congestion, fatigue, sinus pain, head aches and productive cough. Denies fevers and chills, shortness of breath or chest pain.

## 2014-11-15 NOTE — Discharge Instructions (Signed)
RECOMMEND PLAIN SALINE NASAL SPRAYS, ZYRTEC, TYLENOL. FOLLOW UP WITH YOUR DOCTOR FOR RECHECK IF SYMPTOMS PERSIST.   Sinusitis Sinusitis is redness, soreness, and inflammation of the paranasal sinuses. Paranasal sinuses are air pockets within the bones of your face (beneath the eyes, the middle of the forehead, or above the eyes). In healthy paranasal sinuses, mucus is able to drain out, and air is able to circulate through them by way of your nose. However, when your paranasal sinuses are inflamed, mucus and air can become trapped. This can allow bacteria and other germs to grow and cause infection. Sinusitis can develop quickly and last only a short time (acute) or continue over a long period (chronic). Sinusitis that lasts for more than 12 weeks is considered chronic.  CAUSES  Causes of sinusitis include:  Allergies.  Structural abnormalities, such as displacement of the cartilage that separates your nostrils (deviated septum), which can decrease the air flow through your nose and sinuses and affect sinus drainage.  Functional abnormalities, such as when the small hairs (cilia) that line your sinuses and help remove mucus do not work properly or are not present. SIGNS AND SYMPTOMS  Symptoms of acute and chronic sinusitis are the same. The primary symptoms are pain and pressure around the affected sinuses. Other symptoms include:  Upper toothache.  Earache.  Headache.  Bad breath.  Decreased sense of smell and taste.  A cough, which worsens when you are lying flat.  Fatigue.  Fever.  Thick drainage from your nose, which often is green and may contain pus (purulent).  Swelling and warmth over the affected sinuses. DIAGNOSIS  Your health care provider will perform a physical exam. During the exam, your health care provider may:  Look in your nose for signs of abnormal growths in your nostrils (nasal polyps).  Tap over the affected sinus to check for signs of infection.  View  the inside of your sinuses (endoscopy) using an imaging device that has a light attached (endoscope). If your health care provider suspects that you have chronic sinusitis, one or more of the following tests may be recommended:  Allergy tests.  Nasal culture. A sample of mucus is taken from your nose, sent to a lab, and screened for bacteria.  Nasal cytology. A sample of mucus is taken from your nose and examined by your health care provider to determine if your sinusitis is related to an allergy. TREATMENT  Most cases of acute sinusitis are related to a viral infection and will resolve on their own within 10 days. Sometimes medicines are prescribed to help relieve symptoms (pain medicine, decongestants, nasal steroid sprays, or saline sprays).  However, for sinusitis related to a bacterial infection, your health care provider will prescribe antibiotic medicines. These are medicines that will help kill the bacteria causing the infection.  Rarely, sinusitis is caused by a fungal infection. In theses cases, your health care provider will prescribe antifungal medicine. For some cases of chronic sinusitis, surgery is needed. Generally, these are cases in which sinusitis recurs more than 3 times per year, despite other treatments. HOME CARE INSTRUCTIONS   Drink plenty of water. Water helps thin the mucus so your sinuses can drain more easily.  Use a humidifier.  Inhale steam 3 to 4 times a day (for example, sit in the bathroom with the shower running).  Apply a warm, moist washcloth to your face 3 to 4 times a day, or as directed by your health care provider.  Use saline nasal sprays  to help moisten and clean your sinuses.  Take medicines only as directed by your health care provider.  If you were prescribed either an antibiotic or antifungal medicine, finish it all even if you start to feel better. SEEK IMMEDIATE MEDICAL CARE IF:  You have increasing pain or severe headaches.  You have  nausea, vomiting, or drowsiness.  You have swelling around your face.  You have vision problems.  You have a stiff neck.  You have difficulty breathing. MAKE SURE YOU:   Understand these instructions.  Will watch your condition.  Will get help right away if you are not doing well or get worse. Document Released: 09/03/2005 Document Revised: 01/18/2014 Document Reviewed: 09/18/2011 [facility] Patient Information 2015 [place], [place]. This information is not intended to replace advice given to you by your health care provider. Make sure you discuss any questions you have with your health care provider.

## 2014-12-31 ENCOUNTER — Encounter: Payer: Self-pay | Admitting: Podiatrist

## 2014-12-31 ENCOUNTER — Ambulatory Visit (INDEPENDENT_AMBULATORY_CARE_PROVIDER_SITE_OTHER): Payer: Medicaid Other | Admitting: Podiatrist

## 2014-12-31 ENCOUNTER — Ambulatory Visit: Payer: Self-pay

## 2014-12-31 VITALS — BP 141/73 | HR 83 | Resp 16

## 2014-12-31 DIAGNOSIS — M722 Plantar fascial fibromatosis: Secondary | ICD-10-CM | POA: Diagnosis not present

## 2014-12-31 DIAGNOSIS — R52 Pain, unspecified: Secondary | ICD-10-CM

## 2014-12-31 NOTE — Progress Notes (Signed)
Injected for number 2 rifht heel  Chief Complaint  Patient presents with  . Plantar Fasciitis    "right heel is very painful"     HPI: Patient is 59 y.o. female who presents today for follow up of right heel pain.     Allergies  Allergen Reactions  . Aspirin Other (See Comments)    Stomach aches   . Fexofenadine     REACTION: Leg and back pain    Physical Exam  Neurovascular status unchanged.  Moderate pain in the right plantar heel continues to be present but much improved from the previous visit.  Mild swelling still present which is palpable in comparison with the left foot.   Assessment:  Plantar fasciitis right foot--  Plan: Recommended an air fracture walker and she will obtain this through [facility]. She will wear this for 4 weeks. If there is no improvement in symptoms she will call and we will consider another injection.

## 2014-12-31 NOTE — Patient Instructions (Signed)

## 2015-01-31 ENCOUNTER — Ambulatory Visit (HOSPITAL_BASED_OUTPATIENT_CLINIC_OR_DEPARTMENT_OTHER): Payer: Medicaid Other | Admitting: Hematology and Oncology

## 2015-01-31 ENCOUNTER — Telehealth: Payer: Self-pay | Admitting: Hematology and Oncology

## 2015-01-31 VITALS — BP 144/74 | HR 88 | Temp 98.1°F | Resp 18 | Ht 67.0 in | Wt 270.3 lb

## 2015-01-31 DIAGNOSIS — Z170 Estrogen receptor positive status [ER+]: Secondary | ICD-10-CM

## 2015-01-31 DIAGNOSIS — D0512 Intraductal carcinoma in situ of left breast: Secondary | ICD-10-CM | POA: Diagnosis not present

## 2015-01-31 DIAGNOSIS — M791 Myalgia: Secondary | ICD-10-CM | POA: Diagnosis not present

## 2015-01-31 DIAGNOSIS — N951 Menopausal and female climacteric states: Secondary | ICD-10-CM

## 2015-01-31 NOTE — Progress Notes (Signed)
Patient Care Team: [name], MD as PCP - General  DIAGNOSIS: No matching staging information was found for the patient.  SUMMARY OF ONCOLOGIC HISTORY:  No history exists.    CHIEF COMPLIANT:   INTERVAL HISTORY: Elizabeth Mclean is a     REVIEW OF SYSTEMS:   Constitutional: Denies fevers, chills or abnormal weight loss Eyes: Denies blurriness of vision Ears, nose, mouth, throat, and face: Denies mucositis or sore throat Respiratory: Denies cough, dyspnea or wheezes Cardiovascular: Denies palpitation, chest discomfort or lower extremity swelling Gastrointestinal:  Denies nausea, heartburn or change in bowel habits Skin: Denies abnormal skin rashes Lymphatics: Denies new lymphadenopathy or easy bruising Neurological:Denies numbness, tingling or new weaknesses Behavioral/Psych: Mood is stable, no new changes  Breast:  denies any pain or lumps or nodules in either breasts All other systems were reviewed with the patient and are negative.  I have reviewed the past medical history, past surgical history, social history and family history with the patient and they are unchanged from previous note.  ALLERGIES:  is allergic to aspirin and fexofenadine.  MEDICATIONS:  Current Outpatient Prescriptions  Medication Sig Dispense Refill  . ACCU-CHEK AVIVA PLUS test strip   0  . ACCU-CHEK SOFTCLIX LANCETS lancets   0  . azithromycin (ZITHROMAX) 250 MG tablet Take 1 tablet (250 mg total) by mouth daily. Take first 2 tablets together, then 1 every day until finished. 6 tablet 0  . BD PEN NEEDLE NANO U/F 32G X 4 MM MISC   0  . benzonatate (TESSALON) 100 MG capsule Take 100 mg by mouth 3 (three) times daily as needed for cough.    . Blood Glucose Monitoring Suppl (ACCU-CHEK AVIVA PLUS) W/DEVICE KIT   0  . cetirizine (ZYRTEC) 10 MG tablet Take 10 mg by mouth daily.      . cholecalciferol (VITAMIN D) 1000 UNITS tablet Take 1,000 Units by mouth daily.    . cyclobenzaprine (FLEXERIL)  5 MG tablet Take 1 tablet (5 mg total) by mouth 3 (three) times daily as needed for muscle spasms. 30 tablet 3  . diclofenac (VOLTAREN) 75 MG EC tablet Take 1 tablet (75 mg total) by mouth 2 (two) times daily. 30 tablet 3  . LEVEMIR FLEXTOUCH 100 UNIT/ML Pen 28 Units at bedtime.   0  . NOVOLOG FLEXPEN 100 UNIT/ML FlexPen Sliding scale  0  . tamoxifen (NOLVADEX) 20 MG tablet Take 1 tablet (20 mg total) by mouth daily. 90 tablet 3  . traMADol (ULTRAM) 50 MG tablet Take 1 tablet (50 mg total) by mouth every 8 (eight) hours as needed for pain. 45 tablet 1  . valsartan-hydrochlorothiazide (DIOVAN HCT) 160-12.5 MG per tablet Take 1 tablet by mouth daily. 30 tablet 2  . Venlafaxine HCl 75 MG TB24 Take 1 tablet (75 mg total) by mouth daily. 90 each 6  . [DISCONTINUED] clonazePAM (KLONOPIN) 0.25 MG disintegrating tablet Take 1 tablet (0.25 mg total) by mouth 2 (two) times daily as needed. 60 tablet 0   No current facility-administered medications for this visit.    PHYSICAL EXAMINATION: ECOG PERFORMANCE STATUS: 1 - Symptomatic but completely ambulatory  Filed Vitals:   01/31/15 1039  BP: 144/74  Pulse: 88  Temp: 98.1 F (36.7 C)  Resp: 18   Filed Weights   01/31/15 1039  Weight: 270 lb 4.8 oz (122.607 kg)    GENERAL:alert, no distress and comfortable SKIN: skin color, texture, turgor are normal, no rashes or significant lesions EYES: normal, Conjunctiva are  pink and non-injected, sclera clear OROPHARYNX:no exudate, no erythema and lips, buccal mucosa, and tongue normal  NECK: supple, thyroid normal size, non-tender, without nodularity LYMPH:  no palpable lymphadenopathy in the cervical, axillary or inguinal LUNGS: clear to auscultation and percussion with normal breathing effort HEART: regular rate & rhythm and no murmurs and no lower extremity edema ABDOMEN:abdomen soft, non-tender and normal bowel sounds Musculoskeletal:no cyanosis of digits and no clubbing  NEURO: alert & oriented x  3 with fluent speech, no focal motor/sensory deficits BREAST: No palpable masses or nodules in either right or left breasts. No palpable axillary supraclavicular or infraclavicular adenopathy no breast tenderness or nipple discharge. (exam performed in the presence of a chaperone)  LABORATORY DATA:  I have reviewed the data as listed   Chemistry      Component Value Date/Time   NA 138 07/22/2014 0917   NA 138 12/13/2013 1310   K 4.3 07/22/2014 0917   K 3.9 12/13/2013 1310   CL 104 12/13/2013 1310   CL 108* 01/26/2013 0914   CO2 27 07/22/2014 0917   CO2 22 12/13/2013 1310   BUN 10.2 07/22/2014 0917   BUN 11 12/13/2013 1310   CREATININE 0.8 07/22/2014 0917   CREATININE 0.71 12/13/2013 1310      Component Value Date/Time   CALCIUM 8.6 07/22/2014 0917   CALCIUM 8.5 12/13/2013 1310   ALKPHOS 158* 07/22/2014 0917   ALKPHOS 77 11/18/2012 1030   AST 130* 07/22/2014 0917   AST 120* 11/18/2012 1030   ALT 112* 07/22/2014 0917   ALT 101* 11/18/2012 1030   BILITOT 0.99 07/22/2014 0917   BILITOT 0.6 11/18/2012 1030       Lab Results  Component Value Date   WBC 3.9 07/22/2014   HGB 12.5 07/22/2014   HCT 37.8 07/22/2014   MCV 98.8 07/22/2014   PLT 112* 07/22/2014   NEUTROABS 1.9 07/22/2014     RADIOGRAPHIC STUDIES: I have personally reviewed the radiology reports and agreed with their findings. No results found.   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 She was then begun on tamoxifen in March 2012   Tamoxifen toxicities: 1. Occasional hot flashes 2. Occasional myalgias and arthralgias  Breast Cancer Surveillance: 1. Breast exam 01/31/2015: Normal 2. Mammogram 07/14/2014 No abnormalities. Postsurgical changes. Breast Density Category B. I recommended that she get 3-D mammograms for surveillance. Discussed the  differences between different breast density categories.   Return to clinic in 1 year for follow-up     No orders of the defined types were placed in this encounter.   The patient has a good understanding of the overall plan. she agrees with it. she will call with any problems that may develop before the next visit here.   Rulon Eisenmenger, MD

## 2015-01-31 NOTE — Telephone Encounter (Signed)
Gave avs & calendar for May 2017 °

## 2015-01-31 NOTE — Progress Notes (Signed)
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  #2 she then received radiation therapy to the left breast from 09/21/2010 to 11/09/2010.  #3 she was then started on tamoxifen 20 mg daily in March 2012 completed 01/31/2015.  CHIEF COMPLIANT: Leg swelling, arm swelling and diffuse pains  INTERVAL HISTORY: Elizabeth Mclean is a 59 year old with above-mentioned history of low-grade DCIS currently on hormonal therapy with tamoxifen. She complains of diffuse muscle aches and pains of the point that she is in a lot of pain and tramadol is not helping her. Her primary care physician refer her to pain management so far she has not been able to see them yet. Because of these aches and pains she is unable to exercise.  REVIEW OF SYSTEMS:   Constitutional: Denies fevers, chills or abnormal weight loss Eyes: Denies blurriness of vision Ears, nose, mouth, throat, and face: Denies mucositis or sore throat Respiratory: Denies cough, dyspnea or wheezes Cardiovascular: Denies palpitation, chest discomfort or lower extremity swelling Gastrointestinal:  Denies nausea, heartburn or change in bowel habits Skin: Denies abnormal skin rashes Lymphatics: Denies new lymphadenopathy or easy bruising Neurological:Denies numbness, tingling or new weaknesses, diffuse muscle aches and pains Behavioral/Psych: Mood is stable, no new changes  Breast:  denies any pain or lumps or nodules in either breasts All other systems were reviewed with the patient and are negative.  I have reviewed the past medical history, past surgical history, social history and family history with the patient and they are unchanged from previous note.  ALLERGIES:  is allergic to aspirin and fexofenadine.  MEDICATIONS:  Current  Outpatient Prescriptions  Medication Sig Dispense Refill  . ACCU-CHEK AVIVA PLUS test strip   0  . ACCU-CHEK SOFTCLIX LANCETS lancets   0  . azithromycin (ZITHROMAX) 250 MG tablet Take 1 tablet (250 mg total) by mouth daily. Take first 2 tablets together, then 1 every day until finished. 6 tablet 0  . BD PEN NEEDLE NANO U/F 32G X 4 MM MISC   0  . benzonatate (TESSALON) 100 MG capsule Take 100 mg by mouth 3 (three) times daily as needed for cough.    . Blood Glucose Monitoring Suppl (ACCU-CHEK AVIVA PLUS) W/DEVICE KIT   0  . cetirizine (ZYRTEC) 10 MG tablet Take 10 mg by mouth daily.      . cholecalciferol (VITAMIN D) 1000 UNITS tablet Take 1,000 Units by mouth daily.    . cyclobenzaprine (FLEXERIL) 5 MG tablet Take 1 tablet (5 mg total) by mouth 3 (three) times daily as needed for muscle spasms. 30 tablet 3  . diclofenac (VOLTAREN) 75 MG EC tablet Take 1 tablet (75 mg total) by mouth 2 (two) times daily. 30 tablet 3  . LEVEMIR FLEXTOUCH 100 UNIT/ML Pen 28 Units at bedtime.   0  . NOVOLOG FLEXPEN 100 UNIT/ML FlexPen Sliding scale  0  . tamoxifen (NOLVADEX) 20 MG tablet Take 1 tablet (20 mg total) by mouth daily. 90 tablet 3  . traMADol (ULTRAM) 50 MG tablet Take 1 tablet (50 mg total) by mouth every 8 (eight) hours as needed for pain. 45 tablet 1  . valsartan-hydrochlorothiazide (DIOVAN HCT) 160-12.5 MG per tablet Take 1 tablet by mouth daily. 30 tablet 2  . Venlafaxine HCl 75 MG TB24 Take 1 tablet (75 mg  total) by mouth daily. 90 each 6  . [DISCONTINUED] clonazePAM (KLONOPIN) 0.25 MG disintegrating tablet Take 1 tablet (0.25 mg total) by mouth 2 (two) times daily as needed. 60 tablet 0   No current facility-administered medications for this visit.    PHYSICAL EXAMINATION: ECOG PERFORMANCE STATUS: 1 - Symptomatic but completely ambulatory  Filed Vitals:   01/31/15 1039  BP: 144/74  Pulse: 88  Temp: 98.1 F (36.7 C)  Resp: 18   Filed Weights   01/31/15 1039  Weight: 270 lb 4.8 oz  (122.607 kg)    GENERAL:alert, no distress and comfortable SKIN: skin color, texture, turgor are normal, no rashes or significant lesions EYES: normal, Conjunctiva are pink and non-injected, sclera clear OROPHARYNX:no exudate, no erythema and lips, buccal mucosa, and tongue normal  NECK: supple, thyroid normal size, non-tender, without nodularity LYMPH:  no palpable lymphadenopathy in the cervical, axillary or inguinal LUNGS: clear to auscultation and percussion with normal breathing effort HEART: regular rate & rhythm and no murmurs and no lower extremity edema ABDOMEN:abdomen soft, non-tender and normal bowel sounds Musculoskeletal:no cyanosis of digits and no clubbing  NEURO: alert & oriented x 3 with fluent speech, no focal motor/sensory deficits BREAST: No palpable masses or nodules in either right or left breasts. No palpable axillary supraclavicular or infraclavicular adenopathy no breast tenderness or nipple discharge. (exam performed in the presence of a chaperone)  LABORATORY DATA:  I have reviewed the data as listed   Chemistry      Component Value Date/Time   NA 138 07/22/2014 0917   NA 138 12/13/2013 1310   K 4.3 07/22/2014 0917   K 3.9 12/13/2013 1310   CL 104 12/13/2013 1310   CL 108* 01/26/2013 0914   CO2 27 07/22/2014 0917   CO2 22 12/13/2013 1310   BUN 10.2 07/22/2014 0917   BUN 11 12/13/2013 1310   CREATININE 0.8 07/22/2014 0917   CREATININE 0.71 12/13/2013 1310      Component Value Date/Time   CALCIUM 8.6 07/22/2014 0917   CALCIUM 8.5 12/13/2013 1310   ALKPHOS 158* 07/22/2014 0917   ALKPHOS 77 11/18/2012 1030   AST 130* 07/22/2014 0917   AST 120* 11/18/2012 1030   ALT 112* 07/22/2014 0917   ALT 101* 11/18/2012 1030   BILITOT 0.99 07/22/2014 0917   BILITOT 0.6 11/18/2012 1030       Lab Results  Component Value Date   WBC 3.9 07/22/2014   HGB 12.5 07/22/2014   HCT 37.8 07/22/2014   MCV 98.8 07/22/2014   PLT 112* 07/22/2014   NEUTROABS 1.9  07/22/2014     RADIOGRAPHIC STUDIES: I have personally reviewed the radiology reports and agreed with their findings. 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 She was then begun on tamoxifen in March 2012 completed 01/31/2015 (stopped early due to myalgias, swelling in extremities and pain)  Tamoxifen toxicities: (Discontinued 01/31/2015) 1. hot flashes 2. myalgias and arthralgias  Breast Cancer Surveillance: 1. Breast exam 01/31/2015: Normal 2. Mammogram 07/14/2014 No abnormalities. Postsurgical changes. Breast Density Category B. I recommended that she get 3-D mammograms for surveillance. Discussed the differences between different breast density categories.   Diabetes and related complications requiring hospitalization in April 2016: Follow with primary care physician Return to clinic in 1 year for follow-up    No orders of  the defined types were placed in this encounter.   The patient has a good understanding of the overall plan. she agrees with it. she will call with any problems that may develop before the next visit here.   Rulon Eisenmenger, MD

## 2015-01-31 NOTE — Assessment & Plan Note (Signed)
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 She was then begun on tamoxifen in March 2012   Tamoxifen toxicities: 1. Occasional hot flashes 2. Occasional myalgias and arthralgias  Breast Cancer Surveillance: 1. Breast exam 01/31/2015: Normal 2. Mammogram 07/14/2014 No abnormalities. Postsurgical changes. Breast Density Category B. I recommended that she get 3-D mammograms for surveillance. Discussed the differences between different breast density categories.   Return to clinic in 1 year for follow-up

## 2015-02-09 ENCOUNTER — Encounter: Payer: Self-pay | Admitting: Podiatry

## 2015-02-09 ENCOUNTER — Ambulatory Visit (INDEPENDENT_AMBULATORY_CARE_PROVIDER_SITE_OTHER): Payer: Medicaid Other | Admitting: Podiatry

## 2015-02-09 VITALS — BP 117/72 | HR 88 | Resp 15

## 2015-02-09 DIAGNOSIS — M779 Enthesopathy, unspecified: Secondary | ICD-10-CM

## 2015-02-09 DIAGNOSIS — M722 Plantar fascial fibromatosis: Secondary | ICD-10-CM

## 2015-02-09 DIAGNOSIS — M7750 Other enthesopathy of unspecified foot: Secondary | ICD-10-CM

## 2015-02-09 MED ORDER — TRIAMCINOLONE ACETONIDE 10 MG/ML IJ SUSP
10.0000 mg | Freq: Once | INTRAMUSCULAR | Status: AC
  Administered 2015-02-09: 10 mg

## 2015-02-11 NOTE — Progress Notes (Signed)
Subjective:     Patient ID: Elizabeth Mclean, female   DOB: [birth year], 59 y.o.   MRN: [id number]  HPI patient states I'm doing better some than previously but I'm still getting pain in my heel   Review of Systems     Objective:   Physical Exam Neurovascular status intact with discomfort in the plantar heel right still present but improved from previous    Assessment:     Plantar fasciitis right still present but improved    Plan:     Reinjected the plantar fascia 3 mg Kenalog 5 mg Xylocaine and instructed on physical therapy

## 2015-06-20 ENCOUNTER — Other Ambulatory Visit: Payer: Self-pay | Admitting: Internal Medicine

## 2015-06-20 DIAGNOSIS — Z853 Personal history of malignant neoplasm of breast: Secondary | ICD-10-CM

## 2015-07-18 ENCOUNTER — Ambulatory Visit
Admission: RE | Admit: 2015-07-18 | Discharge: 2015-07-18 | Disposition: A | Discharge status: Home / Self Care | Payer: Medicaid Other | Source: Ambulatory Visit | Attending: Internal Medicine | Admitting: Internal Medicine

## 2015-07-18 DIAGNOSIS — Z853 Personal history of malignant neoplasm of breast: Secondary | ICD-10-CM

## 2015-07-27 ENCOUNTER — Ambulatory Visit: Payer: Medicaid Other

## 2015-08-15 ENCOUNTER — Encounter: Payer: Self-pay | Admitting: Podiatry

## 2015-08-15 ENCOUNTER — Ambulatory Visit (INDEPENDENT_AMBULATORY_CARE_PROVIDER_SITE_OTHER): Payer: Medicaid Other | Admitting: Podiatry

## 2015-08-15 VITALS — BP 146/81 | HR 90 | Resp 12

## 2015-08-15 DIAGNOSIS — M722 Plantar fascial fibromatosis: Secondary | ICD-10-CM

## 2015-08-15 MED ORDER — TRIAMCINOLONE ACETONIDE 10 MG/ML IJ SUSP
10.0000 mg | Freq: Once | INTRAMUSCULAR | Status: AC
  Administered 2015-08-15: 10 mg

## 2015-08-16 NOTE — Progress Notes (Signed)
Subjective:     Patient ID: Elizabeth Mclean, female   DOB: Feb 03, 1956, 59 y.o.   MRN: [id number]  HPI patient presents stating I'm getting a lot of pain in the bottom my right heel that has reoccurred   Review of Systems     Objective:   Physical Exam Neurovascular status intact with continued discomfort plantar aspect right heel at the insertion tendon into the calcaneus    Assessment:     Planter fasciitis right     Plan:     Reinjected the plantar fascia right 3 mg Kenalog 5 mg Xylocaine and instructed on physical therapy. Dispensed night splint with instructions on usage

## 2015-10-19 ENCOUNTER — Emergency Department (HOSPITAL_COMMUNITY)
Admission: EM | Admit: 2015-10-19 | Discharge: 2015-10-19 | Disposition: A | Discharge status: Home / Self Care | Payer: Medicaid Other | Attending: Emergency Medicine | Admitting: Emergency Medicine

## 2015-10-19 ENCOUNTER — Encounter (HOSPITAL_COMMUNITY): Payer: Self-pay | Admitting: Emergency Medicine

## 2015-10-19 DIAGNOSIS — Z859 Personal history of malignant neoplasm, unspecified: Secondary | ICD-10-CM | POA: Diagnosis not present

## 2015-10-19 DIAGNOSIS — E119 Type 2 diabetes mellitus without complications: Secondary | ICD-10-CM | POA: Diagnosis not present

## 2015-10-19 DIAGNOSIS — M1990 Unspecified osteoarthritis, unspecified site: Secondary | ICD-10-CM | POA: Diagnosis not present

## 2015-10-19 DIAGNOSIS — Z791 Long term (current) use of non-steroidal anti-inflammatories (NSAID): Secondary | ICD-10-CM | POA: Insufficient documentation

## 2015-10-19 DIAGNOSIS — I10 Essential (primary) hypertension: Secondary | ICD-10-CM | POA: Insufficient documentation

## 2015-10-19 DIAGNOSIS — R0781 Pleurodynia: Secondary | ICD-10-CM | POA: Diagnosis present

## 2015-10-19 DIAGNOSIS — R0789 Other chest pain: Secondary | ICD-10-CM | POA: Insufficient documentation

## 2015-10-19 DIAGNOSIS — Z79899 Other long term (current) drug therapy: Secondary | ICD-10-CM | POA: Insufficient documentation

## 2015-10-19 MED ORDER — NAPROXEN 500 MG PO TABS: 500.0000 mg | ORAL_TABLET | Freq: Two times a day (BID) | ORAL | Status: DC

## 2015-10-19 MED ORDER — ONDANSETRON 4 MG PO TBDP: ORAL_TABLET | ORAL | Status: DC

## 2015-10-19 MED ORDER — NAPROXEN 500 MG PO TABS
500.0000 mg | ORAL_TABLET | Freq: Two times a day (BID) | ORAL | Status: DC
  Administered 2015-10-19: 500 mg via ORAL
  Filled 2015-10-19: qty 1

## 2015-10-19 MED ORDER — ONDANSETRON 4 MG PO TBDP
4.0000 mg | ORAL_TABLET | Freq: Once | ORAL | Status: AC
  Administered 2015-10-19: 4 mg via ORAL
  Filled 2015-10-19: qty 1

## 2015-10-19 NOTE — Discharge Instructions (Signed)
Please follow-up with your doctor next week for reevaluation. Take your medications as prescribed and with food. Return to ED for any new or worsening symptoms as we discussed.

## 2015-10-19 NOTE — ED Provider Notes (Signed)
CSN: [id number]     Arrival date & time 10/19/15  1423 History  By signing my name below, I, Elizabeth Mclean, attest that this documentation has been prepared under the direction and in the presence of [employer], PA-C. Electronically Signed: Eustaquio Mclean, ED Scribe. 10/19/2015. 2:54 PM.   Chief Complaint  Patient presents with  . thoracic pain     right   The history is provided by the patient. No language interpreter was used.     HPI Comments: Elizabeth Mclean is a 60 y.o. female who presents to the Emergency Department complaining of gradual onset, intermittent, moderate, worsening, right lateral rib paint x 1 week. Pt states that the pain has moved to underneath her right breast now. No known injury to the chest but pt states prior to the pain beginning she reached back with her right arm behind her driver's seat while driving and felt a sudden catch. The pain is only present with bending and certain movement. Pt has been taking a muscle relaxant and Tramadol without relief. Denies shortness of breath, chest pain, abdominal pain, nausea, vomiting, cough, fever, urinary changes, rash, or any other associated symptoms.  Given muscle relaxant and Tramadol without relief. No hx kidney or stomach issues.   Past Medical History  Diagnosis Date  . Allergy   . Hypertension   . Arthritis   . Cancer ([place])   . Diabetes mellitus without complication [facility])    Past Surgical History  Procedure Laterality Date  . Thumb surgery     Family History  Problem Relation Age of Onset  . Diabetes Mother   . Diabetes Father    Social History  Substance Use Topics  . Smoking status: Unknown If Ever Smoked  . Smokeless tobacco: None  . Alcohol Use: No   OB History    No data available     Review of Systems  A complete 10 system review of systems was obtained and all systems are negative except as noted in the HPI and PMH.   Allergies  Aspirin and Fexofenadine  Home Medications    Prior to Admission medications   Medication Sig Start Date End Date Taking? Authorizing Provider  ACCU-CHEK AVIVA PLUS test strip  07/22/14   Historical Provider, MD  ACCU-CHEK SOFTCLIX LANCETS lancets  07/27/14   Historical Provider, MD  azithromycin (ZITHROMAX) 250 MG tablet Take 1 tablet (250 mg total) by mouth daily. Take first 2 tablets together, then 1 every day until finished. 11/15/14   Charlann Lange, PA-C  BD PEN NEEDLE NANO U/F 32G X 4 MM MISC  07/14/14   Historical Provider, MD  benzonatate (TESSALON) 100 MG capsule Take 100 mg by mouth 3 (three) times daily as needed for cough.    Historical Provider, MD  Blood Glucose Monitoring Suppl (ACCU-CHEK AVIVA PLUS) W/DEVICE KIT  06/07/14   Historical Provider, MD  cetirizine (ZYRTEC) 10 MG tablet Take 10 mg by mouth daily.      Historical Provider, MD  cholecalciferol (VITAMIN D) 1000 UNITS tablet Take 1,000 Units by mouth daily.    Historical Provider, MD  cyclobenzaprine (FLEXERIL) 5 MG tablet Take 1 tablet (5 mg total) by mouth 3 (three) times daily as needed for muscle spasms. 07/17/12   Harden Mo, MD  diclofenac (VOLTAREN) 75 MG EC tablet Take 1 tablet (75 mg total) by mouth 2 (two) times daily. 07/17/12   Harden Mo, MD  LEVEMIR FLEXTOUCH 100 UNIT/ML Pen 28 Units at bedtime.  06/10/14   Historical Provider, MD  naproxen (NAPROSYN) 500 MG tablet Take 1 tablet (500 mg total) by mouth 2 (two) times daily. 10/19/15    , PA-C  NOVOLOG FLEXPEN 100 UNIT/ML FlexPen Sliding scale 06/09/14   Historical Provider, MD  tamoxifen (NOLVADEX) 20 MG tablet Take 1 tablet (20 mg total) by mouth daily. 04/15/14   Lindsey N Cornetto, NP  traMADol (ULTRAM) 50 MG tablet Take 1 tablet (50 mg total) by mouth every 8 (eight) hours as needed for pain. 11/27/12   Iskra M Myers, MD  valsartan-hydrochlorothiazide (DIOVAN HCT) 160-12.5 MG per tablet Take 1 tablet by mouth daily. 11/18/12   Nayana Abrol, MD  Venlafaxine HCl 75 MG TB24 Take 1 tablet (75  mg total) by mouth daily. 01/26/13   Kalsoon Khan, MD   BP 157/85 mmHg  Pulse 91  Temp(Src) 98.5 F (36.9 C) (Oral)  Resp 18  SpO2 98%   Physical Exam  Constitutional: She is oriented to person, place, and time. She appears well-developed and well-nourished. No distress.  HENT:  Head: Normocephalic and atraumatic.  Eyes: Conjunctivae and EOM are normal.  Neck: Neck supple. No tracheal deviation present.  Cardiovascular: Normal rate, regular rhythm and normal heart sounds.   Pulmonary/Chest: Effort normal and breath sounds normal. No respiratory distress. She has no wheezes. She has no rales.  Tenderness diffuse to right anterolateral inferior ribs at the level of the breast. No lesions, erythema, warmth, crepitus or other abnormal findings. No rash  Musculoskeletal: Normal range of motion.  Neurological: She is alert and oriented to person, place, and time.  Skin: Skin is warm and dry.  Psychiatric: She has a normal mood and affect. Her behavior is normal.  Nursing note and vitals reviewed.   ED Course  Procedures (including critical care time)  DIAGNOSTIC STUDIES: Oxygen Saturation is 98% on RA, normal by my interpretation.    COORDINATION OF CARE: 2:52 PM-Discussed treatment plan which includes Rx 500 mg Aleve with pt at bedside and pt agreed to plan.   Labs Review Labs Reviewed - No data to display  Imaging Review No results found.   EKG Interpretation None      Meds given in ED:  Medications  naproxen (NAPROSYN) tablet 500 mg (500 mg Oral Given 10/19/15 1516)  ondansetron (ZOFRAN-ODT) disintegrating tablet 4 mg (4 mg Oral Given 10/19/15 1516)    New Prescriptions   NAPROXEN (NAPROSYN) 500 MG TABLET    Take 1 tablet (500 mg total) by mouth 2 (two) times daily.    Filed Vitals:   10/19/15 1434  BP: 157/85  Pulse: 91  Temp: 98.5 F (36.9 C)  Resp: 18    MDM  Elizabeth Mclean is a 59 y.o. female because of her evaluation of right rib pain. Symptoms  started after reaching backwards in her car. She has no fever, chills, cardiomegaly pulmonary complaints, cough, shortness of breath. Discomfort is replicated with palpation of inferior anterolateral ribs. Consistent with chest wall pain. Plan to discharge with NSAIDs, follow up with PCP next week for reevaluation. Low suspicion for ACS, PE or other acute cardio pulmonary pathology. Doubt zoster. Patient otherwise is hemodynamically stable, afebrile and appropriate for outpatient follow-up. Patient discharged with prescription for Aleve. She voices no other questions or concerns prior to discharge. Final diagnoses:  Right-sided chest wall pain   I personally performed the services described in this documentation, which was scribed in my presence. The recorded information has been reviewed and is accurate.         , PA-C 10/19/15 1519  Elliott Wentz, MD 10/19/15 1615 

## 2015-10-19 NOTE — ED Notes (Signed)
Pt reports right thoracic/ ribs pain x 1 week, worse with motion. Pt denies nausea nor abd pain. Pt also denies urinary symptoms. No fall nor injury. Alert oriented x 4.

## 2015-10-26 ENCOUNTER — Other Ambulatory Visit: Payer: Self-pay | Admitting: Internal Medicine

## 2015-10-27 ENCOUNTER — Other Ambulatory Visit (HOSPITAL_COMMUNITY): Payer: Self-pay | Admitting: Internal Medicine

## 2016-01-11 ENCOUNTER — Ambulatory Visit (INDEPENDENT_AMBULATORY_CARE_PROVIDER_SITE_OTHER): Payer: Medicaid Other | Admitting: Podiatry

## 2016-01-11 ENCOUNTER — Encounter: Payer: Self-pay | Admitting: Podiatry

## 2016-01-11 ENCOUNTER — Ambulatory Visit (INDEPENDENT_AMBULATORY_CARE_PROVIDER_SITE_OTHER): Payer: Medicaid Other

## 2016-01-11 VITALS — BP 145/75 | HR 95 | Resp 16

## 2016-01-11 DIAGNOSIS — M722 Plantar fascial fibromatosis: Secondary | ICD-10-CM | POA: Diagnosis not present

## 2016-01-11 MED ORDER — TRIAMCINOLONE ACETONIDE 10 MG/ML IJ SUSP
10.0000 mg | Freq: Once | INTRAMUSCULAR | Status: AC
  Administered 2016-01-11: 10 mg

## 2016-01-11 NOTE — Progress Notes (Signed)
Subjective:     Patient ID: Elizabeth Mclean, female   DOB: 04/15/1956, 60 y.o.   MRN: [id number]  HPI patient states I have a lot of pain in the bottom of my right heel and I'm still getting pain in the back of my left heel that has started to become limiting for me   Review of Systems     Objective:   Physical Exam Neurovascular status intact muscle strength adequate with exquisite discomfort plantar aspect of the right heel and discomfort on the posterior lateral aspect of the left heel not in the central or medial part of the Achilles tendon    Assessment:     Plantar fasciitis right and lateral band acute Achilles tendinitis left    Plan:     H&P and conditions reviewed with patient. Today I did a careful plantar injection right 3 mg Kenalog 5 mg Xylocaine and for the left I discussed a careful injection of the outside explaining the chances for rupture associated with injection treatment. Patient wants procedure and today I did a careful lateral injection 3 mg Dexon some Kenalog 5 mill grams Xylocaine and not putting it directly into the tendon but to the lateral side and advised on reduced activity

## 2016-01-30 ENCOUNTER — Ambulatory Visit (HOSPITAL_BASED_OUTPATIENT_CLINIC_OR_DEPARTMENT_OTHER): Payer: Medicaid Other | Admitting: Hematology and Oncology

## 2016-01-30 ENCOUNTER — Telehealth: Payer: Self-pay | Admitting: Hematology and Oncology

## 2016-01-30 ENCOUNTER — Encounter: Payer: Self-pay | Admitting: Hematology and Oncology

## 2016-01-30 VITALS — BP 158/65 | HR 96 | Temp 98.2°F | Resp 18 | Wt 282.1 lb

## 2016-01-30 DIAGNOSIS — D0512 Intraductal carcinoma in situ of left breast: Secondary | ICD-10-CM | POA: Diagnosis present

## 2016-01-30 DIAGNOSIS — E119 Type 2 diabetes mellitus without complications: Secondary | ICD-10-CM | POA: Diagnosis not present

## 2016-01-30 NOTE — Telephone Encounter (Signed)
appt made and avs printed °

## 2016-01-30 NOTE — Progress Notes (Signed)
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  #2 she then received radiation therapy to the left breast from 09/21/2010 to 11/09/2010.  #3 she was then started on tamoxifen 20 mg daily in March 2012 completed 01/31/2015.  CHIEF COMPLIANT: Leg swelling, arm swelling and diffuse pains  INTERVAL HISTORY: [place] is a 60 year old with above-mentioned history of low-grade DCIS currently on surveillance. She continues to have problems with myalgias and does water exercises. She is still fairly obese and has not been able to lose weight. Her pain issues are being managed by her primary care physician.  REVIEW OF SYSTEMS:   Constitutional: Denies fevers, chills or abnormal weight loss Eyes: Denies blurriness of vision Ears, nose, mouth, throat, and face: Denies mucositis or sore throat Respiratory: Denies cough, dyspnea or wheezes Cardiovascular: Denies palpitation, chest discomfort Gastrointestinal:  Denies nausea, heartburn or change in bowel habits Skin: Denies abnormal skin rashes Lymphatics: Denies new lymphadenopathy or easy bruising Neurological:diffuse muscle skeletal aches and pains Behavioral/Psych: Mood is stable, no new changes  Extremities: No lower extremity edema Breast:  denies any pain or lumps or nodules in either breasts All other systems were reviewed with the patient and are negative.  I have reviewed the past medical history, past surgical history, social history and family history with the patient and they are unchanged from previous note.  ALLERGIES:  is allergic to aspirin and fexofenadine.  MEDICATIONS:  Current Outpatient Prescriptions  Medication Sig Dispense Refill  . ACCU-CHEK AVIVA PLUS test strip   0  . ACCU-CHEK  SOFTCLIX LANCETS lancets   0  . azithromycin (ZITHROMAX) 250 MG tablet Take 1 tablet (250 mg total) by mouth daily. Take first 2 tablets together, then 1 every day until finished. 6 tablet 0  . BD PEN NEEDLE NANO U/F 32G X 4 MM MISC   0  . benzonatate (TESSALON) 100 MG capsule Take 100 mg by mouth 3 (three) times daily as needed for cough.    . Blood Glucose Monitoring Suppl (ACCU-CHEK AVIVA PLUS) W/DEVICE KIT   0  . cetirizine (ZYRTEC) 10 MG tablet Take 10 mg by mouth daily.      . cholecalciferol (VITAMIN D) 1000 UNITS tablet Take 1,000 Units by mouth daily.    . cyclobenzaprine (FLEXERIL) 5 MG tablet Take 1 tablet (5 mg total) by mouth 3 (three) times daily as needed for muscle spasms. 30 tablet 3  . diclofenac (VOLTAREN) 75 MG EC tablet Take 1 tablet (75 mg total) by mouth 2 (two) times daily. 30 tablet 3  . LEVEMIR FLEXTOUCH 100 UNIT/ML Pen 28 Units at bedtime.   0  . naproxen (NAPROSYN) 500 MG tablet Take 1 tablet (500 mg total) by mouth 2 (two) times daily. 30 tablet 0  . NOVOLOG FLEXPEN 100 UNIT/ML FlexPen Sliding scale  0  . ondansetron (ZOFRAN ODT) 4 MG disintegrating tablet 70m ODT q4 hours prn nausea/vomit 4 tablet 0  . tamoxifen (NOLVADEX) 20 MG tablet Take 1 tablet (20 mg total) by mouth daily. 90 tablet 3  . traMADol (ULTRAM) 50 MG tablet Take 1 tablet (50 mg total) by mouth every 8 (eight) hours as needed for pain. 45 tablet 1  . valsartan-hydrochlorothiazide (DIOVAN HCT) 160-12.5 MG per tablet Take 1 tablet by mouth daily.  30 tablet 2  . Venlafaxine HCl 75 MG TB24 Take 1 tablet (75 mg total) by mouth daily. 90 each 6  . [DISCONTINUED] clonazePAM (KLONOPIN) 0.25 MG disintegrating tablet Take 1 tablet (0.25 mg total) by mouth 2 (two) times daily as needed. 60 tablet 0   No current facility-administered medications for this visit.    PHYSICAL EXAMINATION: ECOG PERFORMANCE STATUS: 1 - Symptomatic but completely ambulatory  Filed Vitals:   01/30/16 1115  BP: 158/65  Pulse: 96    Temp: 98.2 F (36.8 C)  Resp: 18   Filed Weights   01/30/16 1115  Weight: 282 lb 1 oz (127.943 kg)    GENERAL:alert, no distress and comfortable SKIN: skin color, texture, turgor are normal, no rashes or significant lesions EYES: normal, Conjunctiva are pink and non-injected, sclera clear OROPHARYNX:no exudate, no erythema and lips, buccal mucosa, and tongue normal  NECK: supple, thyroid normal size, non-tender, without nodularity LYMPH:  no palpable lymphadenopathy in the cervical, axillary or inguinal LUNGS: clear to auscultation and percussion with normal breathing effort HEART: regular rate & rhythm and no murmurs and no lower extremity edema ABDOMEN:abdomen soft, non-tender and normal bowel sounds MUSCULOSKELETAL:no cyanosis of digits and no clubbing  NEURO: alert & oriented x 3 with fluent speech, no focal motor/sensory deficits EXTREMITIES: No lower extremity edema BREAST: No palpable masses or nodules in either right or left breasts. No palpable axillary supraclavicular or infraclavicular adenopathy no breast tenderness or nipple discharge. (exam performed in the presence of a chaperone)  LABORATORY DATA:  I have reviewed the data as listed   Chemistry      Component Value Date/Time   NA 138 07/22/2014 0917   NA 138 12/13/2013 1310   K 4.3 07/22/2014 0917   K 3.9 12/13/2013 1310   CL 104 12/13/2013 1310   CL 108* 01/26/2013 0914   CO2 27 07/22/2014 0917   CO2 22 12/13/2013 1310   BUN 10.2 07/22/2014 0917   BUN 11 12/13/2013 1310   CREATININE 0.8 07/22/2014 0917   CREATININE 0.71 12/13/2013 1310      Component Value Date/Time   CALCIUM 8.6 07/22/2014 0917   CALCIUM 8.5 12/13/2013 1310   ALKPHOS 158* 07/22/2014 0917   ALKPHOS 77 11/18/2012 1030   AST 130* 07/22/2014 0917   AST 120* 11/18/2012 1030   ALT 112* 07/22/2014 0917   ALT 101* 11/18/2012 1030   BILITOT 0.99 07/22/2014 0917   BILITOT 0.6 11/18/2012 1030       Lab Results  Component Value Date    WBC 3.9 07/22/2014   HGB 12.5 07/22/2014   HCT 37.8 07/22/2014   MCV 98.8 07/22/2014   PLT 112* 07/22/2014   NEUTROABS 1.9 07/22/2014     ASSESSMENT & PLAN:  Ductal carcinoma in situ (DCIS) of left breast DCIS of the left breast status post lumpectomy in December 2011 followed by radiation therapy which he completed in February 2012. She was then begun on tamoxifen in March 2012 completed 01/31/2015 (stopped early due to myalgias, swelling in extremities and pain)  Breast Cancer Surveillance: 1. Breast exam 01/30/2016: Normal 2. Mammogram 07/18/2015 No abnormalities. Postsurgical changes. Breast Density Category B. I recommended that she get 3-D mammograms for surveillance. Discussed the differences between different breast density categories.  Diabetes and related complications: follows with her PCP Return to clinic in 1 year for follow-up With survivorship clinic     Orders Placed This Encounter  Procedures  . Amb Referral to Survivorship Long term  Referral Priority:  Routine    Referral Type:  Consultation    Number of Visits Requested:  1   The patient has a good understanding of the overall plan. she agrees with it. she will call with any problems that may develop before the next visit here.   Rulon Eisenmenger, MD 01/30/2016

## 2016-01-30 NOTE — Assessment & Plan Note (Signed)
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 She was then begun on tamoxifen in March 2012 completed 01/31/2015 (stopped early due to myalgias, swelling in extremities and pain)  Breast Cancer Surveillance: 1. Breast exam 01/30/2016: Normal 2. Mammogram 07/18/2015 No abnormalities. Postsurgical changes. Breast Density Category B. I recommended that she get 3-D mammograms for surveillance. Discussed the differences between different breast density categories.  Diabetes and related complications: follows with her PCP Return to clinic in 1 year for follow-up With survivorship clinic

## 2016-02-02 ENCOUNTER — Ambulatory Visit (INDEPENDENT_AMBULATORY_CARE_PROVIDER_SITE_OTHER): Payer: Medicaid Other | Admitting: Podiatry

## 2016-02-02 ENCOUNTER — Encounter: Payer: Self-pay | Admitting: Podiatry

## 2016-02-02 VITALS — BP 132/67 | HR 88 | Resp 16

## 2016-02-02 DIAGNOSIS — M722 Plantar fascial fibromatosis: Secondary | ICD-10-CM | POA: Diagnosis not present

## 2016-02-02 NOTE — Progress Notes (Signed)
Subjective:     Patient ID: Elizabeth Mclean, female   DOB: January 10, 1956, 60 y.o.   MRN: [id number]  HPI patient states that she is still getting some pain in the bottom of the right heel but improved and her left Achilles as much better   Review of Systems     Objective:   Physical Exam Neurovascular status intact with 2 separate problems on each heel    Assessment:     Plantar fasciitis right Achilles tendinitis left that's improved quite markedly with moderate discomfort plantar heel right    Plan:     Reviewed both conditions and discussed stretching exercises for both in shoe gear modifications and for the right advised that we may be able to inject this at one point in the future but I like to wait at least 4 months before considering this. Do not recommend further treatment except I do not want her going without shoes

## 2016-02-15 ENCOUNTER — Other Ambulatory Visit: Payer: Self-pay | Admitting: Primary Care

## 2016-02-16 ENCOUNTER — Other Ambulatory Visit: Payer: Self-pay | Admitting: Primary Care

## 2016-02-16 DIAGNOSIS — R1011 Right upper quadrant pain: Secondary | ICD-10-CM

## 2016-02-20 ENCOUNTER — Encounter (HOSPITAL_COMMUNITY): Payer: Self-pay | Admitting: Emergency Medicine

## 2016-02-20 ENCOUNTER — Emergency Department (HOSPITAL_COMMUNITY)
Admission: EM | Admit: 2016-02-20 | Discharge: 2016-02-21 | Disposition: A | Discharge status: Home / Self Care | Payer: Medicaid Other | Attending: Emergency Medicine | Admitting: Emergency Medicine

## 2016-02-20 DIAGNOSIS — E119 Type 2 diabetes mellitus without complications: Secondary | ICD-10-CM | POA: Insufficient documentation

## 2016-02-20 DIAGNOSIS — Z87891 Personal history of nicotine dependence: Secondary | ICD-10-CM | POA: Insufficient documentation

## 2016-02-20 DIAGNOSIS — Z79899 Other long term (current) drug therapy: Secondary | ICD-10-CM | POA: Insufficient documentation

## 2016-02-20 DIAGNOSIS — I10 Essential (primary) hypertension: Secondary | ICD-10-CM | POA: Diagnosis not present

## 2016-02-20 DIAGNOSIS — Z794 Long term (current) use of insulin: Secondary | ICD-10-CM | POA: Diagnosis not present

## 2016-02-20 DIAGNOSIS — N852 Hypertrophy of uterus: Secondary | ICD-10-CM | POA: Insufficient documentation

## 2016-02-20 DIAGNOSIS — Z859 Personal history of malignant neoplasm, unspecified: Secondary | ICD-10-CM | POA: Insufficient documentation

## 2016-02-20 DIAGNOSIS — R1030 Lower abdominal pain, unspecified: Secondary | ICD-10-CM | POA: Diagnosis present

## 2016-02-20 HISTORY — DX: Unspecified viral hepatitis C without hepatic coma: B19.20

## 2016-02-20 LAB — URINALYSIS, ROUTINE W REFLEX MICROSCOPIC
BILIRUBIN URINE: NEGATIVE
GLUCOSE, UA: NEGATIVE mg/dL
Hgb urine dipstick: NEGATIVE
KETONES UR: NEGATIVE mg/dL
Leukocytes, UA: NEGATIVE
NITRITE: NEGATIVE
PH: 5.5 (ref 5.0–8.0)
Protein, ur: NEGATIVE mg/dL
Specific Gravity, Urine: 1.019 (ref 1.005–1.030)
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
## 2016-02-20 NOTE — ED Notes (Signed)
Pt states 2 weeks ago she started having difficulty urinating and was seen by her PCP and was started on Vesicare  Pt states the problem improved and she went [place] the beach [place]  Pt states yesterday she started having difficulty urinating again like she had to strain  Pt states she is having discomfort in her lower abdomen

## 2016-02-21 ENCOUNTER — Emergency Department (HOSPITAL_COMMUNITY): Payer: Medicaid Other

## 2016-02-21 ENCOUNTER — Encounter (HOSPITAL_COMMUNITY): Payer: Self-pay

## 2016-02-21 LAB — CBC WITH DIFFERENTIAL/PLATELET
BASOS ABS: 0 10*3/uL (ref 0.0–0.1)
Basophils Relative: 0 %
Eosinophils Absolute: 0.1 10*3/uL (ref 0.0–0.7)
Eosinophils Relative: 1 %
HCT: 36.3 % (ref 36.0–46.0)
Hemoglobin: 13 g/dL (ref 12.0–15.0)
LYMPHS ABS: 2.3 10*3/uL (ref 0.7–4.0)
Lymphocytes Relative: 37 %
MCH: 33.7 pg (ref 26.0–34.0)
MCHC: 35.8 g/dL (ref 30.0–36.0)
MCV: 94 fL (ref 78.0–100.0)
MONO ABS: 0.7 10*3/uL (ref 0.1–1.0)
Monocytes Relative: 12 %
Neutro Abs: 3.1 10*3/uL (ref 1.7–7.7)
Neutrophils Relative %: 50 %
PLATELETS: 99 10*3/uL — AB (ref 150–400)
RBC: 3.86 MIL/uL — AB (ref 3.87–5.11)
RDW: 15.1 % (ref 11.5–15.5)
WBC: 6.2 10*3/uL (ref 4.0–10.5)

## 2016-02-21 LAB — BASIC METABOLIC PANEL
ANION GAP: 6 (ref 5–15)
BUN: 14 mg/dL (ref 6–20)
CALCIUM: 8.6 mg/dL — AB (ref 8.9–10.3)
CO2: 23 mmol/L (ref 22–32)
CREATININE: 0.73 mg/dL (ref 0.44–1.00)
Chloride: 107 mmol/L (ref 101–111)
GLUCOSE: 83 mg/dL (ref 65–99)
Potassium: 3.9 mmol/L (ref 3.5–5.1)
Sodium: 136 mmol/L (ref 135–145)

## 2016-02-21 MED ORDER — IOPAMIDOL (ISOVUE-300) INJECTION 61%
100.0000 mL | Freq: Once | INTRAVENOUS | Status: AC | PRN
  Administered 2016-02-21: 100 mL via INTRAVENOUS

## 2016-02-21 NOTE — Discharge Instructions (Signed)
Your uterus appears enlarged on CT scan. The radiologist believes this may be due to accumulation of blood but he cannot exclude endometriosis or cancer. You will need to have a follow-up endometrial biopsy; please contact your primary care physician to arrange this.

## 2016-02-21 NOTE — ED Notes (Signed)
Patient transported to CT 

## 2016-02-22 ENCOUNTER — Emergency Department (HOSPITAL_COMMUNITY)
Admission: EM | Admit: 2016-02-22 | Discharge: 2016-02-23 | Disposition: A | Discharge status: Home / Self Care | Payer: Medicaid Other | Attending: Emergency Medicine | Admitting: Emergency Medicine

## 2016-02-22 ENCOUNTER — Encounter (HOSPITAL_COMMUNITY): Payer: Self-pay | Admitting: Emergency Medicine

## 2016-02-22 DIAGNOSIS — I10 Essential (primary) hypertension: Secondary | ICD-10-CM | POA: Diagnosis not present

## 2016-02-22 DIAGNOSIS — M1990 Unspecified osteoarthritis, unspecified site: Secondary | ICD-10-CM | POA: Diagnosis not present

## 2016-02-22 DIAGNOSIS — N939 Abnormal uterine and vaginal bleeding, unspecified: Secondary | ICD-10-CM | POA: Insufficient documentation

## 2016-02-22 DIAGNOSIS — E119 Type 2 diabetes mellitus without complications: Secondary | ICD-10-CM | POA: Diagnosis not present

## 2016-02-22 DIAGNOSIS — Z87891 Personal history of nicotine dependence: Secondary | ICD-10-CM | POA: Diagnosis not present

## 2016-02-22 DIAGNOSIS — Z79899 Other long term (current) drug therapy: Secondary | ICD-10-CM | POA: Insufficient documentation

## 2016-02-22 LAB — CBC WITH DIFFERENTIAL/PLATELET
Basophils Absolute: 0 10*3/uL (ref 0.0–0.1)
Basophils Relative: 0 %
EOS ABS: 0 10*3/uL (ref 0.0–0.7)
Eosinophils Relative: 0 %
HCT: 35.6 % — ABNORMAL LOW (ref 36.0–46.0)
Hemoglobin: 12.8 g/dL (ref 12.0–15.0)
LYMPHS ABS: 1.3 10*3/uL (ref 0.7–4.0)
Lymphocytes Relative: 15 %
MCH: 33.5 pg (ref 26.0–34.0)
MCHC: 36 g/dL (ref 30.0–36.0)
MCV: 93.2 fL (ref 78.0–100.0)
Monocytes Absolute: 1 10*3/uL (ref 0.1–1.0)
Monocytes Relative: 11 %
NEUTROS ABS: 6.4 10*3/uL (ref 1.7–7.7)
Neutrophils Relative %: 74 %
Platelets: 119 10*3/uL — ABNORMAL LOW (ref 150–400)
RBC: 3.82 MIL/uL — ABNORMAL LOW (ref 3.87–5.11)
RDW: 15.1 % (ref 11.5–15.5)
WBC: 8.7 10*3/uL (ref 4.0–10.5)

## 2016-02-22 LAB — BASIC METABOLIC PANEL
Anion gap: 8 (ref 5–15)
BUN: 13 mg/dL (ref 6–20)
CALCIUM: 8.5 mg/dL — AB (ref 8.9–10.3)
CHLORIDE: 105 mmol/L (ref 101–111)
CO2: 22 mmol/L (ref 22–32)
CREATININE: 0.68 mg/dL (ref 0.44–1.00)
GFR calc non Af Amer: >60 mL/min (ref >60)
GLUCOSE: 141 mg/dL — AB (ref 65–99)
Potassium: 4 mmol/L (ref 3.5–5.1)
SODIUM: 135 mmol/L (ref 135–145)

## 2016-02-22 LAB — PROTIME-INR
INR: 1.3 (ref 0.00–1.49)
Prothrombin Time: 16.4 seconds — ABNORMAL HIGH (ref 11.6–15.2)

## 2016-02-22 MED ORDER — ONDANSETRON 4 MG PO TBDP
4.0000 mg | ORAL_TABLET | Freq: Once | ORAL | Status: AC
  Administered 2016-02-22: 4 mg via ORAL
  Filled 2016-02-22: qty 1

## 2016-02-22 MED ORDER — HYDROCODONE-ACETAMINOPHEN 5-325 MG PO TABS
1.0000 | ORAL_TABLET | Freq: Once | ORAL | Status: AC
  Administered 2016-02-22: 1 via ORAL
  Filled 2016-02-22: qty 1

## 2016-02-22 NOTE — ED Notes (Addendum)
Patient presents for lower abdominal pain, nausea, and rectal bleeding (5 sanitary napkins per hour). Seen yesterday for abdominal pain, reports pain starting yesterday afternoon.

## 2016-02-22 NOTE — ED Notes (Signed)
PA at bedside.
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
## 2016-02-23 MED ORDER — ONDANSETRON 4 MG PO TBDP: 4.0000 mg | ORAL_TABLET | Freq: Three times a day (TID) | ORAL | Status: DC | PRN

## 2016-02-23 MED ORDER — HYDROCODONE-ACETAMINOPHEN 5-325 MG PO TABS: 2.0000 | ORAL_TABLET | ORAL | Status: DC | PRN

## 2016-02-23 NOTE — ED Notes (Signed)
Patient was alert, oriented and stable upon discharge. RN went over AVS and patient had no further questions.  

## 2016-02-23 NOTE — Discharge Instructions (Signed)
1. Medications: Take zofran as needed for nausea, take pain medication only as needed for severe pain - This can make you very drowsy - please do not drink alcohol, operate heavy machinery or drive on this medication, continue usual home medications 2. Treatment: rest, drink plenty of fluids 3. Follow Up: Please follow up with your primary doctor at scheduled appointment on Friday for discussion of your diagnoses and further evaluation after today's visit; Please return to the ER for new or worsening symptoms, any additional concerns.

## 2016-03-23 ENCOUNTER — Encounter: Payer: Self-pay | Admitting: *Deleted

## 2016-03-29 ENCOUNTER — Encounter (HOSPITAL_COMMUNITY): Payer: Self-pay | Admitting: Emergency Medicine

## 2016-03-29 ENCOUNTER — Ambulatory Visit (HOSPITAL_COMMUNITY)
Admission: EM | Admit: 2016-03-29 | Discharge: 2016-03-29 | Disposition: A | Discharge status: Home / Self Care | Payer: Medicaid Other | Attending: Family Medicine | Admitting: Family Medicine

## 2016-03-29 DIAGNOSIS — Z87891 Personal history of nicotine dependence: Secondary | ICD-10-CM | POA: Diagnosis not present

## 2016-03-29 DIAGNOSIS — M1990 Unspecified osteoarthritis, unspecified site: Secondary | ICD-10-CM | POA: Insufficient documentation

## 2016-03-29 DIAGNOSIS — K7460 Unspecified cirrhosis of liver: Secondary | ICD-10-CM | POA: Insufficient documentation

## 2016-03-29 DIAGNOSIS — Z8719 Personal history of other diseases of the digestive system: Secondary | ICD-10-CM | POA: Diagnosis not present

## 2016-03-29 DIAGNOSIS — IMO0001 Reserved for inherently not codable concepts without codable children: Secondary | ICD-10-CM

## 2016-03-29 DIAGNOSIS — L299 Pruritus, unspecified: Secondary | ICD-10-CM | POA: Diagnosis present

## 2016-03-29 DIAGNOSIS — Z79899 Other long term (current) drug therapy: Secondary | ICD-10-CM | POA: Diagnosis not present

## 2016-03-29 DIAGNOSIS — R030 Elevated blood-pressure reading, without diagnosis of hypertension: Secondary | ICD-10-CM

## 2016-03-29 DIAGNOSIS — Z833 Family history of diabetes mellitus: Secondary | ICD-10-CM | POA: Insufficient documentation

## 2016-03-29 DIAGNOSIS — I10 Essential (primary) hypertension: Secondary | ICD-10-CM | POA: Diagnosis not present

## 2016-03-29 DIAGNOSIS — Z8249 Family history of ischemic heart disease and other diseases of the circulatory system: Secondary | ICD-10-CM | POA: Insufficient documentation

## 2016-03-29 DIAGNOSIS — E119 Type 2 diabetes mellitus without complications: Secondary | ICD-10-CM | POA: Diagnosis not present

## 2016-03-29 LAB — COMPREHENSIVE METABOLIC PANEL
ALT: 82 U/L — ABNORMAL HIGH (ref 14–54)
AST: 144 U/L — ABNORMAL HIGH (ref 15–41)
Albumin: 2.7 g/dL — ABNORMAL LOW (ref 3.5–5.0)
Alkaline Phosphatase: 171 U/L — ABNORMAL HIGH (ref 38–126)
Anion gap: 7 (ref 5–15)
BUN: 10 mg/dL (ref 6–20)
CO2: 25 mmol/L (ref 22–32)
Calcium: 8.8 mg/dL — ABNORMAL LOW (ref 8.9–10.3)
Chloride: 105 mmol/L (ref 101–111)
Creatinine, Ser: 0.94 mg/dL (ref 0.44–1.00)
GFR calc Af Amer: >60 mL/min (ref >60)
GFR calc non Af Amer: >60 mL/min (ref >60)
Glucose, Bld: 73 mg/dL (ref 65–99)
Potassium: 4.2 mmol/L (ref 3.5–5.1)
Sodium: 137 mmol/L (ref 135–145)
Total Bilirubin: 2.1 mg/dL — ABNORMAL HIGH (ref 0.3–1.2)
Total Protein: 7.3 g/dL (ref 6.5–8.1)

## 2016-03-29 LAB — CBC WITH DIFFERENTIAL/PLATELET
Basophils Absolute: 0 10*3/uL (ref 0.0–0.1)
Basophils Relative: 0 %
Eosinophils Absolute: 0.1 10*3/uL (ref 0.0–0.7)
Eosinophils Relative: 2 %
HCT: 36.8 % (ref 36.0–46.0)
Hemoglobin: 12.7 g/dL (ref 12.0–15.0)
Lymphocytes Relative: 38 %
Lymphs Abs: 2 10*3/uL (ref 0.7–4.0)
MCH: 32.7 pg (ref 26.0–34.0)
MCHC: 34.5 g/dL (ref 30.0–36.0)
MCV: 94.8 fL (ref 78.0–100.0)
Monocytes Absolute: 0.7 10*3/uL (ref 0.1–1.0)
Monocytes Relative: 14 %
Neutro Abs: 2.5 10*3/uL (ref 1.7–7.7)
Neutrophils Relative %: 46 %
Platelets: 66 10*3/uL — ABNORMAL LOW (ref 150–400)
RBC: 3.88 MIL/uL (ref 3.87–5.11)
RDW: 14.7 % (ref 11.5–15.5)
WBC: 5.4 10*3/uL (ref 4.0–10.5)

## 2016-03-29 MED ORDER — HYDROXYZINE HCL 25 MG PO TABS: 25.0000 mg | ORAL_TABLET | Freq: Four times a day (QID) | ORAL | Status: DC | PRN

## 2016-03-29 MED ORDER — PREDNISONE 20 MG PO TABS: ORAL_TABLET | ORAL | Status: DC

## 2016-03-29 NOTE — Discharge Instructions (Signed)
°  Atarax (hydroxizine) is an antihistamine that can be taken to help with itching. This medication can cause drowsiness so do not drive or drink alcohol while taking.    Your itching may be due to the hydrocodone you are taking for your abdominal pain, or it may be due to your cirrhosis. The labs drawn today to check your liver function should result in 2 days.  Please follow up with your primary care provider to review today's labs.

## 2016-03-29 NOTE — ED Notes (Signed)
The patient presented to the [facility] with a complaint of itching all over for 2 weeks.

## 2016-03-29 NOTE — ED Provider Notes (Signed)
CSN: [id number]     Arrival date & time 03/29/16  1543 History   First MD Initiated Contact with Patient 03/29/16 1700     Chief Complaint  Patient presents with  . Pruritis   (Consider location/radiation/quality/duration/timing/severity/associated sxs/prior Treatment) HPI  Elizabeth Mclean is a 60 y.o. female presenting to UC with c/o diffuse itching w/o rash for about 2 weeks.  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  She notes she is still having upper abdominal pain and had a CT scan done on 02/21/16.  CT at that time was not concerning for cholecystitis but did show cirrhosis and portal hypertension.  Pt notes she is currently being referred to [facility] clinic for further evaluation of uterus findings.  Denies fever, chills, n/v/d.  She has taken benadryl with minimal relief of itching. Denies oral swelling or SOB. Denies use of new soaps or lotions.   Past Medical History  Diagnosis Date  . Allergy   . Hypertension   . Arthritis   . Cancer ([place])   . Diabetes mellitus without complication ([place])   . Hepatitis C    Past Surgical History  Procedure Laterality Date  . Thumb surgery    . Mouth surgery    . Breast surgery     Family History  Problem Relation Age of Onset  . Diabetes Mother   . Diabetes Father   . Hypertension Other    Social History  Substance Use Topics  . Smoking status: Former Research scientist (life sciences)  . Smokeless tobacco: None  . Alcohol Use: No   OB History    No data available     Review of Systems  Constitutional: Negative for fever and chills.  HENT: Negative for congestion, ear pain, sore throat, trouble swallowing and voice change.   Respiratory: Negative for cough and shortness of breath.   Cardiovascular: Negative for chest pain and palpitations.  Gastrointestinal: Positive for abdominal pain. Negative for nausea, vomiting and diarrhea.  Genitourinary: Positive for pelvic pain. Negative for dysuria, urgency,  frequency, hematuria, decreased urine volume, vaginal bleeding, vaginal discharge and vaginal pain.  Musculoskeletal: Negative for myalgias, back pain and arthralgias.  Skin: Negative for rash.    Allergies  Aspirin and Fexofenadine  Home Medications   Prior to Admission medications   Medication Sig Start Date End Date Taking? Authorizing Provider  ACCU-CHEK AVIVA PLUS test strip  07/22/14  Yes Historical Provider, MD  Ascorbic Acid (VITAMIN C PO) Take 1 tablet by mouth daily.   Yes Historical Provider, MD  benzonatate (TESSALON) 100 MG capsule Take 100 mg by mouth 3 (three) times daily as needed for cough.   Yes Historical Provider, MD  cetirizine (ZYRTEC) 10 MG tablet Take 10 mg by mouth daily.     Yes Historical Provider, MD  cyclobenzaprine (FLEXERIL) 5 MG tablet Take 1 tablet (5 mg total) by mouth 3 (three) times daily as needed for muscle spasms. 07/17/12  Yes Harden Mo, MD  diclofenac (VOLTAREN) 75 MG EC tablet Take 1 tablet (75 mg total) by mouth 2 (two) times daily. 07/17/12  Yes Harden Mo, MD  furosemide (LASIX) 20 MG tablet Take 1 tablet by mouth daily. 01/09/16  Yes Historical Provider, MD  NOVOLOG MIX 70/30 FLEXPEN (70-30) 100 UNIT/ML FlexPen Inject 20-24 Units into the skin 2 (two) times daily. Inject 20 units into the skin each morning and 24 units into the skin each evening. 12/06/15  Yes Historical Provider,  MD  ondansetron (ZOFRAN ODT) 4 MG disintegrating tablet Take 1 tablet (4 mg total) by mouth every 8 (eight) hours as needed for nausea or vomiting. 02/23/16  Yes Jaime Pilcher Ward, PA-C  RA VITAMIN D-3 2000 units CAPS Take 1 capsule by mouth daily. 01/09/16  Yes Historical Provider, MD  traMADol (ULTRAM) 50 MG tablet Take 1 tablet (50 mg total) by mouth every 8 (eight) hours as needed for pain. 11/27/12  Yes Theodis Blaze, MD  valsartan-hydrochlorothiazide (DIOVAN HCT) 160-12.5 MG per tablet Take 1 tablet by mouth daily. 11/18/12  Yes Reyne Dumas, MD  VESICARE 5 MG  tablet Take 5 mg by mouth daily. 02/08/16  Yes Historical Provider, MD  azithromycin (ZITHROMAX) 250 MG tablet Take 1 tablet (250 mg total) by mouth daily. Take first 2 tablets together, then 1 every day until finished. Patient not taking: Reported on 02/22/2016 11/15/14   Charlann Lange, PA-C  HYDROcodone-acetaminophen (NORCO/VICODIN) 5-325 MG tablet Take 2 tablets by mouth every 4 (four) hours as needed. 02/23/16   Ozella Almond Ward, PA-C  hydrOXYzine (ATARAX/VISTARIL) 25 MG tablet Take 1 tablet (25 mg total) by mouth every 6 (six) hours as needed for itching. 03/29/16   Noland Fordyce, PA-C  naproxen (NAPROSYN) 500 MG tablet Take 1 tablet (500 mg total) by mouth 2 (two) times daily. Patient not taking: Reported on 02/22/2016 10/19/15   Comer Locket, PA-C  predniSONE (DELTASONE) 20 MG tablet 3 tabs po day one, then 2 po daily x 4 days 03/29/16   Noland Fordyce, PA-C  tamoxifen (NOLVADEX) 20 MG tablet Take 1 tablet (20 mg total) by mouth daily. Patient not taking: Reported on 02/22/2016 04/15/14   Minette Headland, NP  Venlafaxine HCl 75 MG TB24 Take 1 tablet (75 mg total) by mouth daily. 01/26/13   Consuela Mimes, MD   Meds Ordered and Administered this Visit  Medications - No data to display  BP 164/102 mmHg  Pulse 91  Temp(Src) 98.1 F (36.7 C) (Oral)  Resp 20  SpO2 96% No data found.   Physical Exam  Constitutional: She is oriented to person, place, and time. She appears well-developed and well-nourished.  HENT:  Head: Normocephalic and atraumatic.  Mouth/Throat: Oropharynx is clear and moist.  Eyes: EOM are normal.  Neck: Normal range of motion.  Cardiovascular: Normal rate, regular rhythm and normal heart sounds.   Pulmonary/Chest: Effort normal and breath sounds normal. No respiratory distress. She has no wheezes. She has no rales.  Abdominal: Soft. Bowel sounds are normal. She exhibits no distension and no mass. There is tenderness. There is no rebound and no guarding.  Obese abdomen,  tenderness across upper abdomen w/o rebound or guarding.  Musculoskeletal: Normal range of motion.  Neurological: She is alert and oriented to person, place, and time.  Skin: Skin is warm and dry. No rash noted. No erythema.  No rashes noted.  Psychiatric: She has a normal mood and affect. Her behavior is normal.  Nursing note and vitals reviewed.   ED Course  Procedures (including critical care time)  Labs Review Labs Reviewed  CBC WITH DIFFERENTIAL/PLATELET  COMPREHENSIVE METABOLIC PANEL    Imaging Review No results found.    MDM   1. Itching   2. History of cirrhosis of liver   3. Elevated blood pressure     Pt c/o diffuse itching w/o rash.  Pt recently started on hydrocodone for abdominal pain, had CT last month for same, waiting on f/u appointment with her PCP and  [facility]    Mildly elevated BP. Hx of HTN.   No rash seen on exam. Question pruritis secondary to hydrocodone vs due to cirrhosis.   Labs pending: CBC and CMP Encouraged f/u with PCP for her cirrhosis.  Will try symptomatic treatment of itching. Rx: prednisone and atarax  Discussed symptoms that warrant emergent care in the ED. Patient verbalized understanding and agreement with treatment plan.    Noland Fordyce, PA-C 03/29/16 1724

## 2016-04-16 ENCOUNTER — Encounter: Payer: Self-pay | Admitting: Obstetrics & Gynecology

## 2016-04-16 ENCOUNTER — Ambulatory Visit (INDEPENDENT_AMBULATORY_CARE_PROVIDER_SITE_OTHER): Payer: Medicaid Other | Admitting: Obstetrics & Gynecology

## 2016-04-16 VITALS — BP 146/74 | HR 81 | Ht 67.0 in | Wt 286.0 lb

## 2016-04-16 DIAGNOSIS — F341 Dysthymic disorder: Secondary | ICD-10-CM | POA: Diagnosis not present

## 2016-04-16 DIAGNOSIS — I10 Essential (primary) hypertension: Secondary | ICD-10-CM | POA: Diagnosis not present

## 2016-04-16 DIAGNOSIS — D0580 Other specified type of carcinoma in situ of unspecified breast: Secondary | ICD-10-CM | POA: Diagnosis present

## 2016-04-16 DIAGNOSIS — N950 Postmenopausal bleeding: Secondary | ICD-10-CM

## 2016-04-16 DIAGNOSIS — N951 Menopausal and female climacteric states: Secondary | ICD-10-CM | POA: Diagnosis not present

## 2016-04-16 DIAGNOSIS — R102 Pelvic and perineal pain: Secondary | ICD-10-CM

## 2016-04-16 MED ORDER — MISOPROSTOL 200 MCG PO TABS: 400.0000 ug | ORAL_TABLET | Freq: Once | ORAL | 0 refills | Status: DC
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
## 2016-04-17 ENCOUNTER — Encounter: Payer: Self-pay | Admitting: Clinical

## 2016-04-17 ENCOUNTER — Other Ambulatory Visit (HOSPITAL_COMMUNITY)
Admission: RE | Admit: 2016-04-17 | Discharge: 2016-04-17 | Disposition: A | Discharge status: Home / Self Care | Payer: Medicaid Other | Source: Ambulatory Visit | Attending: Obstetrics & Gynecology | Admitting: Obstetrics & Gynecology

## 2016-04-17 ENCOUNTER — Telehealth: Payer: Self-pay | Admitting: Clinical

## 2016-04-17 DIAGNOSIS — Z01419 Encounter for gynecological examination (general) (routine) without abnormal findings: Secondary | ICD-10-CM | POA: Insufficient documentation

## 2016-04-17 DIAGNOSIS — Z1151 Encounter for screening for human papillomavirus (HPV): Secondary | ICD-10-CM | POA: Diagnosis not present

## 2016-04-17 NOTE — Telephone Encounter (Addendum)
Pt introduction to [facility] services at [facility] [place] [facility]; pt attributes symptoms of depression and anxiety to chronic pain. Ms. Elizabeth Mclean copes best with pain by doing water aerobics; says if she needs to talk further, she will make an appointment, or ask at the front desk to speak to [place]. Ms. Elizabeth Mclean says she appreciates someone calling to check on her, and that she is looking forward to spending time with grandchildren in the upcoming week.

## 2016-04-18 ENCOUNTER — Telehealth: Payer: Self-pay | Admitting: *Deleted

## 2016-04-18 ENCOUNTER — Encounter (HOSPITAL_COMMUNITY): Payer: Self-pay | Admitting: *Deleted

## 2016-04-18 LAB — CYTOLOGY - PAP

## 2016-04-18 NOTE — Telephone Encounter (Signed)
Called pt to inform her of prescription sent to her pharmacy for medication (Cytotec) to be taken prior to surgery. There was no answer and unable to elave a message. Her surgery has been scheduled for 8/29 @ 1130. The medication needs to be taken 6-8 hrs prior to surgery.

## 2016-04-20 NOTE — Telephone Encounter (Signed)
Patient did pick up her medication and she will take it before having her procedure.

## 2016-05-03 ENCOUNTER — Encounter (HOSPITAL_COMMUNITY): Payer: Self-pay

## 2016-05-03 ENCOUNTER — Other Ambulatory Visit: Payer: Self-pay

## 2016-05-03 ENCOUNTER — Encounter (HOSPITAL_COMMUNITY)
Admission: RE | Admit: 2016-05-03 | Discharge: 2016-05-03 | Disposition: A | Discharge status: Home / Self Care | Payer: Medicaid Other | Source: Ambulatory Visit | Attending: Obstetrics & Gynecology | Admitting: Obstetrics & Gynecology

## 2016-05-03 DIAGNOSIS — Z01810 Encounter for preprocedural cardiovascular examination: Secondary | ICD-10-CM | POA: Insufficient documentation

## 2016-05-03 DIAGNOSIS — Z01812 Encounter for preprocedural laboratory examination: Secondary | ICD-10-CM | POA: Insufficient documentation

## 2016-05-03 HISTORY — DX: Depression, unspecified: F32.A

## 2016-05-03 HISTORY — DX: Cramp and spasm: R25.2

## 2016-05-03 HISTORY — DX: Other enthesopathy of unspecified foot and ankle: M77.50

## 2016-05-03 HISTORY — DX: Other specified soft tissue disorders: M79.89

## 2016-05-03 HISTORY — DX: Insomnia, unspecified: G47.00

## 2016-05-03 HISTORY — DX: Major depressive disorder, single episode, unspecified: F32.9

## 2016-05-03 HISTORY — DX: Gastro-esophageal reflux disease without esophagitis: K21.9

## 2016-05-03 LAB — CBC
HEMATOCRIT: 34.4 % — AB (ref 36.0–46.0)
Hemoglobin: 12.4 g/dL (ref 12.0–15.0)
MCH: 33.6 pg (ref 26.0–34.0)
MCHC: 36 g/dL (ref 30.0–36.0)
MCV: 93.2 fL (ref 78.0–100.0)
Platelets: 107 10*3/uL — ABNORMAL LOW (ref 150–400)
RBC: 3.69 MIL/uL — AB (ref 3.87–5.11)
RDW: 15.3 % (ref 11.5–15.5)
WBC: 4.7 10*3/uL (ref 4.0–10.5)

## 2016-05-03 LAB — BASIC METABOLIC PANEL
ANION GAP: 5 (ref 5–15)
BUN: 11 mg/dL (ref 6–20)
CHLORIDE: 107 mmol/L (ref 101–111)
CO2: 26 mmol/L (ref 22–32)
Calcium: 8.7 mg/dL — ABNORMAL LOW (ref 8.9–10.3)
Creatinine, Ser: 0.68 mg/dL (ref 0.44–1.00)
GFR calc Af Amer: >60 mL/min (ref >60)
GFR calc non Af Amer: >60 mL/min (ref >60)
GLUCOSE: 132 mg/dL — AB (ref 65–99)
POTASSIUM: 3.8 mmol/L (ref 3.5–5.1)
Sodium: 138 mmol/L (ref 135–145)

## 2016-05-03 NOTE — Pre-Procedure Instructions (Signed)
Spoke with Dr. Ola Spurr about Ms. Elizabeth Mclean's low platelet levels, will repeat CBC day of surgery.  I also sent Dr. Ihor Dow an email via [facility] in reference to platelet values and spoke with [place] at Dr. Vladimir Creeks office.

## 2016-05-03 NOTE — Patient Instructions (Addendum)
Your procedure is scheduled on:  Tuesday, May 15, 2016  Enter through the Main Entrance of [facility] at:  10:15 AM  Pick up the phone at the desk and dial [phone].  Call this number if you have problems the morning of surgery: [phone].  Remember: Do NOT eat food or drink after: Midnight Monday, May 14, 2016  Take these medicines the morning of surgery with a SIP OF WATER:  Valsartan  Take 1/2 (half) bedtime insulin dose the evening before surgery  Stop Vitamin C at this time  Do NOT wear jewelry (body piercing), metal hair clips/bobby pins, make-up, or nail polish. Do NOT wear lotions, powders, or perfumes.  You may wear deodorant. Do NOT shave for 48 hours prior to surgery. Do NOT bring valuables to the hospital. Contacts, dentures, or bridgework may not be worn into surgery.  Have a responsible adult drive you home and stay with you for 24 hours after your procedure

## 2016-05-15 ENCOUNTER — Ambulatory Visit (HOSPITAL_COMMUNITY)
Admission: RE | Admit: 2016-05-15 | Discharge: 2016-05-15 | Disposition: A | Discharge status: Home / Self Care | Payer: Medicaid Other | Source: Ambulatory Visit | Attending: Obstetrics & Gynecology | Admitting: Obstetrics & Gynecology

## 2016-05-15 ENCOUNTER — Encounter (HOSPITAL_COMMUNITY): Payer: Self-pay

## 2016-05-15 ENCOUNTER — Ambulatory Visit (HOSPITAL_COMMUNITY): Payer: Medicaid Other | Admitting: Anesthesiology

## 2016-05-15 ENCOUNTER — Encounter (HOSPITAL_COMMUNITY)
Admission: RE | Disposition: A | Discharge status: Home / Self Care | Payer: Self-pay | Source: Ambulatory Visit | Attending: Obstetrics & Gynecology

## 2016-05-15 DIAGNOSIS — N924 Excessive bleeding in the premenopausal period: Secondary | ICD-10-CM

## 2016-05-15 DIAGNOSIS — K219 Gastro-esophageal reflux disease without esophagitis: Secondary | ICD-10-CM | POA: Insufficient documentation

## 2016-05-15 DIAGNOSIS — N950 Postmenopausal bleeding: Secondary | ICD-10-CM | POA: Diagnosis not present

## 2016-05-15 DIAGNOSIS — E119 Type 2 diabetes mellitus without complications: Secondary | ICD-10-CM | POA: Diagnosis not present

## 2016-05-15 DIAGNOSIS — R102 Pelvic and perineal pain: Secondary | ICD-10-CM | POA: Insufficient documentation

## 2016-05-15 DIAGNOSIS — Z853 Personal history of malignant neoplasm of breast: Secondary | ICD-10-CM | POA: Insufficient documentation

## 2016-05-15 DIAGNOSIS — I10 Essential (primary) hypertension: Secondary | ICD-10-CM | POA: Diagnosis not present

## 2016-05-15 DIAGNOSIS — Z87891 Personal history of nicotine dependence: Secondary | ICD-10-CM | POA: Insufficient documentation

## 2016-05-15 HISTORY — PX: HYSTEROSCOPY W/D&C: SHX1775

## 2016-05-15 LAB — CBC
HEMATOCRIT: 33.7 % — AB (ref 36.0–46.0)
Hemoglobin: 12.2 g/dL (ref 12.0–15.0)
MCH: 33.4 pg (ref 26.0–34.0)
MCHC: 36.2 g/dL — AB (ref 30.0–36.0)
MCV: 92.3 fL (ref 78.0–100.0)
PLATELETS: 102 10*3/uL — AB (ref 150–400)
RBC: 3.65 MIL/uL — ABNORMAL LOW (ref 3.87–5.11)
RDW: 15.3 % (ref 11.5–15.5)
WBC: 5.1 10*3/uL (ref 4.0–10.5)

## 2016-05-15 LAB — TYPE AND SCREEN
ABO/RH(D): O POS
ANTIBODY SCREEN: NEGATIVE

## 2016-05-15 LAB — GLUCOSE, CAPILLARY
Glucose-Capillary: 114 mg/dL — ABNORMAL HIGH (ref 65–99)
Glucose-Capillary: 85 mg/dL (ref 65–99)

## 2016-05-15 LAB — ABO/RH: ABO/RH(D): O POS

## 2016-05-15 SURGERY — DILATATION AND CURETTAGE /HYSTEROSCOPY
Anesthesia: General | Site: Vagina

## 2016-05-15 MED ORDER — PROMETHAZINE HCL 25 MG/ML IJ SOLN: 6.2500 mg | INTRAMUSCULAR | Status: DC | PRN

## 2016-05-15 MED ORDER — FENTANYL CITRATE (PF) 100 MCG/2ML IJ SOLN: 25.0000 ug | INTRAMUSCULAR | Status: DC | PRN

## 2016-05-15 MED ORDER — MIDAZOLAM HCL 2 MG/2ML IJ SOLN
INTRAMUSCULAR | Status: DC | PRN
  Administered 2016-05-15: 2 mg via INTRAVENOUS

## 2016-05-15 MED ORDER — KETOROLAC TROMETHAMINE 30 MG/ML IJ SOLN
INTRAMUSCULAR | Status: AC
  Filled 2016-05-15: qty 1

## 2016-05-15 MED ORDER — LACTATED RINGERS IV SOLN
INTRAVENOUS | Status: DC
  Administered 2016-05-15: 10:00:00 via INTRAVENOUS

## 2016-05-15 MED ORDER — BUPIVACAINE HCL (PF) 0.5 % IJ SOLN
INTRAMUSCULAR | Status: AC
  Filled 2016-05-15: qty 30

## 2016-05-15 MED ORDER — LIDOCAINE HCL (CARDIAC) 20 MG/ML IV SOLN
INTRAVENOUS | Status: DC | PRN
  Administered 2016-05-15: 100 mg via INTRAVENOUS

## 2016-05-15 MED ORDER — MIDAZOLAM HCL 2 MG/2ML IJ SOLN
INTRAMUSCULAR | Status: AC
  Filled 2016-05-15: qty 2

## 2016-05-15 MED ORDER — FENTANYL CITRATE (PF) 100 MCG/2ML IJ SOLN
INTRAMUSCULAR | Status: DC | PRN
  Administered 2016-05-15 (×2): 50 ug via INTRAVENOUS

## 2016-05-15 MED ORDER — ONDANSETRON HCL 4 MG/2ML IJ SOLN
INTRAMUSCULAR | Status: AC
  Filled 2016-05-15: qty 2

## 2016-05-15 MED ORDER — LIDOCAINE HCL (CARDIAC) 20 MG/ML IV SOLN
INTRAVENOUS | Status: AC
  Filled 2016-05-15: qty 5

## 2016-05-15 MED ORDER — PROPOFOL 10 MG/ML IV BOLUS
INTRAVENOUS | Status: AC
  Filled 2016-05-15: qty 40

## 2016-05-15 MED ORDER — LACTATED RINGERS IV SOLN: INTRAVENOUS | Status: DC

## 2016-05-15 MED ORDER — BUPIVACAINE HCL (PF) 0.5 % IJ SOLN
INTRAMUSCULAR | Status: DC | PRN
  Administered 2016-05-15 (×2): 10 mL

## 2016-05-15 MED ORDER — PHENYLEPHRINE HCL 10 MG/ML IJ SOLN
INTRAMUSCULAR | Status: DC | PRN
  Administered 2016-05-15 (×3): 80 ug via INTRAVENOUS
  Administered 2016-05-15: 40 ug via INTRAVENOUS

## 2016-05-15 MED ORDER — PROPOFOL 10 MG/ML IV BOLUS
INTRAVENOUS | Status: DC | PRN
  Administered 2016-05-15: 200 mg via INTRAVENOUS

## 2016-05-15 MED ORDER — SCOPOLAMINE 1 MG/3DAYS TD PT72
1.0000 | MEDICATED_PATCH | Freq: Once | TRANSDERMAL | Status: DC
  Administered 2016-05-15: 1.5 mg via TRANSDERMAL

## 2016-05-15 MED ORDER — FENTANYL CITRATE (PF) 100 MCG/2ML IJ SOLN
INTRAMUSCULAR | Status: AC
Start: 2016-05-15 — End: 2016-05-15
  Filled 2016-05-15: qty 2

## 2016-05-15 MED ORDER — ONDANSETRON HCL 4 MG/2ML IJ SOLN
INTRAMUSCULAR | Status: DC | PRN
  Administered 2016-05-15: 4 mg via INTRAVENOUS

## 2016-05-15 MED ORDER — SODIUM CHLORIDE 0.9 % IR SOLN
Status: DC | PRN
  Administered 2016-05-15: 3000 mL

## 2016-05-15 MED ORDER — SCOPOLAMINE 1 MG/3DAYS TD PT72
MEDICATED_PATCH | TRANSDERMAL | Status: AC
  Filled 2016-05-15: qty 1

## 2016-05-15 SURGICAL SUPPLY — 14 items
CATH ROBINSON RED A/P 16FR (CATHETERS) ×3 IMPLANT
CLOTH BEACON ORANGE TIMEOUT ST (SAFETY) ×3 IMPLANT
CONTAINER PREFILL 10% NBF 60ML (FORM) IMPLANT
GLOVE BIO SURGEON STRL SZ7 (GLOVE) ×3 IMPLANT
GLOVE BIOGEL PI IND STRL 7.0 (GLOVE) ×2 IMPLANT
GLOVE BIOGEL PI INDICATOR 7.0 (GLOVE) ×4
GOWN STRL REUS W/TWL LRG LVL3 (GOWN DISPOSABLE) ×6 IMPLANT
GOWN STRL REUS W/TWL XL LVL3 (GOWN DISPOSABLE) ×3 IMPLANT
PACK VAGINAL MINOR WOMEN LF (CUSTOM PROCEDURE TRAY) ×3 IMPLANT
PAD OB MATERNITY 4.3X12.25 (PERSONAL CARE ITEMS) ×3 IMPLANT
TOWEL OR 17X24 6PK STRL BLUE (TOWEL DISPOSABLE) ×6 IMPLANT
TUBING AQUILEX INFLOW (TUBING) ×3 IMPLANT
TUBING AQUILEX OUTFLOW (TUBING) ×3 IMPLANT
WATER STERILE IRR 1000ML POUR (IV SOLUTION) ×3 IMPLANT

## 2016-05-15 NOTE — Brief Op Note (Signed)
05/15/2016  11:36 AM  PATIENT:  Elizabeth Mclean  60 y.o. female  PRE-OPERATIVE DIAGNOSIS:   PMPB and pelvic pain. Possible hematocolpos.  Need to rule out carcinoma  POST-OPERATIVE DIAGNOSIS:   PMPB and pelvic pain. Possible hematocolpos.  Need to rule out  PROCEDURE:  Procedure(s): DILATATION AND CURETTAGE /HYSTEROSCOPY (N/A)  SURGEON:  Surgeon(s) and Role:    * Lavonia Drafts, MD - Primary  ANESTHESIA:   general and paracervical block  EBL:  Total I/O In: 800 [I.V.:800] Out: 80 [Urine:75; Blood:5]  BLOOD ADMINISTERED:none  DRAINS: none   LOCAL MEDICATIONS USED:  MARCAINE     SPECIMEN:  Source of Specimen:  endocervical currettings  DISPOSITION OF SPECIMEN:  PATHOLOGY  COUNTS:  YES  TOURNIQUET:  * No tourniquets in log *  DICTATION: .Note written in EPIC  PLAN OF CARE: Discharge to home after PACU  PATIENT DISPOSITION:  PACU - hemodynamically stable.   Delay start of Pharmacological VTE agent (>24hrs) due to surgical blood loss or risk of bleeding: not applicable  Celeste Candelas L. Harraway-Smith, M.D., [facility]

## 2016-05-15 NOTE — H&P (Signed)
Preoperative History and Physical  [place] is a 60 y.o. No obstetric history on file. here for surgical management of post menopausal bleeding.   Proposed surgery: hysteroscopy with dilation and currettage   Past Medical History:  Diagnosis Date  . Allergy   . Arthritis    hips, shoulders, face  . Bilateral swelling of feet   . Bone spur of foot   . Cancer [facility]) 2011   Left Breast cancer  . Chronic pain   . Depression   . Diabetes mellitus without complication ([place])   . GERD (gastroesophageal reflux disease)   . Hepatitis C   . Hypertension   . Insomnia   . Leg cramps    Past Surgical History:  Procedure Laterality Date  . BREAST SURGERY    . COLONOSCOPY    . epidural steroid injection    . MOUTH SURGERY    . THUMB SURGERY     OB History    No data available     Patient denies any cervical dysplasia or STIs. Prescriptions Prior to Admission  Medication Sig Dispense Refill Last Dose  . Ascorbic Acid (VITAMIN C PO) Take 1 tablet by mouth daily.   Past Month at Unknown time  . benzonatate (TESSALON) 100 MG capsule Take 100 mg by mouth 3 (three) times daily as needed for cough.   Past Week at Unknown time  . cetirizine (ZYRTEC) 10 MG tablet Take 10 mg by mouth daily.     Past Week at Unknown time  . cyclobenzaprine (FLEXERIL) 5 MG tablet Take 1 tablet (5 mg total) by mouth 3 (three) times daily as needed for muscle spasms. 30 tablet 3 Past Week at Unknown time  . furosemide (LASIX) 20 MG tablet Take 1 tablet by mouth daily.  0 05/14/2016 at Unknown time  . NOVOLOG MIX 70/30 FLEXPEN (70-30) 100 UNIT/ML FlexPen Inject 20-24 Units into the skin 2 (two) times daily. Inject 20 units into the skin each morning and 24 units into the skin each evening.  0 05/14/2016 at Unknown time  . ondansetron (ZOFRAN ODT) 4 MG disintegrating tablet Take 1 tablet (4 mg total) by mouth every 8 (eight) hours as needed for nausea or vomiting. 20 tablet 0 Past Month at Unknown time  .  oxyCODONE-acetaminophen (PERCOCET/ROXICET) 5-325 MG tablet Take 1 tablet by mouth every 6 (six) hours as needed for severe pain.   0 Past Week at Unknown time  . RA VITAMIN D-3 2000 units CAPS Take 1 capsule by mouth daily.  0 Past Week at Unknown time  . traMADol (ULTRAM) 50 MG tablet Take 1 tablet (50 mg total) by mouth every 8 (eight) hours as needed for pain. 45 tablet 1 Past Week at Unknown time  . valsartan-hydrochlorothiazide (DIOVAN HCT) 160-12.5 MG per tablet Take 1 tablet by mouth daily. 30 tablet 2 05/15/2016 at 0800  . VESICARE 5 MG tablet Take 5 mg by mouth daily.  0 05/14/2016 at Unknown time  . hydrOXYzine (ATARAX/VISTARIL) 25 MG tablet Take 1 tablet (25 mg total) by mouth every 6 (six) hours as needed for itching. (Patient not taking: Reported on 05/01/2016) 12 tablet 0 Not Taking at Unknown time  . misoprostol (CYTOTEC) 200 MCG tablet Take 2 tablets (400 mcg total) by mouth once. Take 6-8 hours prior to procedure. 2 tablet 0   . Venlafaxine HCl 75 MG TB24 Take 1 tablet (75 mg total) by mouth daily. (Patient not taking: Reported on 05/01/2016) 90 each 6 Not Taking  at Unknown time    Allergies  Allergen Reactions  . Aspirin Other (See Comments)    Stomach aches   . Fexofenadine     REACTION: Leg and back pain  . Hydrocodone Itching   Social History:   reports that she has quit smoking. She has never used smokeless tobacco. She reports that she does not drink alcohol or use drugs. Family History  Problem Relation Age of Onset  . Diabetes Mother   . Diabetes Father   . Hypertension Other     Review of Systems: Noncontributory  PHYSICAL EXAM: Blood pressure 135/62, pulse 99, temperature 98.4 F (36.9 C), temperature source Oral, resp. rate (!) 22, last menstrual period 02/22/2016, SpO2 97 %. General appearance - alert, well appearing, and in no distress Chest - clear to auscultation, no wheezes, rales or rhonchi, symmetric air entry Heart - normal rate and regular  rhythm Abdomen - soft, nontender, nondistended, no masses or organomegaly Pelvic - examination not indicated Extremities - peripheral pulses normal, no pedal edema, no clubbing or cyanosis  Labs: Results for orders placed or performed during the hospital encounter of 05/15/16 (from the past 336 hour(s))  Glucose, capillary   Collection Time: 05/15/16  8:49 AM  Result Value Ref Range   Glucose-Capillary 114 (H) 65 - 99 mg/dL  CBC   Collection Time: 05/15/16  8:50 AM  Result Value Ref Range   WBC 5.1 4.0 - 10.5 K/uL   RBC 3.65 (L) 3.87 - 5.11 MIL/uL   Hemoglobin 12.2 12.0 - 15.0 g/dL   HCT 33.7 (L) 36.0 - 46.0 %   MCV 92.3 78.0 - 100.0 fL   MCH 33.4 26.0 - 34.0 pg   MCHC 36.2 (H) 30.0 - 36.0 g/dL   RDW 15.3 11.5 - 15.5 %   Platelets 102 (L) 150 - 400 K/uL  Type and screen [place]   Collection Time: 05/15/16  8:50 AM  Result Value Ref Range   ABO/RH(D) O POS    Antibody Screen PENDING    Sample Expiration 05/18/2016   Results for orders placed or performed during the hospital encounter of 05/03/16 (from the past 336 hour(s))  CBC   Collection Time: 05/03/16 12:00 PM  Result Value Ref Range   WBC 4.7 4.0 - 10.5 K/uL   RBC 3.69 (L) 3.87 - 5.11 MIL/uL   Hemoglobin 12.4 12.0 - 15.0 g/dL   HCT 34.4 (L) 36.0 - 46.0 %   MCV 93.2 78.0 - 100.0 fL   MCH 33.6 26.0 - 34.0 pg   MCHC 36.0 30.0 - 36.0 g/dL   RDW 15.3 11.5 - 15.5 %   Platelets 107 (L) 150 - 400 K/uL  Basic metabolic panel   Collection Time: 05/03/16 12:00 PM  Result Value Ref Range   Sodium 138 135 - 145 mmol/L   Potassium 3.8 3.5 - 5.1 mmol/L   Chloride 107 101 - 111 mmol/L   CO2 26 22 - 32 mmol/L   Glucose, Bld 132 (H) 65 - 99 mg/dL   BUN 11 6 - 20 mg/dL   Creatinine, Ser 0.68 0.44 - 1.00 mg/dL   Calcium 8.7 (L) 8.9 - 10.3 mg/dL   GFR calc non Af Amer >60 >60 mL/min   GFR calc Af Amer >60 >60 mL/min   Anion gap 5 5 - 15   CBC Latest Ref Rng & Units 05/15/2016 05/03/2016 03/29/2016  WBC 4.0  - 10.5 K/uL 5.1 4.7 5.4  Hemoglobin 12.0 - 15.0 g/dL  12.2 12.4 12.7  Hematocrit 36.0 - 46.0 % 33.7(L) 34.4(L) 36.8  Platelets 150 - 400 K/uL 102(L) 107(L) 66(L)    Imaging Studies: No results found.  Assessment: Patient Active Problem List   Diagnosis Date Noted  . 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 HYPERTENSION, BENIGN ESSENTIAL, CONTROLLED 05/21/2007  . COCAINE ABUSE 05/15/2007  . ALLERGIC RHINITIS 05/15/2007  . POSTMENOPAUSAL STATUS 05/15/2007  . [place] DISEASE, CERVICAL SPINE 05/15/2007    Plan: Patient will undergo surgical management with hysteroscopy with dilation and currettage.   The risks of surgery were discussed in detail with the patient including but not limited to: bleeding which may require transfusion or reoperation; infection which may require antibiotics; injury to surrounding organs which may involve bowel, bladder, ureters ; need for additional procedures including laparoscopy or laparotomy; thromboembolic phenomenon, surgical site problems and other postoperative/anesthesia complications. Likelihood of success in alleviating the patient's condition was discussed. Routine postoperative instructions will be reviewed with the patient and her family in detail after surgery.  The patient concurred with the proposed plan, giving informed written consent for the surgery.  Patient has been NPO since last night she will remain NPO for procedure.  Anesthesia and OR aware.  Preoperative prophylactic antibiotics and SCDs ordered on call to the OR.  To OR when ready.  Nayah Lukens L. Harraway-Smith, M.D., [facility] 05/15/2016 10:03 AM

## 2016-05-15 NOTE — Transfer of Care (Signed)
Immediate Anesthesia Transfer of Care Note  Patient: Elizabeth Mclean  Procedure(s) Performed: Procedure(s): DILATATION AND CURETTAGE /HYSTEROSCOPY (N/A)  Patient Location: PACU  Anesthesia Type:General  Level of Consciousness: awake, alert  and oriented  Airway & Oxygen Therapy: Patient Spontanous Breathing and Patient connected to nasal cannula oxygen  Post-op Assessment: Report given to RN and Post -op Vital signs reviewed and stable  Post vital signs: Reviewed and stable  Last Vitals:  Vitals:   05/15/16 0859 05/15/16 1106  BP: 135/62   Pulse: 99   Resp: (!) 22   Temp: 36.9 C (P) 37 C    Last Pain:  Vitals:   05/15/16 0859  TempSrc: Oral         Complications: No apparent anesthesia complications

## 2016-05-15 NOTE — Anesthesia Preprocedure Evaluation (Signed)
Anesthesia Evaluation  Patient identified by MRN, date of birth, ID band  Reviewed: Allergy & Precautions, NPO status , Patient's Chart, lab work & pertinent test results  Airway Mallampati: II  TM Distance: >3 FB Neck ROM: Full    Dental  (+) Dental Advisory Given,    Pulmonary former smoker,    Pulmonary exam normal        Cardiovascular hypertension, Pt. on medications Normal cardiovascular exam     Neuro/Psych PSYCHIATRIC DISORDERS Depression    GI/Hepatic GERD  Controlled,(+) Hepatitis -, C  Endo/Other  diabetes, Type 2  Renal/GU      Musculoskeletal  (+) Arthritis ,   Abdominal   Peds  Hematology  (+) Blood dyscrasia (thrombocytopenia), anemia ,   Anesthesia Other Findings Breast cancer s/p radiation/resection  Reproductive/Obstetrics                             Anesthesia Physical Anesthesia Plan  ASA: III  Anesthesia Plan: General   Post-op Pain Management:    Induction: Intravenous  Airway Management Planned: LMA  Additional Equipment:   Intra-op Plan:   Post-operative Plan: Extubation in OR  Informed Consent: I have reviewed the patients History and Physical, chart, labs and discussed the procedure including the risks, benefits and alternatives for the proposed anesthesia with the patient or authorized representative who has indicated his/her understanding and acceptance.   Dental advisory given  Plan Discussed with: CRNA and Surgeon  Anesthesia Plan Comments:         Anesthesia Quick Evaluation

## 2016-05-15 NOTE — Anesthesia Procedure Notes (Signed)
Procedure Name: LMA Insertion Date/Time: 05/15/2016 10:31 AM Performed by: Jonna Munro Pre-anesthesia Checklist: Patient identified, Emergency Drugs available, Suction available, Patient being monitored and Timeout performed Patient Re-evaluated:Patient Re-evaluated prior to inductionOxygen Delivery Method: Circle system utilized Preoxygenation: Pre-oxygenation with 100% oxygen Intubation Type: IV induction LMA: LMA inserted LMA Size: 4.0 Number of attempts: 1 Airway Equipment and Method: Patient positioned with wedge pillow Placement Confirmation: positive ETCO2 and breath sounds checked- equal and bilateral Tube secured with: Tape Dental Injury: Teeth and Oropharynx as per pre-operative assessment

## 2016-05-15 NOTE — Op Note (Signed)
05/15/2016  11:36 AM  PATIENT:  Elizabeth Mclean  60 y.o. female  PRE-OPERATIVE DIAGNOSIS:   PMPB and pelvic pain. Possible hematocolpos.  Need to rule out carcinoma  POST-OPERATIVE DIAGNOSIS:   PMPB and pelvic pain. Possible hematocolpos.  Need to rule out  PROCEDURE:  Procedure(s): DILATATION AND CURETTAGE /HYSTEROSCOPY (N/A)  SURGEON:  Surgeon(s) and Role:    * Lavonia Drafts, MD - Primary  ANESTHESIA:   general and paracervical block  EBL:  Total I/O In: 800 [I.V.:800] Out: 80 [Urine:75; Blood:5]  BLOOD ADMINISTERED:none  DRAINS: none   LOCAL MEDICATIONS USED:  MARCAINE     SPECIMEN:  Source of Specimen:  endocervical currettings  DISPOSITION OF SPECIMEN:  PATHOLOGY  COUNTS:  YES  TOURNIQUET:  * No tourniquets in log *  DICTATION: .Note written in EPIC  PLAN OF CARE: Discharge to home after PACU  PATIENT DISPOSITION:  PACU - hemodynamically stable.   Delay start of Pharmacological VTE agent (>24hrs) due to surgical blood loss or risk of bleeding: not applicable  INDICATIONS: 60 y.o. Postmenopausal woeman here for scheduled surgery for bleeding.   Risks of surgery were discussed with the patient including but not limited to: bleeding which may require transfusion; infection which may require antibiotics; injury to uterus or surrounding organs; intrauterine scarring which may impair future fertility; need for additional procedures including laparotomy or laparoscopy; and other postoperative/anesthesia complications. Written informed consent was obtained.    FINDINGS:  A 8 week size uterus.  Diffuse proliferative endometrium.  Normal ostia bilaterally.  PROCEDURE DETAILS:  The patient was taken to the operating room where general anesthesia was administered with LMA and was found to be adequate.  After an adequate timeout was performed, she was placed in the dorsal lithotomy position and examined; then prepped and draped in the sterile manner.   Her bladder  was catheterized for an unmeasured amount of clear, yellow urine. A speculum was then placed in the patient's vagina and a single tooth tenaculum was applied to the anterior lip of the cervix.   A paracervical block was performed using 20cc of .5% Marcaine.The cervix was sounded to 8cm and dilated manually with metal dilators to accommodate the 5 mm diagnostic hysteroscope.  Once the cervix was dilated, the hysteroscope was inserted under direct visualization using 1.5% glycine as a suspension medium.  The uterine cavity was carefully examined, both ostia were recognized, and diffusely proliferative endometrium was noted.   After further careful visualization of the uterine cavity, the hysteroscope was removed under direct visualization.  A sharp curettage was then performed to obtain a moderate amount of endometrial curettings.  The hysteroscope was reinserted and the cavity was found to be intact with no lesions or masses appreciated. The tenaculum was removed from the anterior lip of the cervix and the vaginal speculum was removed after noting good hemostasis.  The patient tolerated the procedure well and was taken to the recovery area awake, extubated and in stable condition.  The patient will be discharged to home as per PACU criteria.  Routine postoperative instructions given. She will follow up in the clinic in 4 weeks for postoperative evaluation.  Adonnis Salceda L. Harraway-Smith, M.D., Cherlynn June

## 2016-05-15 NOTE — Discharge Instructions (Signed)
Hysteroscopy, Care After Refer to this sheet in the next few weeks. These instructions provide you with information on caring for yourself after your procedure. Your health care provider may also give you more specific instructions. Your treatment has been planned according to current medical practices, but problems sometimes occur. Call your health care provider if you have any problems or questions after your procedure.  WHAT TO EXPECT AFTER THE PROCEDURE After your procedure, it is typical to have the following:  You may have some cramping. This normally lasts for a couple days.  You may have bleeding. This can vary from light spotting for a few days to menstrual-like bleeding for 3-7 days. HOME CARE INSTRUCTIONS  Rest for the first 1-2 days after the procedure.  Only take over-the-counter or prescription medicines as directed by your health care provider. Do not take aspirin. It can increase the chances of bleeding.  Take showers instead of baths for 2 weeks or as directed by your health care provider.  Do not drive for 24 hours or as directed.  Do not drink alcohol while taking pain medicine.  Do not use tampons, douche, or have sexual intercourse for 2 weeks or until your health care provider says it is okay.  Take your temperature twice a day for 4-5 days. Write it down each time.  Follow your health care provider's advice about diet, exercise, and lifting.  If you develop constipation, you may:  Take a mild laxative if your health care provider approves.  Add bran foods to your diet.  Drink enough fluids to keep your urine clear or pale yellow.  Try to have someone with you or available to you for the first 24-48 hours, especially if you were given a general anesthetic.  Follow up with your health care provider as directed. SEEK MEDICAL CARE IF:  You feel dizzy or lightheaded.  You feel sick to your stomach (nauseous).  You have abnormal vaginal discharge.  You  have a rash.  You have pain that is not controlled with medicine. SEEK IMMEDIATE MEDICAL CARE IF:  You have bleeding that is heavier than a normal menstrual period.  You have a fever.  You have increasing cramps or pain, not controlled with medicine.  You have new belly (abdominal) pain.  You pass out.  You have pain in the tops of your shoulders (shoulder strap areas).  You have shortness of breath.   This information is not intended to replace advice given to you by your health care provider. Make sure you discuss any questions you have with your health care provider.   Document Released: 06/24/2013 Document Reviewed: 06/24/2013 Elsevier Interactive Patient Education [employer].

## 2016-05-16 ENCOUNTER — Encounter (HOSPITAL_COMMUNITY): Payer: Self-pay | Admitting: Obstetrics & Gynecology

## 2016-05-16 NOTE — Anesthesia Postprocedure Evaluation (Signed)
Anesthesia Post Note  Patient: Trellis Paganini  Procedure(s) Performed: Procedure(s) (LRB): DILATATION AND CURETTAGE /HYSTEROSCOPY (N/A)  Patient location during evaluation: PACU Anesthesia Type: General Level of consciousness: awake and alert Pain management: pain level controlled Vital Signs Assessment: post-procedure vital signs reviewed and stable Respiratory status: spontaneous breathing, nonlabored ventilation, respiratory function stable and patient connected to nasal cannula oxygen Cardiovascular status: blood pressure returned to baseline and stable Postop Assessment: no signs of nausea or vomiting Anesthetic complications: no     Last Vitals:  Vitals:   05/15/16 1245 05/15/16 1330  BP: (!) 147/62 (!) 146/94  Pulse: 92 93  Resp: 14 16  Temp: 36.8 C 37.1 C    Last Pain:  Vitals:   05/15/16 1340  TempSrc:   PainSc: 7    Pain Goal: Patients Stated Pain Goal: 3 (05/15/16 1330)               Reginal Lutes

## 2016-06-13 ENCOUNTER — Ambulatory Visit (INDEPENDENT_AMBULATORY_CARE_PROVIDER_SITE_OTHER): Payer: Medicaid Other | Admitting: Obstetrics & Gynecology

## 2016-06-13 VITALS — BP 137/70 | HR 86 | Ht 67.0 in | Wt 287.1 lb

## 2016-06-13 DIAGNOSIS — Z9889 Other specified postprocedural states: Secondary | ICD-10-CM

## 2016-06-13 NOTE — Patient Instructions (Signed)
Hysteroscopy, Care After Refer to this sheet in the next few weeks. These instructions provide you with information on caring for yourself after your procedure. Your health care provider may also give you more specific instructions. Your treatment has been planned according to current medical practices, but problems sometimes occur. Call your health care provider if you have any problems or questions after your procedure.  WHAT TO EXPECT AFTER THE PROCEDURE After your procedure, it is typical to have the following:  You may have some cramping. This normally lasts for a couple days.  You may have bleeding. This can vary from light spotting for a few days to menstrual-like bleeding for 3-7 days. HOME CARE INSTRUCTIONS  Rest for the first 1-2 days after the procedure.  Only take over-the-counter or prescription medicines as directed by your health care provider. Do not take aspirin. It can increase the chances of bleeding.  Take showers instead of baths for 2 weeks or as directed by your health care provider.  Do not drive for 24 hours or as directed.  Do not drink alcohol while taking pain medicine.  Do not use tampons, douche, or have sexual intercourse for 2 weeks or until your health care provider says it is okay.  Take your temperature twice a day for 4-5 days. Write it down each time.  Follow your health care provider's advice about diet, exercise, and lifting.  If you develop constipation, you may:  Take a mild laxative if your health care provider approves.  Add bran foods to your diet.  Drink enough fluids to keep your urine clear or pale yellow.  Try to have someone with you or available to you for the first 24-48 hours, especially if you were given a general anesthetic.  Follow up with your health care provider as directed. SEEK MEDICAL CARE IF:  You feel dizzy or lightheaded.  You feel sick to your stomach (nauseous).  You have abnormal vaginal discharge.  You  have a rash.  You have pain that is not controlled with medicine. SEEK IMMEDIATE MEDICAL CARE IF:  You have bleeding that is heavier than a normal menstrual period.  You have a fever.  You have increasing cramps or pain, not controlled with medicine.  You have new belly (abdominal) pain.  You pass out.  You have pain in the tops of your shoulders (shoulder strap areas).  You have shortness of breath.   This information is not intended to replace advice given to you by your health care provider. Make sure you discuss any questions you have with your health care provider.   Document Released: 06/24/2013 Document Reviewed: 06/24/2013 Elsevier Interactive Patient Education [employer].

## 2016-06-13 NOTE — Progress Notes (Signed)
History:  60 y.o. No obstetric history on file. here today for 4 week post op check. the patient with no bleeding since surgery.  She denies pain or bleeding.   The following portions of the patient's history were reviewed and updated as appropriate: allergies, current medications, past family history, past medical history, past social history, past surgical history and problem list.  Review of Systems:  Pertinent items are noted in HPI.  Objective:  Physical Exam Blood pressure 137/70, pulse 86, height 5\' 7"  (1.702 m), weight 287 lb 1.6 oz (130.2 kg), last menstrual period 02/22/2016. Gen: NAD   Labs and Imaging 05/15/16 Diagnosis Endometrium, curettage - INACTIVE ENDOMETRIUM, SCANT BENIGN ENDOCERVIX AND ABUNDANT BLOOD. - FRAGMENTS OF BENIGN SQUAMOUS MUCOSA. - NO HYPERPLASIA OR MALIGNANCY   Assessment & Plan:  4 wekk post op check  Pt doing well Reviewed surg path F/u in 1 year or sooner prn  Afra Tricarico L. Harraway-Smith, M.D., Cherlynn June

## 2016-06-26 ENCOUNTER — Other Ambulatory Visit: Payer: Self-pay | Admitting: Primary Care

## 2016-06-26 DIAGNOSIS — Z853 Personal history of malignant neoplasm of breast: Secondary | ICD-10-CM

## 2016-07-17 ENCOUNTER — Other Ambulatory Visit: Payer: Self-pay | Admitting: Primary Care

## 2016-07-17 ENCOUNTER — Other Ambulatory Visit (HOSPITAL_COMMUNITY): Payer: Self-pay | Admitting: Primary Care

## 2016-07-19 ENCOUNTER — Ambulatory Visit
Admission: RE | Admit: 2016-07-19 | Discharge: 2016-07-19 | Disposition: A | Discharge status: Home / Self Care | Payer: Medicaid Other | Source: Ambulatory Visit | Attending: Primary Care | Admitting: Primary Care

## 2016-07-19 DIAGNOSIS — Z853 Personal history of malignant neoplasm of breast: Secondary | ICD-10-CM

## 2016-07-25 ENCOUNTER — Ambulatory Visit (INDEPENDENT_AMBULATORY_CARE_PROVIDER_SITE_OTHER): Payer: Medicaid Other | Admitting: Podiatry

## 2016-07-25 ENCOUNTER — Other Ambulatory Visit: Payer: Self-pay | Admitting: Podiatry

## 2016-07-25 ENCOUNTER — Telehealth: Payer: Self-pay | Admitting: *Deleted

## 2016-07-25 DIAGNOSIS — M799 Soft tissue disorder, unspecified: Secondary | ICD-10-CM | POA: Diagnosis not present

## 2016-07-25 DIAGNOSIS — M7989 Other specified soft tissue disorders: Secondary | ICD-10-CM

## 2016-07-25 DIAGNOSIS — M722 Plantar fascial fibromatosis: Secondary | ICD-10-CM | POA: Diagnosis not present

## 2016-07-25 DIAGNOSIS — M7750 Other enthesopathy of unspecified foot: Secondary | ICD-10-CM

## 2016-07-25 DIAGNOSIS — M779 Enthesopathy, unspecified: Secondary | ICD-10-CM

## 2016-07-25 MED ORDER — TERBINAFINE HCL 250 MG PO TABS: 250.0000 mg | ORAL_TABLET | Freq: Every day | ORAL | 0 refills | Status: DC

## 2016-07-25 NOTE — Addendum Note (Signed)
Addended by: Celene Skeen A on: 07/25/2016 05:05 PM   Modules accepted: Orders

## 2016-07-25 NOTE — Addendum Note (Signed)
Addended by: Celene Skeen A on: 07/25/2016 01:21 PM   Modules accepted: Orders

## 2016-07-25 NOTE — Progress Notes (Signed)
Subjective:     Patient ID: Elizabeth Mclean, female   DOB: October 31, 1955, 60 y.o.   MRN: [id number]  HPI patient states that there is some nodules in the lateral side of the left heel that occurred after she had a pedicure several months ago. Also concerned about continued pain in the Achilles and plantar fashion   Review of Systems     Objective:   Physical Exam Neurovascular status intact with patient noted to have multiple nodules on the lateral side of the left Achilles localized in nature with itching component associated with them. Does not remember any other issues except for the pedicure and also continues to have fasciitis in the heel region bilateral upon palpation    Assessment:     Unknown lesions left posterior heel with possibility for fungal infection may be present    Plan:     H&P both conditions including fat fasciitis tendinitis reviewed and today I did do a proximal nerve block around one prominent lesion and did a 3 mm punch biopsy and applied sterile dressing. We will get the results of this and until we get the results I did go ahead and placed her on a Lamisil 250 mg 1 a day for the next 4 weeks and she is to let us know if this causes her any problems she states that her liver function has been good with no current problems and I do want her to review this with her family doctor prior to start medication

## 2016-07-25 NOTE — Telephone Encounter (Addendum)
Dr. Paulla Dolly states has reviewed pt's health history again and she has hepatitis C, discontinue the Terbinafine until pt has Medical Clearance letter sent to our office. I spoke with pt and gave her the orders from Dr. Paulla Dolly and my office fax [phone] to have the letter sent to our office. Pt states understanding and her liver test last time was high, and she already took a Terbinafine today. I told pt not to take anymore until Dr. Paulla Dolly instructed. I had discontinued the Terbinafine prior to speaking with pt and then reordered to have in chart as ordered by Dr. Regal.07/26/2016-Left leg skin biopsy sent to [facility]. 08/31/2016-Handwritten note on 11/31/2017 Fax Cover Sheet from [facility] received, states "No not with Hep C and impaired liver function." I informed pt and she states she has not taken another one of the pills since someone from Dr. Mellody Drown office told her not too. I told pt that was great and that I would inform Dr. Paulla Dolly of the note. Pt states she had a wart cut out and didn't know how to care for it. I asked the pt if it was bleeding and she said no had a scab. I told her to clean area with antibacterial soap and rinse well cover with a neosporin dressing until the area had a dry hard scab without redness, swelling or draingage. Pt states understanding.

## 2016-07-30 ENCOUNTER — Encounter: Payer: Self-pay | Admitting: *Deleted

## 2016-07-30 LAB — PATHOLOGY

## 2016-08-02 ENCOUNTER — Ambulatory Visit (INDEPENDENT_AMBULATORY_CARE_PROVIDER_SITE_OTHER): Payer: Medicaid Other

## 2016-08-02 ENCOUNTER — Encounter (HOSPITAL_COMMUNITY): Payer: Self-pay | Admitting: Emergency Medicine

## 2016-08-02 ENCOUNTER — Ambulatory Visit (HOSPITAL_COMMUNITY)
Admission: EM | Admit: 2016-08-02 | Discharge: 2016-08-02 | Disposition: A | Discharge status: Home / Self Care | Payer: Medicaid Other | Attending: Emergency Medicine | Admitting: Emergency Medicine

## 2016-08-02 DIAGNOSIS — M1712 Unilateral primary osteoarthritis, left knee: Secondary | ICD-10-CM

## 2016-08-02 DIAGNOSIS — M25562 Pain in left knee: Secondary | ICD-10-CM | POA: Diagnosis not present

## 2016-08-02 MED ORDER — PREDNISONE 10 MG (21) PO TBPK: ORAL_TABLET | ORAL | 0 refills | Status: DC

## 2016-08-02 NOTE — ED Provider Notes (Signed)
CSN: [id number]     Arrival date & time 08/02/16  1432 History   First MD Initiated Contact with Patient 08/02/16 1751     Chief Complaint  Patient presents with  . Knee Pain   (Consider location/radiation/quality/duration/timing/severity/associated sxs/prior Treatment) 60 year old female presents with left knee pain for the past 2 weeks. She was walking up steps when she rotated her left knee outward. Had some immediate pain but pain has become worse in the past few days. She has tried Estate agent and other topical treatments with no relief. She has not tried any OTC anti-inflammatory meds (she will not take any OTC meds that are not recommended or prescribed by a health care provider). She has history of chronic pain and diabetes (on insulin) and takes Percocet or Tramadol as needed. She can take Prednisone but uncertain about other NSAIDs due to aspirin allergy.       Past Medical History:  Diagnosis Date  . Allergy   . Arthritis    hips, shoulders, face  . Bilateral swelling of feet   . Bone spur of foot   . Cancer [facility]) 2011   Left Breast cancer  . Chronic pain   . Depression   . Diabetes mellitus without complication ([place])   . GERD (gastroesophageal reflux disease)   . Hepatitis C   . Hypertension   . Insomnia   . Leg cramps    Past Surgical History:  Procedure Laterality Date  . BREAST SURGERY    . COLONOSCOPY    . epidural steroid injection    . HYSTEROSCOPY W/D&C N/A 05/15/2016   Procedure: DILATATION AND CURETTAGE /HYSTEROSCOPY;  Surgeon: Lavonia Drafts, MD;  Location: [facility];  Service: Gynecology;  Laterality: N/A;  . MOUTH SURGERY    . THUMB SURGERY     Family History  Problem Relation Age of Onset  . Diabetes Mother   . Diabetes Father   . Hypertension Other    Social History  Substance Use Topics  . Smoking status: Former Research scientist (life sciences)  . Smokeless tobacco: Never Used  . Alcohol use No   OB History    No data available     Review of  Systems  Constitutional: Positive for fatigue. Negative for chills and fever.  Musculoskeletal: Positive for arthralgias, back pain and gait problem. Negative for joint swelling.  Skin: Negative for rash and wound.  Neurological: Negative for dizziness, weakness, light-headedness and headaches.  Hematological: Negative for adenopathy.    Allergies  Aspirin; Fexofenadine; and Hydrocodone  Home Medications   Prior to Admission medications   Medication Sig Start Date End Date Taking? Authorizing Provider  Ascorbic Acid (VITAMIN C PO) Take 1 tablet by mouth daily.   Yes Historical Provider, MD  cetirizine (ZYRTEC) 10 MG tablet Take 10 mg by mouth daily.     Yes Historical Provider, MD  furosemide (LASIX) 20 MG tablet Take 1 tablet by mouth daily. 01/09/16  Yes Historical Provider, MD  hydrOXYzine (ATARAX/VISTARIL) 25 MG tablet Take 1 tablet (25 mg total) by mouth every 6 (six) hours as needed for itching. 03/29/16  Yes Noland Fordyce, PA-C  RA VITAMIN D-3 2000 units CAPS Take 1 capsule by mouth daily. 01/09/16  Yes Historical Provider, MD  valsartan-hydrochlorothiazide (DIOVAN HCT) 160-12.5 MG per tablet Take 1 tablet by mouth daily. 11/18/12  Yes Reyne Dumas, MD  Venlafaxine HCl 75 MG TB24 Take 1 tablet (75 mg total) by mouth daily. 01/26/13  Yes Marcy Panning, MD  VESICARE 5 MG  tablet Take 5 mg by mouth daily. 02/08/16  Yes Historical Provider, MD  benzonatate (TESSALON) 100 MG capsule Take 100 mg by mouth 3 (three) times daily as needed for cough.    Historical Provider, MD  cyclobenzaprine (FLEXERIL) 5 MG tablet Take 1 tablet (5 mg total) by mouth 3 (three) times daily as needed for muscle spasms. 07/17/12   Harden Mo, MD  NOVOLOG MIX 70/30 FLEXPEN (70-30) 100 UNIT/ML FlexPen Inject 20-24 Units into the skin 2 (two) times daily. Inject 20 units into the skin each morning and 24 units into the skin each evening. 12/06/15   Historical Provider, MD  oxyCODONE-acetaminophen (PERCOCET/ROXICET) 5-325  MG tablet Take 1 tablet by mouth every 6 (six) hours as needed for severe pain.  04/30/16   Historical Provider, MD  predniSONE (STERAPRED UNI-PAK 21 TAB) 10 MG (21) TBPK tablet Take 6 tabs by mouth 1st day then decrease by 1 tablet daily until finished. 08/02/16   Katy Apo, NP  terbinafine (LAMISIL) 250 MG tablet Take 1 tablet (250 mg total) by mouth daily. 07/25/16   Wallene Huh, DPM  traMADol (ULTRAM) 50 MG tablet Take 1 tablet (50 mg total) by mouth every 8 (eight) hours as needed for pain. 11/27/12   Theodis Blaze, MD   Meds Ordered and Administered this Visit  Medications - No data to display  BP 140/73 (BP Location: Left Arm)   Pulse 89   Temp 98.3 F (36.8 C) (Oral)   Resp 17   LMP 02/22/2016   SpO2 99%  No data found.   Physical Exam  Constitutional: She is oriented to person, place, and time. She appears well-developed and well-nourished. No distress.  Difficulty moving and changing positions due to knee pain.  Cardiovascular: Normal rate and regular rhythm.   Musculoskeletal: She exhibits tenderness. She exhibits no edema.       Left knee: She exhibits decreased range of motion. She exhibits no swelling, no effusion, no ecchymosis, no deformity, no laceration, no erythema, normal alignment and normal patellar mobility. Tenderness found. Medial joint line tenderness noted.       Legs: Has decreased range of motion, particularly with extension of left knee. Tender along lower and medial patellar area. No swelling or redness seen. Good pulses distally. No neuro deficits noted.   Neurological: She is alert and oriented to person, place, and time. She has normal strength. No sensory deficit.  Skin: Skin is warm and dry. Capillary refill takes less than 2 seconds.  Psychiatric: She has a normal mood and affect. Her behavior is normal. Judgment and thought content normal.    Urgent Care Course   Clinical Course     Procedures (including critical care time)  Labs  Review Labs Reviewed - No data to display  Imaging Review Dg Knee Complete 4 Views Left  Result Date: 08/02/2016 CLINICAL DATA:  Pain following twisting injury EXAM: LEFT KNEE - COMPLETE 4+ VIEW COMPARISON:  None. FINDINGS: Frontal, lateral, and bilateral oblique views were obtained. There is no demonstrable fracture or dislocation. No joint effusion. There is moderate narrowing medially and in the patellofemoral joint region. There is spurring laterally and arising from the patella both anteriorly and posteriorly. No erosive change. IMPRESSION: Osteoarthritic change with narrowing most marked medially and in the patellofemoral joint. Areas of spurring noted, primarily laterally and arising from the patella. Spurring along the anterior patella most likely represents a degree of distal quadriceps and proximal patellar tendinosis. No joint effusion. No  fracture or dislocation. Electronically Signed   By: Lowella Grip III M.D.   On: 08/02/2016 16:09     Visual Acuity Review  Right Eye Distance:   Left Eye Distance:   Bilateral Distance:    Right Eye Near:   Left Eye Near:    Bilateral Near:         MDM   1. Acute pain of left knee   2. Osteoarthritis of left knee, unspecified osteoarthritis type    Reviewed x-ray findings with patient- pain due to musculoskeletal injury as well as arthritis. Recommend start Prednisone 10mg  6 day dose pack as directed. Information provided regarding Orthopedic- call tomorrow morning to schedule appointment for further evaluation and treatment or go to ER if pain worsens.      Katy Apo, NP 08/03/16 1045

## 2016-08-02 NOTE — ED Triage Notes (Signed)
The patient presented to the [facility] with a complaint of left knee pain x 2 weeks. The patient reported that she was walking up some stairs and when she stepped her left knee rotated. The patient reported pain upon movement that is worse at night.

## 2016-08-02 NOTE — Discharge Instructions (Signed)
Recommend start Prednisone 6 tablets today then decrease by 1 tablet each day until finished on day 6. Call Orthopedic group tomorrow morning to schedule appointment for further evaluation.

## 2016-08-22 ENCOUNTER — Ambulatory Visit (INDEPENDENT_AMBULATORY_CARE_PROVIDER_SITE_OTHER): Payer: Medicaid Other | Admitting: Podiatry

## 2016-08-22 ENCOUNTER — Encounter: Payer: Self-pay | Admitting: Podiatry

## 2016-08-22 DIAGNOSIS — B079 Viral wart, unspecified: Secondary | ICD-10-CM

## 2016-08-22 DIAGNOSIS — M779 Enthesopathy, unspecified: Secondary | ICD-10-CM | POA: Diagnosis not present

## 2016-08-22 DIAGNOSIS — D492 Neoplasm of unspecified behavior of bone, soft tissue, and skin: Secondary | ICD-10-CM

## 2016-08-23 NOTE — Progress Notes (Signed)
Subjective:     Patient ID: Elizabeth Mclean, female   DOB: 1956-06-28, 60 y.o.   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 Explain what to do if there's any blistering and if there should be any issues she will let us know immediately if not we'll see her back for visit in 4 weeks

## 2016-10-10 ENCOUNTER — Ambulatory Visit (INDEPENDENT_AMBULATORY_CARE_PROVIDER_SITE_OTHER): Payer: Medicaid Other | Admitting: Podiatry

## 2016-10-10 ENCOUNTER — Encounter: Payer: Self-pay | Admitting: Podiatry

## 2016-10-10 DIAGNOSIS — M722 Plantar fascial fibromatosis: Secondary | ICD-10-CM

## 2016-10-10 DIAGNOSIS — D492 Neoplasm of unspecified behavior of bone, soft tissue, and skin: Secondary | ICD-10-CM | POA: Diagnosis not present

## 2016-10-10 DIAGNOSIS — B079 Viral wart, unspecified: Secondary | ICD-10-CM

## 2016-10-10 DIAGNOSIS — M779 Enthesopathy, unspecified: Secondary | ICD-10-CM

## 2016-10-10 MED ORDER — TRIAMCINOLONE ACETONIDE 10 MG/ML IJ SUSP
10.0000 mg | Freq: Once | INTRAMUSCULAR | Status: AC
  Administered 2016-10-10: 10 mg

## 2016-10-10 NOTE — Progress Notes (Signed)
Subjective:     Patient ID: Elizabeth Mclean, female   DOB: [birth year], 61 y.o.   MRN: [id number]  HPI patient presents stating she's had a lot of pain in her ankles and she was wondering about these lesions   Review of Systems     Objective:   Physical Exam Patient's ankles bilateral are inflamed in the sinus tarsi and patient has lesions on the lateral side of the left foot which remain and are occasionally tender    Assessment:     Inflammatory capsulitis sinus tarsi bilateral with lesions on the left lateral foot which appear to be consistent with probable verruca plantaris according to previous biopsy    Plan:     H&P conditions reviewed and injected the capsule bilateral 3 mg Kenalog 5 mg Xylocaine and discussed continued treatment of the warts and patient will see dermatologist which I reviewed

## 2016-12-05 ENCOUNTER — Ambulatory Visit: Payer: Medicaid Other | Admitting: Podiatry

## 2016-12-14 ENCOUNTER — Encounter: Payer: Self-pay | Admitting: Podiatry

## 2016-12-14 ENCOUNTER — Ambulatory Visit (INDEPENDENT_AMBULATORY_CARE_PROVIDER_SITE_OTHER): Payer: Medicaid Other

## 2016-12-14 ENCOUNTER — Ambulatory Visit (INDEPENDENT_AMBULATORY_CARE_PROVIDER_SITE_OTHER): Payer: Medicaid Other | Admitting: Podiatry

## 2016-12-14 ENCOUNTER — Telehealth: Payer: Self-pay | Admitting: *Deleted

## 2016-12-14 DIAGNOSIS — M779 Enthesopathy, unspecified: Secondary | ICD-10-CM

## 2016-12-14 DIAGNOSIS — M79672 Pain in left foot: Principal | ICD-10-CM

## 2016-12-14 DIAGNOSIS — M7752 Other enthesopathy of left foot: Secondary | ICD-10-CM

## 2016-12-14 DIAGNOSIS — G629 Polyneuropathy, unspecified: Secondary | ICD-10-CM

## 2016-12-14 DIAGNOSIS — M79671 Pain in right foot: Secondary | ICD-10-CM

## 2016-12-14 MED ORDER — GABAPENTIN 300 MG PO CAPS: 300.0000 mg | ORAL_CAPSULE | Freq: Three times a day (TID) | ORAL | 3 refills | Status: DC

## 2016-12-14 NOTE — Telephone Encounter (Signed)
Pt states her medication is not at the [facility] on [address]. I changed Gabapentin to go to the [facility].

## 2016-12-17 NOTE — Progress Notes (Signed)
Subjective:     Patient ID: Elizabeth Mclean, female   DOB: 1956-05-06, 61 y.o.   MRN: [id number]  HPI patient presents stating that the back of her heel is hurting and she was wondering if anything could be done   Review of Systems     Objective:   Physical Exam  Neurovascular status intact with patient's posterior left heel showing swelling but moderate in its intensity with obesity as a complicating factor    Assessment:     Patient has numerous health issues with inflammation and obesity as big complications to her overall health    Plan:     Reviewed condition at great length and do not recommend any aggressive treatment due to the frailty of health at the current time. Did advise on physical therapy and utilization of anti-inflammatories

## 2017-01-28 ENCOUNTER — Ambulatory Visit (HOSPITAL_BASED_OUTPATIENT_CLINIC_OR_DEPARTMENT_OTHER): Payer: Medicaid Other | Admitting: Adult Health

## 2017-01-28 ENCOUNTER — Encounter: Payer: Medicaid Other | Admitting: Nurse Practitioner

## 2017-01-28 ENCOUNTER — Encounter: Payer: Self-pay | Admitting: Adult Health

## 2017-01-28 VITALS — BP 151/62 | HR 87 | Temp 97.8°F | Resp 20 | Ht 67.0 in | Wt 285.5 lb

## 2017-01-28 DIAGNOSIS — Z853 Personal history of malignant neoplasm of breast: Secondary | ICD-10-CM | POA: Diagnosis not present

## 2017-01-28 DIAGNOSIS — E119 Type 2 diabetes mellitus without complications: Secondary | ICD-10-CM | POA: Diagnosis not present

## 2017-01-28 DIAGNOSIS — D0512 Intraductal carcinoma in situ of left breast: Secondary | ICD-10-CM

## 2017-01-28 NOTE — Progress Notes (Signed)
CLINIC:  Survivorship   REASON FOR VISIT:  Routine follow-up for history of breast cancer.   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  #2 she then received radiation therapy to the left breast from 09/21/2010 to 11/09/2010.  #3 she was then started on tamoxifen 20 mg daily in March 2012 completed 01/31/2015.   INTERVAL HISTORY:  Elizabeth Mclean presents to the Survivorship Clinic today for routine follow-up for her history of breast cancer.  Overall, she reports feeling quite well. She is doing well from a breast cancer perspective.  She does have some residual left breast tenderness, but otherwise, has no issues. She does have a lot of co morbidities that are challenging.  She has diabetes, chronic lower back pain, knee pain, and pain in her feet.  She sees her PCP regularly.  She has not exercised due to having menses, and then she lost the desire to go.  She says she knows that she needs to go.    REVIEW OF SYSTEMS:  Review of Systems  Constitutional: Negative for appetite change, chills, diaphoresis, fatigue, fever and unexpected weight change.  HENT:   Negative for hearing loss and lump/mass.   Eyes: Negative for eye problems and icterus.  Respiratory: Negative for chest tightness, cough and shortness of breath.   Cardiovascular: Negative for chest pain and palpitations.  Gastrointestinal: Negative for abdominal distention, abdominal pain, constipation and diarrhea.  Endocrine: Negative for hot flashes.  Genitourinary: Positive for vaginal bleeding (has GYN appt 5/30). Negative for difficulty urinating and dyspareunia.   Musculoskeletal: Positive for back pain (managed by her PCP, has undergone imaging for this). Negative for gait problem.  Neurological: Negative for dizziness, gait problem and light-headedness.  Hematological: Negative for adenopathy. Does not bruise/bleed easily.    Psychiatric/Behavioral: Negative for depression. The patient is not nervous/anxious.   Breast: Denies any new nodularity, masses, tenderness, nipple changes, or nipple discharge.    PAST MEDICAL/SURGICAL HISTORY:  Past Medical History:  Diagnosis Date  . Allergy   . Arthritis    hips, shoulders, face  . Bilateral swelling of feet   . Bone spur of foot   . Cancer [facility]) 2011   Left Breast cancer  . Chronic pain   . Depression   . Diabetes mellitus without complication ([place])   . GERD (gastroesophageal reflux disease)   . Hepatitis C   . Hypertension   . Insomnia   . Leg cramps    Past Surgical History:  Procedure Laterality Date  . BREAST SURGERY    . COLONOSCOPY    . epidural steroid injection    . HYSTEROSCOPY W/D&C N/A 05/15/2016   Procedure: DILATATION AND CURETTAGE /HYSTEROSCOPY;  Surgeon: Lavonia Drafts, MD;  Location: [facility];  Service: Gynecology;  Laterality: N/A;  . MOUTH SURGERY    . THUMB SURGERY       ALLERGIES:  Allergies  Allergen Reactions  . Aspirin Other (See Comments)    Stomach aches   . Fexofenadine     REACTION: Leg and back pain  . Hydrocodone Itching     CURRENT MEDICATIONS:  Outpatient Encounter Prescriptions as of 01/28/2017  Medication Sig  . Ascorbic Acid (VITAMIN C PO) Take 1 tablet by mouth daily.  . benzonatate (TESSALON) 100 MG capsule Take 100 mg by mouth 3 (three) times daily as needed for cough.  . cetirizine (ZYRTEC) 10 MG tablet Take 10 mg  by mouth daily.    . cyclobenzaprine (FLEXERIL) 5 MG tablet Take 1 tablet (5 mg total) by mouth 3 (three) times daily as needed for muscle spasms.  . furosemide (LASIX) 20 MG tablet Take 1 tablet by mouth daily.  Marland Kitchen gabapentin (NEURONTIN) 300 MG capsule Take 1 capsule (300 mg total) by mouth 3 (three) times daily.  Marland Kitchen gabapentin (NEURONTIN) 300 MG capsule Take 1 capsule (300 mg total) by mouth 3 (three) times daily.  . hydrOXYzine (ATARAX/VISTARIL) 25 MG tablet Take 1 tablet (25 mg  total) by mouth every 6 (six) hours as needed for itching.  Marland Kitchen NOVOLOG MIX 70/30 FLEXPEN (70-30) 100 UNIT/ML FlexPen Inject 20-24 Units into the skin 2 (two) times daily. Inject 20 units into the skin each morning and 24 units into the skin each evening.  Marland Kitchen oxyCODONE-acetaminophen (PERCOCET/ROXICET) 5-325 MG tablet Take 1 tablet by mouth every 6 (six) hours as needed for severe pain.   Marland Kitchen RA VITAMIN D-3 2000 units CAPS Take 1 capsule by mouth daily.  Marland Kitchen spironolactone (ALDACTONE) 25 MG tablet 25 mg.  . terbinafine (LAMISIL) 250 MG tablet Take 1 tablet (250 mg total) by mouth daily.  . traMADol (ULTRAM) 50 MG tablet Take 1 tablet (50 mg total) by mouth every 8 (eight) hours as needed for pain.  . valsartan-hydrochlorothiazide (DIOVAN HCT) 160-12.5 MG per tablet Take 1 tablet by mouth daily.  . Venlafaxine HCl 75 MG TB24 Take 1 tablet (75 mg total) by mouth daily.  . VESICARE 5 MG tablet Take 5 mg by mouth daily.   No facility-administered encounter medications on file as of 01/28/2017.      ONCOLOGIC FAMILY HISTORY:  Family History  Problem Relation Age of Onset  . Diabetes Mother   . Diabetes Father   . Hypertension Other     GENETIC COUNSELING/TESTING: Not indicated at this time  SOCIAL HISTORY:  [place] is single and lives with her best friend Elizabeth Mclean in [place], [place].  She has 3 children and they live in [place] and [place].  Elizabeth Mclean is currently on disability due to her back issues.  She denies any current or history of tobacco, alcohol, or illicit drug use.     PHYSICAL EXAMINATION:  Vital Signs: Vitals:   01/28/17 1402  BP: (!) 151/62  Pulse: 87  Resp: 20  Temp: 97.8 F (36.6 C)   Filed Weights   01/28/17 1402  Weight: 285 lb 8 oz (129.5 kg)   General: Well-nourished, well-appearing female in no acute distress.  Unaccompanied today.   HEENT: Head is normocephalic.  Pupils equal and reactive to light. Conjunctivae clear without exudate.   Sclerae anicteric. Oral mucosa is pink, moist.  Oropharynx is pink without lesions or erythema.  Lymph: No cervical, supraclavicular, or infraclavicular lymphadenopathy noted on palpation.  Cardiovascular: Regular rate and rhythm.Marland Kitchen Respiratory: Clear to auscultation bilaterally. Chest expansion symmetric; breathing non-labored.  Breast Exam:  -Left breast: No appreciable masses on palpation. No skin redness, thickening, or peau d'orange appearance; no nipple retraction or nipple discharge; mild distortion in symmetry at previous lumpectomy site well healed scar without erythema or nodularity.  -Right breast: No appreciable masses on palpation. No skin redness, thickening, or peau d'orange appearance; no nipple retraction or nipple discharge;  -Axilla: No axillary adenopathy bilaterally.  GI: Abdomen soft and round; non-tender, non-distended. Bowel sounds normoactive. No hepatosplenomegaly.   GU: Deferred.  Neuro: No focal deficits. Steady gait.  Psych: Mood and affect normal and appropriate for situation.  Extremities: No edema. Skin: Warm and dry.  LABORATORY DATA:  None for this visit   DIAGNOSTIC IMAGING:  Most recent mammogram:      ASSESSMENT AND PLAN:  Ms.. Mclean is a pleasant 61 y.o. female with history of Stage 0 left breast DCIS, ER+/PR+/HER2-, diagnosed in 08/2010, treated with lumpectomy, adjuvant radiation therapy, and anti-estrogen therapy with Tamoxifen x 4 years.  She presents to the Survivorship Clinic for surveillance and routine follow-up.   1. History of breast cancer:  Elizabeth Mclean is currently clinically and radiographically without evidence of disease or recurrence of breast cancer. She will be due for mammogram on 07/19/2017; orders placed today.  I encouraged her to call me with any questions or concerns before her next visit at the cancer center, and I would be happy to see her sooner, if needed.    2. Bone health:  Given Elizabeth Mclean's age, history of breast  cancer she is at slight risk for bone demineralization. She was encouraged to increase her consumption of foods rich in calcium, as well as increase her weight-bearing activities.  She was given education on specific food and activities to promote bone health.  I will defer to her PCP for ordering and management of her bone density testing.    3. Cancer screening:  Due to Elizabeth Mclean's history and her age, she should receive screening for skin cancers, colon cancer, and gynecologic cancers. She was encouraged to follow-up with her PCP for appropriate cancer screenings.   4. Health maintenance and wellness promotion: Elizabeth Mclean was encouraged to consume 5-7 servings of fruits and vegetables per day. She was also encouraged to engage in moderate to vigorous exercise for 30 minutes per day most days of the week. She was instructed to limit her alcohol consumption and continue to abstain from tobacco use.    Dispo:  -Return to cancer center in one year for LTS follow up -Mammogram in 07/2017 at the breast center   A total of (30) minutes of face-to-face time was spent with this patient with greater than 50% of that time in counseling and care-coordination.   Gardenia Phlegm, NP Survivorship Program [facility] [phone]   Note: PRIMARY CARE PROVIDER Kerin Perna, [place] [phone]

## 2017-01-31 ENCOUNTER — Ambulatory Visit (INDEPENDENT_AMBULATORY_CARE_PROVIDER_SITE_OTHER): Payer: Medicaid Other | Admitting: Podiatry

## 2017-01-31 DIAGNOSIS — M779 Enthesopathy, unspecified: Secondary | ICD-10-CM

## 2017-01-31 DIAGNOSIS — M7752 Other enthesopathy of left foot: Secondary | ICD-10-CM

## 2017-01-31 DIAGNOSIS — M7751 Other enthesopathy of right foot: Secondary | ICD-10-CM

## 2017-01-31 MED ORDER — TRIAMCINOLONE ACETONIDE 10 MG/ML IJ SUSP
10.0000 mg | Freq: Once | INTRAMUSCULAR | Status: AC
  Administered 2017-01-31: 10 mg

## 2017-02-01 ENCOUNTER — Ambulatory Visit: Payer: Medicaid Other | Admitting: Podiatry

## 2017-02-04 NOTE — Progress Notes (Signed)
Subjective:    Patient ID: Elizabeth Mclean, female   DOB: 61 y.o.   MRN: [id number]   HPI patient presents stating that she's having a lot of pain in the left Achilles outside and the pains in the heels is sore.    ROS      Objective:  Physical Exam Neurovascular status intact with inflammation on the lateral side of the peroneal tendon group bilateral that's localized in nature with no indication of pathology of tissue    Assessment:    Tendinitis     Plan:    Careful sheath injections administered 3 mg Kenalog 5 mg Xylocaine

## 2017-02-13 ENCOUNTER — Encounter: Payer: Self-pay | Admitting: Obstetrics & Gynecology

## 2017-02-13 ENCOUNTER — Ambulatory Visit: Payer: Medicaid Other | Admitting: Obstetrics & Gynecology

## 2017-03-12 ENCOUNTER — Other Ambulatory Visit (HOSPITAL_COMMUNITY)
Admission: RE | Admit: 2017-03-12 | Discharge: 2017-03-12 | Disposition: A | Discharge status: Home / Self Care | Payer: Medicaid Other | Source: Ambulatory Visit | Attending: Obstetrics & Gynecology | Admitting: Obstetrics & Gynecology

## 2017-03-12 ENCOUNTER — Encounter: Payer: Self-pay | Admitting: Obstetrics & Gynecology

## 2017-03-12 ENCOUNTER — Ambulatory Visit (INDEPENDENT_AMBULATORY_CARE_PROVIDER_SITE_OTHER): Payer: Medicaid Other | Admitting: Obstetrics & Gynecology

## 2017-03-12 VITALS — BP 128/51 | HR 85 | Ht 67.0 in | Wt 293.2 lb

## 2017-03-12 DIAGNOSIS — N950 Postmenopausal bleeding: Secondary | ICD-10-CM | POA: Diagnosis not present

## 2017-03-12 MED ORDER — MEGESTROL ACETATE 40 MG PO TABS: 40.0000 mg | ORAL_TABLET | Freq: Two times a day (BID) | ORAL | 3 refills | Status: DC

## 2017-03-12 NOTE — Progress Notes (Signed)
Abnormal bleeding started on June 4th and ended on June 23rd.

## 2017-03-12 NOTE — Progress Notes (Signed)
[place] scheduled for July 5th @ 1300.  Pt notified.

## 2017-03-12 NOTE — Patient Instructions (Signed)

## 2017-03-12 NOTE — Progress Notes (Signed)
History:  61 y.o. Elizabeth Mclean here today for PMPB. LMP 2001. Pt reports having bleeding from 6/4-23/2018 . Pt is s/p hysteroscopy 05/15/2016 which revealed inactive endometrium. Pt reports once episode of bleeding for 2 days after her procedure last Aug.  She reports that the bleeding started out as pain.  Followed by bleeding that started in early June that just ended. Pt denies bleeding today. She had 2 significant deaths  6 days apart and worries that it was related to that stress or loss. Pt reports that the pain is resolved but, did occur mildly when the bleeding was starting. The bleeding was reported as heavy - running down her legs and causing her to wear pull ups.  Pt reports ~9 days of the heavy bleeding.   The following portions of the patient's history were reviewed and updated as appropriate: allergies, current medications, past family history, past medical history, past social history, past surgical history and problem list.  Review of Systems:  Pertinent items are noted in HPI.   Objective:  Physical Exam Blood pressure (!) 128/51, pulse 85, height 5\' 7"  (1.702 m), weight 293 lb 3.2 oz (133 kg), last menstrual period 02/22/2016.   The indications for endometrial biopsy were reviewed.   Risks of the biopsy including cramping, bleeding, infection, uterine perforation, inadequate specimen and need for additional procedures  were discussed. The patient states she understands and agrees to undergo procedure today. Consent was signed. Time out was performed. Urine HCG was negative. A sterile speculum was placed in the patient's vagina and the cervix was prepped with Betadine. A single-toothed tenaculum was placed on the anterior lip of the cervix to stabilize it. The 3 mm pipelle was introduced into the endometrial cavity without difficulty to a depth of 10cm, and a moderate amount of tissue was obtained and sent to pathology. The instruments were removed from the patient's vagina. Minimal bleeding  from the cervix was noted. The patient tolerated the procedure well.    04/17/2016 Adequacy Reason Satisfactory for evaluation, endocervical/transformation zone component PRESENT. Diagnosis NEGATIVE FOR INTRAEPITHELIAL LESIONS OR MALIGNANCY. Elizabeth Mclean Runner, broadcasting/film/video (Case signed 04/18/2016) Source CervicoVaginal Pap [ThinPrep Imaged] Molecular Results Test Result HPV High Risk NOT DETECTED Assessment & Plan:  PMPB  Pt s/p endo bx  Pelvic US   Megace 40mg  bid  F/u in 4 weeks or sooner prn  Routine post-procedure instructions were given to the patient. The patient will follow up to  review the results and for further management.   Elizabeth Mclean, M.D., Cherlynn June

## 2017-03-21 ENCOUNTER — Ambulatory Visit (HOSPITAL_COMMUNITY): Admission: RE | Admit: 2017-03-21 | Payer: Medicaid Other | Source: Ambulatory Visit

## 2017-03-21 ENCOUNTER — Telehealth: Payer: Self-pay

## 2017-03-21 NOTE — Telephone Encounter (Signed)
-----   Message from Lavonia Drafts, MD sent at 03/19/2017 11:20 AM EDT ----- Please call pt. Her endo bx was negative.  She should f/u as scheduled.  Thx, clh-S

## 2017-03-21 NOTE — Telephone Encounter (Signed)
Called patient in regards to her endo bx results. She was very happy!

## 2017-04-10 ENCOUNTER — Ambulatory Visit (INDEPENDENT_AMBULATORY_CARE_PROVIDER_SITE_OTHER): Payer: Medicaid Other | Admitting: Obstetrics & Gynecology

## 2017-04-10 ENCOUNTER — Encounter: Payer: Self-pay | Admitting: Obstetrics & Gynecology

## 2017-04-10 VITALS — BP 149/63 | HR 89 | Wt 298.1 lb

## 2017-04-10 DIAGNOSIS — N950 Postmenopausal bleeding: Secondary | ICD-10-CM

## 2017-04-10 NOTE — Progress Notes (Signed)
History:  61 y.o. No obstetric history on file. here today for f/u of PMPB. Pt missed her [place] appt.  Pt reports no bleeding since her last visit. Remains on Megace 40mg  daily. Wants to loose weight.    The following portions of the patient's history were reviewed and updated as appropriate: allergies, current medications, past family history, past medical history, past social history, past surgical history and problem list.  Review of Systems:  Pertinent items are noted in HPI.   Objective:  Physical Exam Blood pressure (!) 149/63, pulse 89, weight 298 lb 1.6 oz (135.2 kg), last menstrual period 02/22/2016. CONSTITUTIONAL: Well-developed, well-nourished female in no acute distress.  HENT:  Normocephalic, atraumatic EYES: Conjunctivae and EOM are normal. No scleral icterus.  NECK: Normal range of motion SKIN: Skin is warm and dry. No rash noted. Not diaphoretic.No pallor. Custar: Alert and oriented to person, place, and time. Normal coordination.   Labs and Imaging Diagnosis 6/26/20187 Endometrium, biopsy - ATROPHIC APPEARING ENDOMETRIUM. - THERE IS NO EVIDENCE OF HYPERPLASIA OR MALIGNANCY.  Assessment & Plan:  PMPB- improved Did not get [place].   Pelvic US Pt wants to be started on a diet- referred back to her PCP Reschedule pelvic US Megace 40mg  daily for 3 months F/u in 6 months or sooner prn Reviewed Endo Bx results  Lucky Trotta L. Harraway-Smith, M.D., Cherlynn June

## 2017-04-10 NOTE — Patient Instructions (Signed)

## 2017-04-17 ENCOUNTER — Ambulatory Visit (HOSPITAL_COMMUNITY)
Admission: RE | Admit: 2017-04-17 | Discharge: 2017-04-17 | Disposition: A | Discharge status: Home / Self Care | Payer: Medicaid Other | Source: Ambulatory Visit | Attending: Obstetrics & Gynecology | Admitting: Obstetrics & Gynecology

## 2017-04-17 DIAGNOSIS — N950 Postmenopausal bleeding: Secondary | ICD-10-CM

## 2017-04-17 DIAGNOSIS — R188 Other ascites: Secondary | ICD-10-CM | POA: Insufficient documentation

## 2017-04-23 ENCOUNTER — Telehealth: Payer: Self-pay | Admitting: General Practice

## 2017-04-23 NOTE — Telephone Encounter (Signed)
Called patient & informed her of results & recommendation. Patient verbalized understanding & states she is still having pain at the top of her stomach and sometimes at the bottom and when she eats the food sometimes goes right through her. Discussed with patient that sounds related to her stomach not to this or the bleeding and she should contact her pcp. Patient verbalized understanding & had no questions

## 2017-04-23 NOTE — Telephone Encounter (Signed)
-----   Message from Lavonia Drafts, MD sent at 04/22/2017  7:23 PM EDT ----- Please call pt. Her [place] was WNL. The endometrium was not well seen. She should cont the Megace and f/u in 6 months per our conversation prev.  Thx, clh-S

## 2017-05-16 ENCOUNTER — Encounter: Payer: Self-pay | Admitting: Podiatry

## 2017-05-16 ENCOUNTER — Ambulatory Visit (INDEPENDENT_AMBULATORY_CARE_PROVIDER_SITE_OTHER): Payer: Medicaid Other | Admitting: Podiatry

## 2017-05-16 DIAGNOSIS — M7752 Other enthesopathy of left foot: Secondary | ICD-10-CM | POA: Diagnosis not present

## 2017-05-16 DIAGNOSIS — M7751 Other enthesopathy of right foot: Secondary | ICD-10-CM

## 2017-05-16 DIAGNOSIS — M779 Enthesopathy, unspecified: Secondary | ICD-10-CM

## 2017-05-16 MED ORDER — TRIAMCINOLONE ACETONIDE 10 MG/ML IJ SUSP
10.0000 mg | Freq: Once | INTRAMUSCULAR | Status: AC
  Administered 2017-05-16: 10 mg

## 2017-05-16 NOTE — Progress Notes (Signed)
Subjective:    Patient ID: Elizabeth Mclean, female   DOB: 61 y.o.   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

## 2017-05-17 ENCOUNTER — Telehealth: Payer: Self-pay | Admitting: General Practice

## 2017-05-17 NOTE — Telephone Encounter (Signed)
Called patient to see how she has been feeling taking the megace. No answer- left message stating we are calling to see how you are doing on the medication and if you are feeling better. Please call us back and let us know.

## 2017-06-17 ENCOUNTER — Encounter (HOSPITAL_COMMUNITY): Payer: Self-pay

## 2017-06-17 ENCOUNTER — Inpatient Hospital Stay (HOSPITAL_COMMUNITY)
Admission: AD | Admit: 2017-06-17 | Discharge: 2017-06-21 | DRG: 433 | Disposition: A | Discharge status: Home / Self Care | Payer: Medicaid Other | Source: Ambulatory Visit | Attending: Cardiology | Admitting: Cardiology

## 2017-06-17 DIAGNOSIS — E440 Moderate protein-calorie malnutrition: Secondary | ICD-10-CM | POA: Diagnosis present

## 2017-06-17 DIAGNOSIS — K7469 Other cirrhosis of liver: Principal | ICD-10-CM | POA: Diagnosis present

## 2017-06-17 DIAGNOSIS — D649 Anemia, unspecified: Secondary | ICD-10-CM | POA: Diagnosis present

## 2017-06-17 DIAGNOSIS — K7460 Unspecified cirrhosis of liver: Secondary | ICD-10-CM | POA: Insufficient documentation

## 2017-06-17 DIAGNOSIS — R748 Abnormal levels of other serum enzymes: Secondary | ICD-10-CM

## 2017-06-17 DIAGNOSIS — Z923 Personal history of irradiation: Secondary | ICD-10-CM | POA: Diagnosis not present

## 2017-06-17 DIAGNOSIS — Z6841 Body Mass Index (BMI) 40.0 and over, adult: Secondary | ICD-10-CM | POA: Diagnosis not present

## 2017-06-17 DIAGNOSIS — Z87891 Personal history of nicotine dependence: Secondary | ICD-10-CM | POA: Diagnosis not present

## 2017-06-17 DIAGNOSIS — B182 Chronic viral hepatitis C: Secondary | ICD-10-CM | POA: Diagnosis present

## 2017-06-17 DIAGNOSIS — I10 Essential (primary) hypertension: Secondary | ICD-10-CM | POA: Diagnosis present

## 2017-06-17 DIAGNOSIS — D696 Thrombocytopenia, unspecified: Secondary | ICD-10-CM | POA: Diagnosis present

## 2017-06-17 DIAGNOSIS — Z833 Family history of diabetes mellitus: Secondary | ICD-10-CM | POA: Diagnosis not present

## 2017-06-17 DIAGNOSIS — Z853 Personal history of malignant neoplasm of breast: Secondary | ICD-10-CM | POA: Diagnosis not present

## 2017-06-17 DIAGNOSIS — I509 Heart failure, unspecified: Secondary | ICD-10-CM

## 2017-06-17 DIAGNOSIS — R188 Other ascites: Secondary | ICD-10-CM | POA: Diagnosis present

## 2017-06-17 DIAGNOSIS — E119 Type 2 diabetes mellitus without complications: Secondary | ICD-10-CM | POA: Diagnosis present

## 2017-06-17 DIAGNOSIS — N179 Acute kidney failure, unspecified: Secondary | ICD-10-CM | POA: Diagnosis present

## 2017-06-17 LAB — CBC WITH DIFFERENTIAL/PLATELET
BASOS PCT: 0 %
Basophils Absolute: 0 10*3/uL (ref 0.0–0.1)
EOS ABS: 0.1 10*3/uL (ref 0.0–0.7)
Eosinophils Relative: 1 %
HCT: 28.4 % — ABNORMAL LOW (ref 36.0–46.0)
Hemoglobin: 10.1 g/dL — ABNORMAL LOW (ref 12.0–15.0)
Lymphocytes Relative: 19 %
Lymphs Abs: 1.4 10*3/uL (ref 0.7–4.0)
MCH: 33.9 pg (ref 26.0–34.0)
MCHC: 35.6 g/dL (ref 30.0–36.0)
MCV: 95.3 fL (ref 78.0–100.0)
MONO ABS: 1 10*3/uL (ref 0.1–1.0)
MONOS PCT: 14 %
Neutro Abs: 4.7 10*3/uL (ref 1.7–7.7)
Neutrophils Relative %: 66 %
Platelets: DECREASED 10*3/uL (ref 150–400)
RBC: 2.98 MIL/uL — ABNORMAL LOW (ref 3.87–5.11)
RDW: 17.9 % — AB (ref 11.5–15.5)
WBC: 7.2 10*3/uL (ref 4.0–10.5)

## 2017-06-17 LAB — COMPREHENSIVE METABOLIC PANEL
ALBUMIN: 2.1 g/dL — AB (ref 3.5–5.0)
ALT: 86 U/L — ABNORMAL HIGH (ref 14–54)
ANION GAP: 7 (ref 5–15)
AST: 159 U/L — ABNORMAL HIGH (ref 15–41)
Alkaline Phosphatase: 101 U/L (ref 38–126)
BILIRUBIN TOTAL: 3.4 mg/dL — AB (ref 0.3–1.2)
BUN: 14 mg/dL (ref 6–20)
CO2: 22 mmol/L (ref 22–32)
Calcium: 8.2 mg/dL — ABNORMAL LOW (ref 8.9–10.3)
Chloride: 106 mmol/L (ref 101–111)
Creatinine, Ser: 1.03 mg/dL — ABNORMAL HIGH (ref 0.44–1.00)
GFR calc Af Amer: >60 mL/min (ref >60)
GFR calc non Af Amer: 57 mL/min — ABNORMAL LOW (ref >60)
GLUCOSE: 92 mg/dL (ref 65–99)
Potassium: 4.1 mmol/L (ref 3.5–5.1)
SODIUM: 135 mmol/L (ref 135–145)
TOTAL PROTEIN: 6.8 g/dL (ref 6.5–8.1)

## 2017-06-17 LAB — PROTIME-INR
INR: 1.87
Prothrombin Time: 21.4 seconds — ABNORMAL HIGH (ref 11.4–15.2)

## 2017-06-17 MED ORDER — HEPARIN SODIUM (PORCINE) 5000 UNIT/ML IJ SOLN
5000.0000 [IU] | Freq: Three times a day (TID) | INTRAMUSCULAR | Status: DC
  Administered 2017-06-17 – 2017-06-21 (×10): 5000 [IU] via SUBCUTANEOUS
  Filled 2017-06-17 (×10): qty 1

## 2017-06-17 MED ORDER — CARVEDILOL 3.125 MG PO TABS
3.1250 mg | ORAL_TABLET | Freq: Two times a day (BID) | ORAL | Status: DC
  Administered 2017-06-17 – 2017-06-19 (×4): 3.125 mg via ORAL
  Filled 2017-06-17 (×4): qty 1

## 2017-06-17 MED ORDER — GABAPENTIN 300 MG PO CAPS: 300.0000 mg | ORAL_CAPSULE | Freq: Three times a day (TID) | ORAL | Status: DC

## 2017-06-17 MED ORDER — POTASSIUM CHLORIDE CRYS ER 20 MEQ PO TBCR
20.0000 meq | EXTENDED_RELEASE_TABLET | Freq: Every day | ORAL | Status: DC
  Administered 2017-06-18 – 2017-06-20 (×3): 20 meq via ORAL
  Filled 2017-06-17 (×3): qty 1

## 2017-06-17 MED ORDER — SODIUM CHLORIDE 0.9% FLUSH
3.0000 mL | Freq: Two times a day (BID) | INTRAVENOUS | Status: DC
  Administered 2017-06-17 – 2017-06-20 (×6): 3 mL via INTRAVENOUS

## 2017-06-17 MED ORDER — ACETAMINOPHEN 325 MG PO TABS
650.0000 mg | ORAL_TABLET | ORAL | Status: DC | PRN
Start: 2017-06-17 — End: 2017-06-21

## 2017-06-17 MED ORDER — ONDANSETRON HCL 4 MG/2ML IJ SOLN: 4.0000 mg | Freq: Four times a day (QID) | INTRAMUSCULAR | Status: DC | PRN

## 2017-06-17 MED ORDER — RIFAXIMIN 550 MG PO TABS
550.0000 mg | ORAL_TABLET | Freq: Two times a day (BID) | ORAL | Status: DC
  Administered 2017-06-17 – 2017-06-21 (×8): 550 mg via ORAL
  Filled 2017-06-17 (×9): qty 1

## 2017-06-17 MED ORDER — LOSARTAN POTASSIUM 50 MG PO TABS
50.0000 mg | ORAL_TABLET | Freq: Every day | ORAL | Status: DC
  Administered 2017-06-17 – 2017-06-19 (×3): 50 mg via ORAL
  Filled 2017-06-17 (×3): qty 1

## 2017-06-17 MED ORDER — VENLAFAXINE HCL ER 75 MG PO TB24: 1.0000 | ORAL_TABLET | Freq: Every day | ORAL | Status: DC

## 2017-06-17 MED ORDER — SODIUM CHLORIDE 0.9% FLUSH: 3.0000 mL | INTRAVENOUS | Status: DC | PRN

## 2017-06-17 MED ORDER — FUROSEMIDE 10 MG/ML IJ SOLN
40.0000 mg | Freq: Four times a day (QID) | INTRAMUSCULAR | Status: DC
  Administered 2017-06-18 – 2017-06-19 (×5): 40 mg via INTRAVENOUS
  Filled 2017-06-17 (×6): qty 4

## 2017-06-17 MED ORDER — SODIUM CHLORIDE 0.9 % IV SOLN: 250.0000 mL | INTRAVENOUS | Status: DC | PRN

## 2017-06-17 MED ORDER — SPIRONOLACTONE 25 MG PO TABS
25.0000 mg | ORAL_TABLET | Freq: Two times a day (BID) | ORAL | Status: DC
  Administered 2017-06-17 – 2017-06-19 (×4): 25 mg via ORAL
  Filled 2017-06-17 (×4): qty 1

## 2017-06-17 NOTE — H&P (Signed)
[place] 2017/07/15 5:56 AM Location: [facility] Patient #: [id number] DOB: 1956-06-16 Single / Language: Elizabeth Mclean / Race: Black or African American Female   History of Present Illness Elizabeth Mclean Elizabeth Duggar MD; July 15, 2017 12:07 PM) Patient words: NP EVAL for SOB, Leg Swelling, Cardiovascular Disease.  The patient is a 61 year old female, accompanied by her daughter Elizabeth Mclean) during the visit, who presents for shortness of breath. 61 year old African-American female referred for evaluation of shortness of breath and leg edema by [name]  Patient has been having significant bilateral leg swelling and shortness of breath with minimal activity for several weeks. She was [facility] after she was found unresponsive at her friend's place. She does not have recollection of exact sequence of events however she was found to have lost bladder and bowel control and was confused. She tells me that in the ED she was told to have mild pneumonia and elevated liver enzymes, however we do not have the records at this time. She denies any known syncopal episode. She does not have any chest pain but has dyspnea with minimal activity.  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    Additional reasons for visit:  Edema of lower extremities    Problem List/Past Medical Elizabeth Leep, MD; 2017-07-15 11:40 AM) Medical History Reviewed - No Changes  Diabetes Mellitus, Type II  Hypertension  Hepatitis C  Depression  Arthritis  Breast cancer  status post lumpectomy on 08/22/2010 that revealed a low grade DCIS measuring 1.1 cm ER positive PR positive.  Radiation therapy to the left breast from 09/21/2010 to 11/09/2010.  Tamoxifen 20 mg daily in March 2012 completed 01/31/2015.  Allergies Santiago Glad Coughlin; Jul 15, 2017  11:02 AM) Aspirin Adult Low Dose *ANALGESICS - NonNarcotic*  Abdominal pain.  Family History Elizabeth Mclean; Jul 15, 2017 11:09 AM) Mother  Deceased. Age 78;heart failure; diabetics Father  Deceased. Age 67; HTN, Diabeties Siblings  Brother 4  All younger; 1 over weight Sister 5  In stable health. 3 older, 3 younger;1 with heart failure  Social History Elizabeth Mclean; 07-15-17 11:11 AM) Current tobacco use  quit 2011; 1/2 pack daily Non Drinker/No Alcohol Use  Marital status  Single. Living Situation  Lives with roommates. Number of Children  3.  Past Surgical History Elizabeth Mclean; July 15, 2017 11:12 AM) Mastectomy; Subtotal - Left  Left.  Medication History Santiago Glad Coughlin; 07-15-2017 11:25 AM) LevoFLOXacin (500MG  Tablet, 1 Oral every 2 days) Active. Spironolactone (25MG  Tablet, 1 Oral two times daily) Active. RA Vitamin D-3 (5000UNIT Capsule, 1 Oral daily) Active. Xifaxan (550MG  Tablet, 1 Oral two times daily) Active. Lactulose (10GM/15ML Solution, 30 ml Oral three times daily; hold if more then 4 loose bowels daily) Active. Vitamin C (500MG  Tablet, 1 Oral daily) Active. Medications Reconciled (pt had meds)  Diagnostic Studies History Elizabeth Mclean; 2017/07/15 11:14 AM) Abdominal Ultrasound [2018]: Referred for follow up. for liver    Review of Systems [facility] [name]; 2017/07/15 11:52 AM) General Not Present- Appetite Loss and Weight Gain. Respiratory Not Present- Chronic Cough and Wakes up from Sleep Wheezing or Short of Breath. Cardiovascular Present- Difficulty Breathing On Exertion and Edema. Not Present- Chest Pain, Difficulty Breathing Lying Down, Fainting and Paroxysmal Nocturnal Dyspnea. Gastrointestinal Not Present- Black, Tarry Stool and Difficulty Swallowing. Musculoskeletal Not Present- Decreased Range of Motion and Muscle Atrophy. Neurological Not Present- Attention Deficit. Psychiatric Not Present- Personality Changes  and Suicidal  Ideation. Endocrine Not Present- Cold Intolerance and Heat Intolerance. Hematology Not Present- Abnormal Bleeding. All other systems negative  Vitals Santiago Glad Coughlin; 06/17/2017 11:20 AM) 06/17/2017 10:55 AM Weight: 309.44 lb Height: 67in Body Surface Area: 2.43 m Body Mass Index: 48.46 kg/m  Pulse: 88 (Regular)  P.OX: 100% (Room air) BP: 179/81 (Sitting, Left Arm, Standard)       Physical Exam ([name]; 06/17/2017 12:00 PM) General Mental Status-Alert. General Appearance-Cooperative and Appears stated age. Build & Nutrition-Moderately built.  Integumentary General Characteristics Edema is noted and described as - Location - The edema involves the lower extremities bilaterally(3+).  Head and Neck Thyroid Gland Characteristics - normal size and consistency and no palpable nodules. JVP difficult to apprecaite given body habitus  Chest and Lung Exam Chest and lung exam reveals -quiet, even and easy respiratory effort with no use of accessory muscles, non-tender and on auscultation, normal breath sounds, no adventitious sounds.  Cardiovascular Cardiovascular examination reveals -normal heart sounds, regular rate and rhythm with no murmurs, carotid auscultation reveals no bruits, abdominal aorta auscultation reveals no bruits and no prominent pulsation, femoral artery auscultation bilaterally reveals normal pulses, no bruits, no thrills and normal pedal pulses bilaterally. Inspection Jugular vein - Right - Distended. Note: Positive hepatojugular reflux.  Abdomen Palpation/Percussion Palpation and Percussion of the abdomen reveal - Non Tender and No hepatosplenomegaly.  Peripheral Vascular Carotid arteries - Bilateral-No Carotid bruit. Note: unable to feel peripheral pulses due to significant peripheral edema.   Neurologic Neurologic evaluation reveals -alert and oriented x 3 with no impairment of recent or remote  memory. Motor-Grossly intact without any focal deficits.  Musculoskeletal Global Assessment Left Lower Extremity - no deformities, masses or tenderness, no known fractures. Right Lower Extremity - no deformities, masses or tenderness, no known fractures.   Results Elizabeth Mclean Cassidy Tabet MD; 06/17/2017 12:06 PM) Procedures  Name Value Date Complete electrocardiogram (93000) [ EKG ]   Ordered: 06/17/2017 12:06 PM    Assessment: Severe bilateral leg edema.  Hypertension Morbid obesity  Plan: Suspect significant systolic or diastolic dysfunction further exacerbated by hypertension. 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 Also has risk factors for CAD including hypertension and type II diabetes mellitus. Given the extent of her symptoms with NYHA class III dyspnea and severe leg edema, I would ideally like to admit her for IV diuresis and echocardiogram. Also, I will try to obtain records from [place] emergency department in [facility] find out more information about her admission there recently.  Lasix 40 mg q 6 hr. K supplement. Echocardiogram. She is on lactulose and rifaximin, along with spironolactone. I do not know if she has any liver dysfunction noted at her recent ED visit in [place]. Will check lover cuntion.  Cc Juluis Mire, NP  Signed electronically by Elizabeth Leep, MD (06/17/2017 12:07 PM)  Nigel Mormon, MD [facility] Pager: [phone] Office: [phone] If no answer Cell [phone]

## 2017-06-17 NOTE — H&P (Deleted)
[place] 23-Jun-2017 5:56 AM Location: [facility] Patient #: [id number] [phone] DOB: [birth year] Single / Language: Elizabeth Mclean / Race: Black or African American Female   History of Present Illness Elizabeth Mclean Fayette Mclean; 06-23-2017 12:07 PM) Patient words: NP EVAL for SOB, Leg Swelling, Cardiovascular Disease.  The patient is a 61 year old female, accompanied by her daughter Elizabeth Mclean) during the visit, who presents for shortness of breath. 61 year old African-American female referred for evaluation of shortness of breath and leg edema by Elizabeth Mire NP  Patient has been having significant bilateral leg swelling and shortness of breath with minimal activity for several weeks. She was [facility] after she was found unresponsive at her friend's place. She does not have recollection of exact sequence of events however she was found to have lost bladder and bowel control and was confused. She tells me that in the ED she was told to have mild pneumonia and elevated liver enzymes, however we do not have the records at this time. She denies any known syncopal episode. She does not have any chest pain but has dyspnea with minimal activity.  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    Additional reasons for visit:  Edema of lower extremities    Problem List/Past Medical Elizabeth Leep, Mclean; 06/23/2017 11:40 AM) Medical History Reviewed - No Changes  Diabetes Mellitus, Type II  Hypertension  Hepatitis C  Depression  Arthritis  Breast cancer  status post lumpectomy on 08/22/2010 that revealed a low grade DCIS measuring 1.1 cm ER positive PR positive.  Radiation therapy to the left breast from 09/21/2010 to 11/09/2010.  Tamoxifen 20 mg daily in March 2012 completed 01/31/2015.  Allergies Elizabeth Mclean; 06-23-17  11:02 AM) Aspirin Adult Low Dose *ANALGESICS - NonNarcotic*  Abdominal pain.  Family History Elizabeth Mclean; Jun 23, 2017 11:09 AM) Mother  Deceased. Age 31;heart failure; diabetics Father  Deceased. Age 27; HTN, Diabeties Siblings  Brother 4  All younger; 1 over weight Sister 5  In stable health. 3 older, 3 younger;1 with heart failure  Social History Elizabeth Mclean; 23-Jun-2017 11:11 AM) Current tobacco use  quit 2011; 1/2 pack daily Non Drinker/No Alcohol Use  Marital status  Single. Living Situation  Lives with roommates. Number of Children  3.  Past Surgical History Elizabeth Mclean; 06-23-17 11:12 AM) Mastectomy; Subtotal - Left  Left.  Medication History Elizabeth Mclean; 06-23-17 11:25 AM) LevoFLOXacin (500MG  Tablet, 1 Oral every 2 days) Active. Spironolactone (25MG  Tablet, 1 Oral two times daily) Active. RA Vitamin D-3 (5000UNIT Capsule, 1 Oral daily) Active. Xifaxan (550MG  Tablet, 1 Oral two times daily) Active. Lactulose (10GM/15ML Solution, 30 ml Oral three times daily; hold if more then 4 loose bowels daily) Active. Vitamin C (500MG  Tablet, 1 Oral daily) Active. Medications Reconciled (pt had meds)  Diagnostic Studies History Elizabeth Mclean; Jun 23, 2017 11:14 AM) Abdominal Ultrasound [2018]: Referred for follow up. for liver    Review of Systems [facility] Elizabeth Mclean; 23-Jun-2017 11:52 AM) General Not Present- Appetite Loss and Weight Gain. Respiratory Not Present- Chronic Cough and Wakes up from Sleep Wheezing or Short of Breath. Cardiovascular Present- Difficulty Breathing On Exertion and Edema. Not Present- Chest Pain, Difficulty Breathing Lying Down, Fainting and Paroxysmal Nocturnal Dyspnea. Gastrointestinal Not Present- Black, Tarry Stool and Difficulty Swallowing. Musculoskeletal Not Present- Decreased Range of Motion and Muscle Atrophy. Neurological Not Present- Attention Deficit. Psychiatric Not Present- Personality Changes  and Suicidal  Ideation. Endocrine Not Present- Cold Intolerance and Heat Intolerance. Hematology Not Present- Abnormal Bleeding. All other systems negative  Vitals Elizabeth Mclean; 06/17/2017 11:20 AM) 06/17/2017 10:55 AM Weight: 309.44 lb Height: 67in Body Surface Area: 2.43 m Body Mass Index: 48.46 kg/m  Pulse: 88 (Regular)  P.OX: 100% (Room air) BP: 179/81 (Sitting, Left Arm, Standard)       Physical Exam (Elizabeth Mclean; 06/17/2017 12:00 PM) General Mental Status-Alert. General Appearance-Cooperative and Appears stated age. Build & Nutrition-Moderately built.  Integumentary General Characteristics Edema is noted and described as - Location - The edema involves the lower extremities bilaterally(3+).  Head and Neck Thyroid Gland Characteristics - normal size and consistency and no palpable nodules. JVP difficult to apprecaite given body habitus  Chest and Lung Exam Chest and lung exam reveals -quiet, even and easy respiratory effort with no use of accessory muscles, non-tender and on auscultation, normal breath sounds, no adventitious sounds.  Cardiovascular Cardiovascular examination reveals -normal heart sounds, regular rate and rhythm with no murmurs, carotid auscultation reveals no bruits, abdominal aorta auscultation reveals no bruits and no prominent pulsation, femoral artery auscultation bilaterally reveals normal pulses, no bruits, no thrills and normal pedal pulses bilaterally. Inspection Jugular vein - Right - Distended. Note: Positive hepatojugular reflux.  Abdomen Palpation/Percussion Palpation and Percussion of the abdomen reveal - Non Tender and No hepatosplenomegaly.  Peripheral Vascular Carotid arteries - Bilateral-No Carotid bruit. Note: unable to feel peripheral pulses due to significant peripheral edema.   Neurologic Neurologic evaluation reveals -alert and oriented x 3 with no impairment of recent or remote  memory. Motor-Grossly intact without any focal deficits.  Musculoskeletal Global Assessment Left Lower Extremity - no deformities, masses or tenderness, no known fractures. Right Lower Extremity - no deformities, masses or tenderness, no known fractures.   Results Elizabeth Gaskins Jamine Wingate Mclean; 06/17/2017 12:06 PM) Procedures  Name Value Date Complete electrocardiogram (93000) [ EKG ]   Ordered: 06/17/2017 12:06 PM    Assessment: Severe bilateral leg edema.  Hypertension Morbid obesity  Plan: Suspect significant systolic or diastolic dysfunction further exacerbated by hypertension. 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 Also has risk factors for CAD including hypertension and type II diabetes mellitus. Given the extent of her symptoms with NYHA class III dyspnea and severe leg edema, I would ideally like to admit her for IV diuresis and echocardiogram. Also, I will try to obtain records from [place] emergency department in [facility] find out more information about her admission there recently.  Lasix 40 mg q 6 hr. K supplement. Echocardiogram. She is on lactulose and rifaximin, along with spironolactone. I do not know if she has any liver dysfunction noted at her recent ED visit in [place]. Will check lover cuntion.  Cc Elizabeth Mire, NP  Signed electronically by Elizabeth Leep, Mclean (06/17/2017 12:07 PM)  Nigel Mormon, Mclean [facility] Pager: [phone] Office: [phone] If no answer Cell [phone]

## 2017-06-18 ENCOUNTER — Inpatient Hospital Stay (HOSPITAL_COMMUNITY): Payer: Medicaid Other

## 2017-06-18 LAB — BASIC METABOLIC PANEL
ANION GAP: 6 (ref 5–15)
BUN: 16 mg/dL (ref 6–20)
CALCIUM: 8 mg/dL — AB (ref 8.9–10.3)
CHLORIDE: 109 mmol/L (ref 101–111)
CO2: 20 mmol/L — AB (ref 22–32)
CREATININE: 1.11 mg/dL — AB (ref 0.44–1.00)
GFR calc non Af Amer: 52 mL/min — ABNORMAL LOW (ref >60)
GLUCOSE: 87 mg/dL (ref 65–99)
POTASSIUM: 4.2 mmol/L (ref 3.5–5.1)
SODIUM: 135 mmol/L (ref 135–145)

## 2017-06-18 LAB — HIV ANTIBODY (ROUTINE TESTING W REFLEX): HIV Screen 4th Generation wRfx: NONREACTIVE

## 2017-06-18 LAB — MAGNESIUM: Magnesium: 1.4 mg/dL — ABNORMAL LOW (ref 1.7–2.4)

## 2017-06-18 LAB — PLATELET COUNT: Platelets: UNDETERMINED 10*3/uL (ref 150–400)

## 2017-06-18 MED ORDER — LACTULOSE 10 GM/15ML PO SOLN
20.0000 g | Freq: Three times a day (TID) | ORAL | Status: DC
  Administered 2017-06-18 – 2017-06-21 (×7): 20 g via ORAL
  Filled 2017-06-18 (×8): qty 30

## 2017-06-18 NOTE — Progress Notes (Addendum)
Subjective:  Feels better. Leg swelling improved.  Objective:  Vital Signs in the last 24 hours: Temp:  [98.7 F (37.1 C)-99.5 F (37.5 C)] 98.7 F (37.1 C) (10/02 0413) Pulse Rate:  [73-91] 73 (10/02 0413) Resp:  [16-18] 16 (10/02 0413) BP: (126-152)/(56-61) 126/60 (10/02 0413) SpO2:  [99 %-100 %] 99 % (10/02 0413) Weight:  [138.3 kg (304 lb 12.8 oz)-139.4 kg (307 lb 4.8 oz)] 138.3 kg (304 lb 12.8 oz) (10/02 0413)   Liver enzymes elevated. Synthetic function reduced 2.4 L urine outout since admission  Intake/Output from previous day: 10/01 0701 - 10/02 0700 In: -  Out: 1750 [Urine:1750] Intake/Output from this shift: Total I/O In: -  Out: 700 [Urine:700]  Physical Exam: Nursing note and vitals reviewed. Constitutional: She appears well-developed.  Morbidly pulse  HENT:  Head: Normocephalic and atraumatic.  Neck: JVD present.  Cardiovascular: Normal rate and regular rhythm.   Murmur (II/VI holosystolic murmur LLSB) heard. Respiratory: Effort normal and breath sounds normal. She has no wheezes. She has no rales.  GI: Soft. She exhibits distension. There is no tenderness. There is no guarding.  Musculoskeletal: Normal range of motion. She exhibits edema (3+ b/l).  Neurological: She is alert.  Skin: Skin is warm and dry.   Lab Results:  Recent Labs  06/17/17 2002  WBC 7.2  HGB 10.1*  PLT PLATELET CLUMPS NOTED ON SMEAR, COUNT APPEARS DECREASED    Recent Labs  06/17/17 2002 06/18/17 0525  NA 135 135  K 4.1 4.2  CL 106 109  CO2 22 20*  GLUCOSE 92 87  BUN 14 16  CREATININE 1.03* 1.11*   No results for input(s): TROPONINI in the last 72 hours.  Invalid input(s): CK, MB Hepatic Function Panel  Recent Labs  06/17/17 2002  PROT 6.8  ALBUMIN 2.1*  AST 159*  ALT 86*  ALKPHOS 101  BILITOT 3.4*   Imaging: Echocardiogram, liver ultrasound pending   Cardiac Studies:  Assessment: Severe bilateral leg edema.  Liver dysfunction: Elevated AST/ALT,  albumin 2.1, INR 1.8: This could either indicate primary liver dysfunction or passive congestion systolic/diastolic dysfunction, predominantly right.  Malnutrition H/p Hep C Hypertension H/o breast cancer, s/p left lumpectomy, radiation + tamoxifen 2011. Will need to rule out that her liver dysfunction is not due metastasis  Morbid obesity  Her episode of confusion in [place] could well have been that of hepatic encephalopathy. Awaiting records    Plan:  Continue VI diuresis with lasix 40 mg IV q6hr. K supplement Awaiting echocardiogram, liver ultrasound. Will consult GI after this. Continue spironolactone. Continue coreg, losartan, spironolactone Continue lactulose, rifaxmin for presumed liver cirrhosis   LOS: 1 day    Milano Rosevear J Syble Picco 06/18/2017, 7:29 AM  Lorielle Boehning Esther Hardy, MD [facility] Pager: [phone] Office: [phone] If no answer Cell [phone]

## 2017-06-18 NOTE — Care Management Note (Signed)
Case Management Note  Patient Details  Name: Elizabeth Mclean MRN: [id number] Date of Birth: [birth year]  Subjective/Objective:    CHF             Action/Plan: Patient lives at home; has private insurance with Medicaid with prescription drug coverage; CM following for DCP  Expected Discharge Date:  06/19/17               Expected Discharge Plan:  Home/Self Care  In-House Referral:   [facility]  Discharge planning Services  CM Consult   Status of Service:  In process, will continue to follow  Carrie Mew [phone] 06/18/2017, 2:35 PM

## 2017-06-18 NOTE — Progress Notes (Signed)
Ultrasound called and ask if pt has been NPO, RN stated pt was not NPO over night. Technition stated to keep pt NPO for 6 hours starting at 8 am  Pt aware, sign placed on door

## 2017-06-18 NOTE — Progress Notes (Signed)
Printed education on rifaximin Provided to pt
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
## 2017-06-19 ENCOUNTER — Inpatient Hospital Stay (HOSPITAL_COMMUNITY): Payer: Medicaid Other

## 2017-06-19 LAB — BASIC METABOLIC PANEL
ANION GAP: 6 (ref 5–15)
BUN: 22 mg/dL — ABNORMAL HIGH (ref 6–20)
CALCIUM: 7.9 mg/dL — AB (ref 8.9–10.3)
CHLORIDE: 110 mmol/L (ref 101–111)
CO2: 21 mmol/L — AB (ref 22–32)
Creatinine, Ser: 1.45 mg/dL — ABNORMAL HIGH (ref 0.44–1.00)
GFR calc non Af Amer: 38 mL/min — ABNORMAL LOW (ref >60)
GFR, EST AFRICAN AMERICAN: 44 mL/min — AB (ref >60)
Glucose, Bld: 107 mg/dL — ABNORMAL HIGH (ref 65–99)
Potassium: 4.3 mmol/L (ref 3.5–5.1)
Sodium: 137 mmol/L (ref 135–145)

## 2017-06-19 LAB — CBC
HCT: 24.8 % — ABNORMAL LOW (ref 36.0–46.0)
HEMOGLOBIN: 8.9 g/dL — AB (ref 12.0–15.0)
MCH: 33.8 pg (ref 26.0–34.0)
MCHC: 35.9 g/dL (ref 30.0–36.0)
MCV: 94.3 fL (ref 78.0–100.0)
Platelets: 89 10*3/uL — ABNORMAL LOW (ref 150–400)
RBC: 2.63 MIL/uL — AB (ref 3.87–5.11)
RDW: 17.9 % — ABNORMAL HIGH (ref 11.5–15.5)
WBC: 6.6 10*3/uL (ref 4.0–10.5)

## 2017-06-19 LAB — PROTIME-INR
INR: 2.02
PROTHROMBIN TIME: 22.7 s — AB (ref 11.4–15.2)

## 2017-06-19 LAB — ECHOCARDIOGRAM COMPLETE
Height: 67 in
Weight: 4790.4 oz

## 2017-06-19 MED ORDER — FUROSEMIDE 10 MG/ML IJ SOLN: 40.0000 mg | Freq: Two times a day (BID) | INTRAMUSCULAR | Status: DC

## 2017-06-19 MED ORDER — FUROSEMIDE 40 MG PO TABS
40.0000 mg | ORAL_TABLET | Freq: Every day | ORAL | Status: DC
  Administered 2017-06-19 – 2017-06-20 (×2): 40 mg via ORAL
  Filled 2017-06-19 (×2): qty 1

## 2017-06-19 MED ORDER — SPIRONOLACTONE 25 MG PO TABS
100.0000 mg | ORAL_TABLET | Freq: Every day | ORAL | Status: DC
  Administered 2017-06-20 – 2017-06-21 (×2): 100 mg via ORAL
  Filled 2017-06-19 (×2): qty 4

## 2017-06-19 NOTE — Progress Notes (Signed)
Notified Dr about patient's BP's being soft. He is discontinuing one of her medications. Patient is asymptomatic. Will continue to monitor.

## 2017-06-19 NOTE — Progress Notes (Addendum)
Subjective:  Feels better. Leg swelling improved.  Objective:  Vital Signs in the last 24 hours: Temp:  [98.1 F (36.7 C)-98.4 F (36.9 C)] 98.4 F (36.9 C) (10/03 0650) Pulse Rate:  [73-85] 77 (10/03 0650) Resp:  [16-18] 18 (10/03 0650) BP: (100-115)/(35-58) 100/35 (10/03 0650) SpO2:  [100 %] 100 % (10/03 0650) Weight:  [135.8 kg (299 lb 6.4 oz)] 135.8 kg (299 lb 6.4 oz) (10/03 0650)   Liver enzymes elevated. Synthetic function reduced 2.6 L net negative Cr increased to 1.4  Intake/Output from previous day: 10/02 0701 - 10/03 0700 In: 360 [P.O.:360] Out: 1300 [Urine:1300] Intake/Output from this shift: No intake/output data recorded.  Physical Exam: Nursing note and vitals reviewed. Constitutional: She appears well-developed.  Morbidly pulse  HENT:  Head: Normocephalic and atraumatic.  Neck: JVD present.  Cardiovascular: Normal rate and regular rhythm.   Murmur (II/VI holosystolic murmur LLSB) heard. Respiratory: Effort normal and breath sounds normal. She has no wheezes. She has no rales.  GI: Soft. She exhibits distension. Ascites present. There is no tenderness. There is no guarding.  Musculoskeletal: Normal range of motion. She exhibits edema (3+ b/l).  Neurological: She is alert.  Skin: Skin is warm and dry.   Lab Results:  Recent Labs  06/17/17 2002 06/18/17 0624  WBC 7.2  --   HGB 10.1*  --   PLT PLATELET CLUMPS NOTED ON SMEAR, COUNT APPEARS DECREASED PLATELET CLUMPS NOTED ON SMEAR, UNABLE TO ESTIMATE    Recent Labs  06/18/17 0525 06/19/17 0335  NA 135 137  K 4.2 4.3  CL 109 110  CO2 20* 21*  GLUCOSE 87 107*  BUN 16 22*  CREATININE 1.11* 1.45*   No results for input(s): TROPONINI in the last 72 hours.  Invalid input(s): CK, MB Hepatic Function Panel  Recent Labs  06/17/17 2002  PROT 6.8  ALBUMIN 2.1*  AST 159*  ALT 86*  ALKPHOS 101  BILITOT 3.4*   Imaging: Echocardiogram  Liver ultrasound: IMPRESSION: 1. Small, cirrhotic  liver. 2. Evidence of portal hypertension with sluggish and at least partially reversed flow in the portal veins as well as moderate volume ascites. 3. The main portal vein remains patent. The relatively small right and left portal veins are not well seen.   Cardiac Studies: Clinic electrocardiogram 06/17/2017: NSR 79 bpm. Normal conduction. Normal axis. Poor R wave progression. Nonspecific ST-T changes lead III  Echocardiogram 06/19/2017: Study Conclusions  - Left ventricle: Wall thickness was increased in a pattern of mild   LVH. Systolic function was vigorous. The estimated ejection   fraction was in the range of 65% to 70%. Wall motion was normal;   there were no regional wall motion abnormalities. Indeterminate   diastolic function based on Doppler data. - Right ventricle: The cavity size was mildly dilated. - Aortic valve: Valve area (VTI): 2.42 cm^2. Valve area (Vmax):   2.27 cm^2. Valve area (Vmean): 2.56 cm^2. - Pulmonary arteries: PA peak pressure: 34 mm Hg (S). - Pericardium, extracardiac: There was a left pleural effusion.  Impressions:  - Preserved RV and LV systolic function. Indeterminate diastolic   function. Trace TR. Mildly elevated central venous pressure and   PA systolic pressure. Possible left pleural effusion.  Assessment: Severe bilateral leg edema.     Echocardiogram does not show any significant reason for anasarca and liver cirrhosis. Liver cirrhosis.  Moderate malnutrition H/p Hep C Acute kidney injury Hypertension H/o breast cancer, s/p left lumpectomy, radiation + tamoxifen 2011. Will need to rule  out that her liver dysfunction is not due metastasis  Plan:  Reduce IV diuresis to lasix 40 mg PO q12hr in light of increased creatinine. K supplement Consulted GI today for management of cirrhosis. Continue spironolactone. Continue coreg, losartan, spironolactone Continue lactulose, rifaxmin   LOS: 2 days    Cortni Tays J Maurilio Puryear 06/19/2017,  8:18 AM  Malanie Koloski Esther Hardy, MD [facility] Pager: [phone] Office: [phone] If no answer Cell [phone]

## 2017-06-19 NOTE — Consult Note (Signed)
Reason for Consult: Cirrhosis and HCV Referring Physician: Dr. Beverlee Nims HPI: This is a 61 year old female for SOB and bilateral leg swelling.  Initially it was thought that she had a cardiac etiology, but she was then identified to have cirrhosis.  The doppler ultrasound reveals hepatopetal flow, which is consistent with portal HTN.  Her liver enzymes were also noted not be abnormal and she was told that she had HCV.  This diagnosis came about during an admission to the ER in [place], [place] when she was found to be unresponsive with loss of bowel and bladder control.  She was last evaluated in the office in for a routine screening colonoscopy.  The procedure on 11/27/2011 was significant for a small descending colon tubular adenoma.    Past Medical History:  Diagnosis Date  . Allergy   . Arthritis    hips, shoulders, face  . Bilateral swelling of feet   . Bone spur of foot   . Cancer [facility]) 2011   Left Breast cancer  . Chronic pain   . Depression   . Diabetes mellitus without complication ([place])   . GERD (gastroesophageal reflux disease)   . Hepatitis C   . Hypertension   . Insomnia   . Leg cramps     Past Surgical History:  Procedure Laterality Date  . BREAST SURGERY    . COLONOSCOPY    . epidural steroid injection    . HYSTEROSCOPY W/D&C N/A 05/15/2016   Procedure: DILATATION AND CURETTAGE /HYSTEROSCOPY;  Surgeon: Lavonia Drafts, MD;  Location: [facility];  Service: Gynecology;  Laterality: N/A;  . MOUTH SURGERY    . THUMB SURGERY      Family History  Problem Relation Age of Onset  . Diabetes Mother   . Diabetes Father   . Hypertension Other     Social History:  reports that she has quit smoking. She has never used smokeless tobacco. She reports that she does not drink alcohol or use drugs.  Allergies:  Allergies  Allergen Reactions  . Aspirin Other (See Comments)    Stomach aches   . Fexofenadine Other (See Comments)    REACTION: Leg and  back pain  . Hydrocodone Itching    Medications:  Scheduled: . carvedilol  3.125 mg Oral BID WC  . furosemide  40 mg Intravenous Q12H  . heparin  5,000 Units Subcutaneous Q8H  . lactulose  20 g Oral TID  . losartan  50 mg Oral Daily  . potassium chloride SA  20 mEq Oral Daily  . rifaximin  550 mg Oral BID  . sodium chloride flush  3 mL Intravenous Q12H  . spironolactone  25 mg Oral BID   Continuous: . sodium chloride      Results for orders placed or performed during the hospital encounter of 06/17/17 (from the past 24 hour(s))  Basic metabolic panel     Status: Abnormal   Collection Time: 06/19/17  3:35 AM  Result Value Ref Range   Sodium 137 135 - 145 mmol/L   Potassium 4.3 3.5 - 5.1 mmol/L   Chloride 110 101 - 111 mmol/L   CO2 21 (L) 22 - 32 mmol/L   Glucose, Bld 107 (H) 65 - 99 mg/dL   BUN 22 (H) 6 - 20 mg/dL   Creatinine, Ser 1.45 (H) 0.44 - 1.00 mg/dL   Calcium 7.9 (L) 8.9 - 10.3 mg/dL   GFR calc non Af Amer 38 (L) >60 mL/min  GFR calc Af Amer 44 (L) >60 mL/min   Anion gap 6 5 - 15  CBC     Status: Abnormal   Collection Time: 06/19/17  8:00 AM  Result Value Ref Range   WBC 6.6 4.0 - 10.5 K/uL   RBC 2.63 (L) 3.87 - 5.11 MIL/uL   Hemoglobin 8.9 (L) 12.0 - 15.0 g/dL   HCT 24.8 (L) 36.0 - 46.0 %   MCV 94.3 78.0 - 100.0 fL   MCH 33.8 26.0 - 34.0 pg   MCHC 35.9 30.0 - 36.0 g/dL   RDW 17.9 (H) 11.5 - 15.5 %   Platelets 89 (L) 150 - 400 K/uL  Protime-INR     Status: Abnormal   Collection Time: 06/19/17  8:00 AM  Result Value Ref Range   Prothrombin Time 22.7 (H) 11.4 - 15.2 seconds   INR 2.02      US Liver Doppler  Result Date: 06/18/2017 CLINICAL DATA:  61 year old female with hepatic cirrhosis EXAM: DUPLEX ULTRASOUND OF LIVER TECHNIQUE: Color and duplex Doppler ultrasound was performed to evaluate the hepatic in-flow and out-flow vessels. COMPARISON:  None. FINDINGS: Portal Vein Velocities Main:  11-17 cm/sec without normal hepatofugal flow. Right:  Not well  seen Left:  Not well seen Hepatic Vein Velocities Right:  46 cm/sec Middle:  21 cm/sec Left:  15 cm/sec Hepatic Artery Velocity:  84 cm/sec Splenic Vein Velocity:  19 cm/sec Varices: None visualized Ascites: Moderate volume perihepatic ascites. The liver is small, heterogeneous, echogenic and nodular. IMPRESSION: 1. Small, cirrhotic liver. 2. Evidence of portal hypertension with sluggish and at least partially reversed flow in the portal veins as well as moderate volume ascites. 3. The main portal vein remains patent. The relatively small right and left portal veins are not well seen. Signed, Criselda Peaches, MD [facility] Electronically Signed   By: Jacqulynn Cadet M.D.   On: 06/18/2017 14:06    ROS:  As stated above in the HPI otherwise negative.  Blood pressure (!) 100/35, pulse 77, temperature 98.4 F (36.9 C), temperature source Oral, resp. rate 18, height 5\' 7"  (1.702 m), weight 135.8 kg (299 lb 6.4 oz), last menstrual period 02/22/2016, SpO2 100 %.    PE: Gen: NAD, Alert and Oriented HEENT:  [place]/AT, EOMI Neck: Supple, no LAD Lungs: CTA Bilaterally CV: RRR without M/G/R ABM: Soft, obese, difficult to assess for ascites, +BS Ext: No C/C, 4+ edema  Assessment/Plan: 1) HCV cirrhosis - Decompensated 2) Ascites. 3) Distant history of cocaine abuse. 4) Thrombocytopenia.   An ultrasound performed on 04/11/2012 for acute HCV was only revealing for hepatic steatosis.  There was no overt findings of cirrhosis at that time and she was not thrombocytopenic.  There is a history of cocaine abuse, but it was distant.  It will take time for her fluid to mobilized and she will require treatment for her HCV.  She does not drink any ETOH.    Plan: 1) Step I diuretics - Lasix 40 mg QD and Spironolactone 100 mg QD. 2) 2 gram sodium diet. 3) Screening EGD for varices at some point in the near future. 4) Check HCV genotype and RNA level. 5)  Patient will require treatment for her HCV, as an outpatient, once her hepatic decompensation resolves. 6) She can continue with rifaximin and lactulose.  Malick Netz D 06/19/2017, 9:28 AM

## 2017-06-19 NOTE — Plan of Care (Signed)
Problem: Food- and Nutrition-Related Knowledge Deficit (NB-1.1) Goal: Nutrition education Formal process to instruct or train a patient/client in a skill or to impart knowledge to help patients/clients voluntarily manage or modify food choices and eating behavior to maintain or improve health. Outcome: Completed/Met Date Met: 06/19/17 Nutrition Education Note  RD consulted for nutrition diet education.  RD provided "Low Sodium Nutrition Therapy" handout from the Academy of [employer] and Dietetics. Reviewed patient's dietary recall. Provided examples on ways to decrease sodium intake in diet. Discouraged intake of processed foods and use of salt shaker. Encouraged fresh fruits and vegetables as well as whole grain sources of carbohydrates to maximize fiber intake.   RD discussed why it is important for patient to adhere to diet recommendations and foods to avoid. Teach back method used. Pt reports regularly attending appointments with an outpatient RD and has already made numerous dietary changes which pt is happy about.   Expect good compliance.  Body mass index is 46.89 kg/m. Pt meets criteria for morbid obesity based on current BMI.  Current diet order is 2 gram sodium diet, patient is consuming mostly 75-100% of meals at this time. Labs and medications reviewed. No further nutrition interventions warranted at this time. RD contact information provided. If additional nutrition issues arise, please re-consult RD.   Corrin Parker, MS, RD, LDN Pager # [phone] After hours/ weekend pager # [phone]

## 2017-06-19 NOTE — Progress Notes (Signed)
  Echocardiogram 2D Echocardiogram has been performed.  Elizabeth Mclean 06/19/2017, 9:05 AM

## 2017-06-20 LAB — BASIC METABOLIC PANEL
Anion gap: 4 — ABNORMAL LOW (ref 5–15)
BUN: 28 mg/dL — AB (ref 6–20)
CHLORIDE: 110 mmol/L (ref 101–111)
CO2: 22 mmol/L (ref 22–32)
Calcium: 7.9 mg/dL — ABNORMAL LOW (ref 8.9–10.3)
Creatinine, Ser: 1.65 mg/dL — ABNORMAL HIGH (ref 0.44–1.00)
GFR calc Af Amer: 38 mL/min — ABNORMAL LOW (ref >60)
GFR calc non Af Amer: 33 mL/min — ABNORMAL LOW (ref >60)
Glucose, Bld: 97 mg/dL (ref 65–99)
Potassium: 4.7 mmol/L (ref 3.5–5.1)
Sodium: 136 mmol/L (ref 135–145)

## 2017-06-20 MED ORDER — TRAMADOL HCL 50 MG PO TABS
50.0000 mg | ORAL_TABLET | Freq: Four times a day (QID) | ORAL | Status: DC | PRN
  Administered 2017-06-20: 50 mg via ORAL
  Filled 2017-06-20: qty 1

## 2017-06-20 NOTE — Progress Notes (Signed)
Subjective: No complaints.  Feeling better.  Objective: Vital signs in last 24 hours: Temp:  [98.3 F (36.8 C)-98.9 F (37.2 C)] 98.9 F (37.2 C) (10/04 0601) Pulse Rate:  [70-73] 73 (10/04 1256) Resp:  [18-20] 18 (10/04 1256) BP: (108-128)/(42-67) 128/67 (10/04 1256) SpO2:  [95 %-98 %] 95 % (10/04 1256) Weight:  [135.7 kg (299 lb 1.6 oz)] 135.7 kg (299 lb 1.6 oz) (10/04 0601) Last BM Date: 06/19/17  Intake/Output from previous day: 10/03 0701 - 10/04 0700 In: 823 [P.O.:820; I.V.:3] Out: 1050 [Urine:1050] Intake/Output this shift: Total I/O In: 600 [P.O.:600] Out: -   General appearance: alert and no distress GI: obese, soft, unable to truly assess for ascites  Lab Results:  Recent Labs  06/17/17 2002 06/18/17 0624 06/19/17 0800  WBC 7.2  --  6.6  HGB 10.1*  --  8.9*  HCT 28.4*  --  24.8*  PLT PLATELET CLUMPS NOTED ON SMEAR, COUNT APPEARS DECREASED PLATELET CLUMPS NOTED ON SMEAR, UNABLE TO ESTIMATE 89*   BMET  Recent Labs  06/18/17 0525 06/19/17 0335 06/20/17 0704  NA 135 137 136  K 4.2 4.3 4.7  CL 109 110 110  CO2 20* 21* 22  GLUCOSE 87 107* 97  BUN 16 22* 28*  CREATININE 1.11* 1.45* 1.65*  CALCIUM 8.0* 7.9* 7.9*   LFT  Recent Labs  06/17/17 2002  PROT 6.8  ALBUMIN 2.1*  AST 159*  ALT 86*  ALKPHOS 101  BILITOT 3.4*   PT/INR  Recent Labs  06/17/17 2002 06/19/17 0800  LABPROT 21.4* 22.7*  INR 1.87 2.02   Hepatitis Panel No results for input(s): HEPBSAG, HCVAB, HEPAIGM, HEPBIGM in the last 72 hours. C-Diff No results for input(s): CDIFFTOX in the last 72 hours. Fecal Lactopherrin No results for input(s): FECLLACTOFRN in the last 72 hours.  Studies/Results: No results found.  Medications:  Scheduled: . heparin  5,000 Units Subcutaneous Q8H  . lactulose  20 g Oral TID  . rifaximin  550 mg Oral BID  . sodium chloride flush  3 mL Intravenous Q12H  . spironolactone  100 mg Oral Daily   Continuous: . sodium chloride       Assessment/Plan: 1) Decompensated cirrhosis. 2) Ascites.  3) Renal insufficiency.   She is better.  Her weight has mildly declined from yesterday.  Her lasix was decreased to QD, but her creatinine continued to climb.  She has not received her PM dose today.  With her increase increase in creatinine I will hold her lasix.  She can maintain spironolactone.  The patient understands that the fluid mobilization will be a slow process.  Plan: 1) Hold lasix. 2) Recheck weight in the AM. 3) Maintain low sodium diet.   LOS: 3 days   Jasten Guyette D 06/20/2017, 5:02 PM

## 2017-06-20 NOTE — Progress Notes (Signed)
Subjective:  Feels better. Leg swelling improved. Seen by GI.  Objective:  Vital Signs in the last 24 hours: Temp:  [98.3 F (36.8 C)-98.9 F (37.2 C)] 98.9 F (37.2 C) (10/04 0601) Pulse Rate:  [70-74] 73 (10/04 0601) Resp:  [20] 20 (10/04 0601) BP: (98-120)/(39-60) 120/50 (10/04 0601) SpO2:  [98 %-100 %] 98 % (10/04 0601) Weight:  [135.7 kg (299 lb 1.6 oz)] 135.7 kg (299 lb 1.6 oz) (10/04 0601)   Liver enzymes elevated. Synthetic function reduced 2.9 L net negative Cr increased to 1.65  Intake/Output from previous day: 10/03 0701 - 10/04 0700 In: 823 [P.O.:820; I.V.:3] Out: 1050 [Urine:1050] Intake/Output from this shift: No intake/output data recorded.  Physical Exam: Nursing note and vitals reviewed. Constitutional: She appears well-developed.  Morbidly pulse  HENT:  Head: Normocephalic and atraumatic.  Neck: No JVD .  Cardiovascular: Normal rate and regular rhythm.   Murmur (II/VI holosystolic murmur LLSB) heard. Respiratory: Effort normal and breath sounds normal. She has no wheezes. She has no rales.  GI: Soft. She exhibits distension. Ascites improved. There is no tenderness. There is no guarding.  Musculoskeletal: Normal range of motion. She exhibits edema (3+ b/l).  Neurological: She is alert.  Skin: Skin is warm and dry.   Lab Results:  Recent Labs  06/17/17 2002 06/18/17 0624 06/19/17 0800  WBC 7.2  --  6.6  HGB 10.1*  --  8.9*  PLT PLATELET CLUMPS NOTED ON SMEAR, COUNT APPEARS DECREASED PLATELET CLUMPS NOTED ON SMEAR, UNABLE TO ESTIMATE 89*    Recent Labs  06/19/17 0335 06/20/17 0704  NA 137 136  K 4.3 4.7  CL 110 110  CO2 21* 22  GLUCOSE 107* 97  BUN 22* 28*  CREATININE 1.45* 1.65*   No results for input(s): TROPONINI in the last 72 hours.  Invalid input(s): CK, MB Hepatic Function Panel  Recent Labs  06/17/17 2002  PROT 6.8  ALBUMIN 2.1*  AST 159*  ALT 86*  ALKPHOS 101  BILITOT 3.4*   Imaging: Echocardiogram  Liver  ultrasound: IMPRESSION: 1. Small, cirrhotic liver. 2. Evidence of portal hypertension with sluggish and at least partially reversed flow in the portal veins as well as moderate volume ascites. 3. The main portal vein remains patent. The relatively small right and left portal veins are not well seen.   Cardiac Studies: Clinic electrocardiogram 06/17/2017: NSR 79 bpm. Normal conduction. Normal axis. Poor R wave progression. Nonspecific ST-T changes lead III  Echocardiogram 06/19/2017: Study Conclusions  - Left ventricle: Wall thickness was increased in a pattern of mild   LVH. Systolic function was vigorous. The estimated ejection   fraction was in the range of 65% to 70%. Wall motion was normal;   there were no regional wall motion abnormalities. Indeterminate   diastolic function based on Doppler data. - Right ventricle: The cavity size was mildly dilated. - Aortic valve: Valve area (VTI): 2.42 cm^2. Valve area (Vmax):   2.27 cm^2. Valve area (Vmean): 2.56 cm^2. - Pulmonary arteries: PA peak pressure: 34 mm Hg (S). - Pericardium, extracardiac: There was a left pleural effusion.  Impressions:  - Preserved RV and LV systolic function. Indeterminate diastolic   function. Trace TR. Mildly elevated central venous pressure and   PA systolic pressure. Possible left pleural effusion.  Assessment: Liver cirrhosis, likely due to Hep C Moderate malnutrition H/o Hep C Acute kidney injury, likely secondary to aggressive diuresis Anemia, normocytic. No melena Thrombocytopenia, stable. Likely secondary to cirrhosis Hypertension, controlled H/o breast cancer,  s/p left lumpectomy, radiation + tamoxifen 2011.  Appreciate GI consult  Plan:  Continue spironolactone 100 mg and lasix 40 mg PO daily Carvedilol, losartan stopped Continue lactulose, rifaxmin Ambulate today with physical therapy. If Cr improved, possibly discharge home 06/21/17 w/close follow up with Dr. Benson Norway, GI. Will need  outpatient endoscopy.   LOS: 3 days    Elizabeth Mclean 06/20/2017, 8:47 AM  [place], MD [facility] Pager: [phone] Office: [phone] If no answer Cell [phone]

## 2017-06-20 NOTE — Evaluation (Signed)
Physical Therapy Evaluation Patient Details Name: Elizabeth Mclean MRN: [id number] DOB: 1956-06-22 Today's Date: 06/20/2017   History of Present Illness  Patient is a 61 y/o female who presents with SOB and BLE swelling. Admitted with significant systolic or diastolic CHF further exacerbated by hypertension. PMH includes HTN, Hep C, DM, ca, depression, bone spur on foot.  Clinical Impression  Patient presents close to functional baseline and able to ambulate community distances with supervision for safety using SPC. Pt lives with friend but is home alone most of the time and uses [facility] for community ambulation. Recommend BSC, shower chair and life alert as pt has hx of falls and is home alone a lot. Pt with 2/4 DOE during activity. Education AS:NKNLZJ conservation techniques and to continue aerobic therapy. Pt does not require skilled therapy services. Discharge from therapy.    Follow Up Recommendations No PT follow up;Supervision for mobility/OOB    Equipment Recommendations  3in1 (PT) (shower chair; life alert)    Recommendations for Other Services OT consult     Precautions / Restrictions Precautions Precautions: None Restrictions Weight Bearing Restrictions: No      Mobility  Bed Mobility               General bed mobility comments: Sitting in chair upon PT arrival.   Transfers Overall transfer level: Modified independent Equipment used: Straight cane             General transfer comment: Stood from chair x1 without difficulty using SPC for support.   Ambulation/Gait Ambulation/Gait assistance: Supervision Ambulation Distance (Feet): 200 Feet Assistive device: Straight cane Gait Pattern/deviations: Step-to pattern;Step-through pattern;Decreased stride length;Trunk flexed Gait velocity: decreased Gait velocity interpretation: Below normal speed for age/gender General Gait Details: Slow, mostly steady gait using SPC for support. 2-3/4 DOE. HR stable in  80s-90s bpm.  Stairs            Wheelchair Mobility    Modified Rankin (Stroke Patients Only)       Balance Overall balance assessment: Needs assistance;History of Falls Sitting-balance support: Feet supported;No upper extremity supported Sitting balance-Leahy Scale: Good     Standing balance support: During functional activity;Single extremity supported Standing balance-Leahy Scale: Fair Standing balance comment: Requires UE support during ambulation but able to stand statically without UE support.                             Pertinent Vitals/Pain Pain Assessment: Faces Faces Pain Scale: Hurts a little bit Pain Location: right knee Pain Descriptors / Indicators: Sore Pain Intervention(s): Monitored during session    Home Living Family/patient expects to be discharged to:: Private residence Living Arrangements: Other relatives (best friend) Available Help at Discharge: Friend(s);Available PRN/intermittently Type of Home: Apartment Home Access: Level entry     Home Layout: One level Home Equipment: Cane - single point      Prior Function Level of Independence: Independent with assistive device(s)         Comments: Uses SPC for community ambulation. Drives. lives with BFF who is not usually there cause she works in [place]. Has children that check on her. Does water aerobics twice/week.     Hand Dominance   Dominant Hand: Right    Extremity/Trunk Assessment   Upper Extremity Assessment Upper Extremity Assessment: Defer to OT evaluation    Lower Extremity Assessment Lower Extremity Assessment: Overall WFL for tasks assessed       Communication   Communication: No  difficulties  Cognition Arousal/Alertness: Awake/alert Behavior During Therapy: WFL for tasks assessed/performed Overall Cognitive Status: Within Functional Limits for tasks assessed                                        General Comments General comments  (skin integrity, edema, etc.): Sister present during session.    Exercises     Assessment/Plan    PT Assessment Patent does not need any further PT services  PT Problem List         PT Treatment Interventions      PT Goals (Current goals can be found in the Care Plan section)  Acute Rehab PT Goals Patient Stated Goal: to get back to water aerobics PT Goal Formulation: All assessment and education complete, DC therapy Time For Goal Achievement: 07/04/17 Potential to Achieve Goals: Fair    Frequency     Barriers to discharge        Co-evaluation               AM-PAC PT "6 Clicks" Daily Activity  Outcome Measure Difficulty turning over in bed (including adjusting bedclothes, sheets and blankets)?: None Difficulty moving from lying on back to sitting on the side of the bed? : None Difficulty sitting down on and standing up from a chair with arms (e.g., wheelchair, bedside commode, etc,.)?: None Help needed moving to and from a bed to chair (including a wheelchair)?: None Help needed walking in hospital room?: None Help needed climbing 3-5 steps with a railing? : A Little 6 Click Score: 23    End of Session   Activity Tolerance: Patient tolerated treatment well Patient left: in chair;with call bell/phone within reach;with family/visitor present Nurse Communication: Mobility status PT Visit Diagnosis: Other (comment);Other abnormalities of gait and mobility (R26.89);Unsteadiness on feet (R26.81) (DOE)    Time: 1342-1400 PT Time Calculation (min) (ACUTE ONLY): 18 min   Charges:   PT Evaluation $PT Eval Low Complexity: 1 Low     PT G CodesWray Kearns, PT, DPT [phone]    Elizabeth Mclean 06/20/2017, 2:09 PM

## 2017-06-21 LAB — COMPREHENSIVE METABOLIC PANEL
ALBUMIN: 1.9 g/dL — AB (ref 3.5–5.0)
ALT: 62 U/L — ABNORMAL HIGH (ref 14–54)
ANION GAP: 8 (ref 5–15)
AST: 112 U/L — ABNORMAL HIGH (ref 15–41)
Alkaline Phosphatase: 70 U/L (ref 38–126)
BILIRUBIN TOTAL: 3.2 mg/dL — AB (ref 0.3–1.2)
BUN: 27 mg/dL — ABNORMAL HIGH (ref 6–20)
CO2: 20 mmol/L — AB (ref 22–32)
Calcium: 8 mg/dL — ABNORMAL LOW (ref 8.9–10.3)
Chloride: 106 mmol/L (ref 101–111)
Creatinine, Ser: 1.49 mg/dL — ABNORMAL HIGH (ref 0.44–1.00)
GFR calc non Af Amer: 37 mL/min — ABNORMAL LOW (ref >60)
GFR, EST AFRICAN AMERICAN: 43 mL/min — AB (ref >60)
GLUCOSE: 82 mg/dL (ref 65–99)
POTASSIUM: 4.2 mmol/L (ref 3.5–5.1)
SODIUM: 134 mmol/L — AB (ref 135–145)
TOTAL PROTEIN: 6.7 g/dL (ref 6.5–8.1)

## 2017-06-21 LAB — CBC
HCT: 26.6 % — ABNORMAL LOW (ref 36.0–46.0)
Hemoglobin: 9.4 g/dL — ABNORMAL LOW (ref 12.0–15.0)
MCH: 33.3 pg (ref 26.0–34.0)
MCHC: 35.3 g/dL (ref 30.0–36.0)
MCV: 94.3 fL (ref 78.0–100.0)
Platelets: 77 10*3/uL — ABNORMAL LOW (ref 150–400)
RBC: 2.82 MIL/uL — ABNORMAL LOW (ref 3.87–5.11)
RDW: 17.6 % — AB (ref 11.5–15.5)
WBC: 7.7 10*3/uL (ref 4.0–10.5)

## 2017-06-21 LAB — HEPATITIS C ANTIBODY: HCV Ab: >11 s/co ratio — ABNORMAL HIGH (ref 0.0–0.9)

## 2017-06-21 MED ORDER — SPIRONOLACTONE 100 MG PO TABS: 100.0000 mg | ORAL_TABLET | Freq: Every day | ORAL | 3 refills | Status: DC

## 2017-06-21 NOTE — Progress Notes (Signed)
Subjective: No complaints.  Feeling better.  Objective: Vital signs in last 24 hours: Temp:  [98 F (36.7 C)-98.7 F (37.1 C)] 98 F (36.7 C) (10/05 0541) Pulse Rate:  [73-76] 76 (10/05 0541) Resp:  [18-20] 20 (10/05 0541) BP: (118-128)/(44-67) 118/44 (10/05 0541) SpO2:  [95 %-100 %] 100 % (10/05 0541) Weight:  [134.7 kg (296 lb 14.4 oz)] 134.7 kg (296 lb 14.4 oz) (10/05 0541) Last BM Date: 06/20/17  Intake/Output from previous day: 10/04 0701 - 10/05 0700 In: 1080 [P.O.:1080] Out: 1350 [Urine:1350] Intake/Output this shift: No intake/output data recorded.  General appearance: alert and no distress GI: obese, soft Extremities: 4+ edema  Lab Results:  Recent Labs  06/19/17 0800 06/21/17 0453  WBC 6.6 7.7  HGB 8.9* 9.4*  HCT 24.8* 26.6*  PLT 89* 77*   BMET  Recent Labs  06/19/17 0335 06/20/17 0704 06/21/17 0453  NA 137 136 134*  K 4.3 4.7 4.2  CL 110 110 106  CO2 21* 22 20*  GLUCOSE 107* 97 82  BUN 22* 28* 27*  CREATININE 1.45* 1.65* 1.49*  CALCIUM 7.9* 7.9* 8.0*   LFT  Recent Labs  06/21/17 0453  PROT 6.7  ALBUMIN 1.9*  AST 112*  ALT 62*  ALKPHOS 70  BILITOT 3.2*   PT/INR  Recent Labs  06/19/17 0800  LABPROT 22.7*  INR 2.02   Hepatitis Panel No results for input(s): HEPBSAG, HCVAB, HEPAIGM, HEPBIGM in the last 72 hours. C-Diff No results for input(s): CDIFFTOX in the last 72 hours. Fecal Lactopherrin No results for input(s): FECLLACTOFRN in the last 72 hours.  Studies/Results: No results found.  Medications:  Scheduled: . heparin  5,000 Units Subcutaneous Q8H  . lactulose  20 g Oral TID  . rifaximin  550 mg Oral BID  . sodium chloride flush  3 mL Intravenous Q12H  . spironolactone  100 mg Oral Daily   Continuous: . sodium chloride      Assessment/Plan: 1) Decompensated cirrhosis. 2) Ascites. 3) HCV cirrhosis. 4) Improving renal function.   Her creatinine has improved on spironolactone only.  From the GI standpoint  she can be discharged, but I will have her follow up in the office next Wednesday for a rapid follow up of her creatinine.  The most important point for her is to maintain the 2 gram sodium diet.  Plan: 1) Spironolactone 100 mg QD. 2) Continue to hold lasix. 3) 2 gram sodium diet.    LOS: 4 days   Elenor Wildes D 06/21/2017, 7:37 AM

## 2017-06-21 NOTE — Progress Notes (Signed)
3:1 ordered for home and to be delivered to the room today prior to discharging home; Aneta Mins [phone]

## 2017-06-21 NOTE — Progress Notes (Signed)
Pt has orders to be discharged. Discharge instructions given and pt has no additional questions at this time. Medication regimen reviewed and pt educated. Pt verbalized understanding and has no additional questions. Telemetry box removed. IV removed and site in good condition. Pt stable and waiting for transportation. 

## 2017-06-21 NOTE — Discharge Summary (Signed)
Physician Discharge Summary  Patient ID: Elizabeth Mclean MRN: [id number] DOB/AGE: Jan 25, 1956 61 y.o.  Admit date: 06/17/2017 Discharge date: 06/21/2017  Admission Diagnoses:  Discharge Diagnoses:  Active Problems:   HYPERTENSION, BENIGN ESSENTIAL, CONTROLLED   Liver cirrhosis ([place])   Discharged Condition: stable  Hospital Course:   61 year old African-American female with hypertension, history of hepatitis C, was seen by me in the clinic on 06/17/2017 for a consultation for shortness of breath and leg swelling. My initial suspicion was this was right-sided heart failure. I admitted her to the hospital for IV diuresis. However on further workup, it was evident that she does not have any significant systolic or diastolic heart failure to explain her generalized anasarca. In fact, she has cirrhotic liver on liver ultrasound likely secondary to hepatitis C. Gastric largest Dr. Carol Ada saw her on consultation and recommended treatment for cirrhosis. She is on spironolactone 100 mg along with lactulose and rifaximin. Lasix was held given increased creatinine during hospital stay. Creatinine is now improving. He'll continue to hold Lasix.  She does have stable anemia with hemoglobin between 9-10 g/d and thrombocytopenia around 70,000-90,000 without any SIGNS of bleeding. She will need outpatient endoscopy to rule out gastroesophageal varices.Patient has follow-up appointment with Dr. Benson Norway on Wednesday, October 10. I will see her on October 11 for follow-up as well.  Consults: Gastroenterology Dr. Carol Ada  Significant Diagnostic Studies: labs:  Results for Elizabeth Mclean, Elizabeth Mclean (MRN [id number]) as of 06/21/2017 08:32  Ref. Range 06/19/2017 09:05 06/20/2017 07:04 06/20/2017 17:37 06/21/2017 51:70  BASIC METABOLIC PANEL Unknown  Rpt (A)    COMPREHENSIVE METABOLIC PANEL Unknown    Rpt (A)  Sodium Latest Ref Range: 135 - 145 mmol/L  136  134 (L)  Potassium Latest Ref Range: 3.5 - 5.1 mmol/L  4.7   4.2  Chloride Latest Ref Range: 101 - 111 mmol/L  110  106  CO2 Latest Ref Range: 22 - 32 mmol/L  22  20 (L)  Glucose Latest Ref Range: 65 - 99 mg/dL  97  82  BUN Latest Ref Range: 6 - 20 mg/dL  28 (H)  27 (H)  Creatinine Latest Ref Range: 0.44 - 1.00 mg/dL  1.65 (H)  1.49 (H)  Calcium Latest Ref Range: 8.9 - 10.3 mg/dL  7.9 (L)  8.0 (L)  Anion gap Latest Ref Range: 5 - 15   4 (L)  8  Alkaline Phosphatase Latest Ref Range: 38 - 126 U/L    70  Albumin Latest Ref Range: 3.5 - 5.0 g/dL    1.9 (L)  AST Latest Ref Range: 15 - 41 U/L    112 (H)  ALT Latest Ref Range: 14 - 54 U/L    62 (H)  Total Protein Latest Ref Range: 6.5 - 8.1 g/dL    6.7  Total Bilirubin Latest Ref Range: 0.3 - 1.2 mg/dL    3.2 (H)  GFR, Est Non African American Latest Ref Range: >60 mL/min  33 (L)  37 (L)  GFR, Est African American Latest Ref Range: >60 mL/min  38 (L)  43 (L)  WBC Latest Ref Range: 4.0 - 10.5 K/uL    7.7  RBC Latest Ref Range: 3.87 - 5.11 MIL/uL    2.82 (L)  Hemoglobin Latest Ref Range: 12.0 - 15.0 g/dL    9.4 (L)  HCT Latest Ref Range: 36.0 - 46.0 %    26.6 (L)  MCV Latest Ref Range: 78.0 - 100.0 fL    94.3  MCH  Latest Ref Range: 26.0 - 34.0 pg    33.3  MCHC Latest Ref Range: 30.0 - 36.0 g/dL    35.3  RDW Latest Ref Range: 11.5 - 15.5 %    17.6 (H)  Platelets Latest Ref Range: 150 - 400 K/uL    77 (L)  HCV Ab Latest Ref Range: 0.0 - 0.9 s/co ratio   >11.0 (H)   ECHOCARDIOGRAM COMPLETE Unknown Rpt      Results for Elizabeth Mclean, Elizabeth Mclean (MRN [id number]) as of 06/21/2017 08:32  Ref. Range 06/19/2017 09:05 06/20/2017 07:04 06/20/2017 17:37 06/21/2017 32:20  BASIC METABOLIC PANEL Unknown  Rpt (A)    COMPREHENSIVE METABOLIC PANEL Unknown    Rpt (A)  Sodium Latest Ref Range: 135 - 145 mmol/L  136  134 (L)  Potassium Latest Ref Range: 3.5 - 5.1 mmol/L  4.7  4.2  Chloride Latest Ref Range: 101 - 111 mmol/L  110  106  CO2 Latest Ref Range: 22 - 32 mmol/L  22  20 (L)  Glucose Latest Ref Range: 65 - 99 mg/dL  97  82   BUN Latest Ref Range: 6 - 20 mg/dL  28 (H)  27 (H)  Creatinine Latest Ref Range: 0.44 - 1.00 mg/dL  1.65 (H)  1.49 (H)  Calcium Latest Ref Range: 8.9 - 10.3 mg/dL  7.9 (L)  8.0 (L)  Anion gap Latest Ref Range: 5 - 15   4 (L)  8  Alkaline Phosphatase Latest Ref Range: 38 - 126 U/L    70  Albumin Latest Ref Range: 3.5 - 5.0 g/dL    1.9 (L)  AST Latest Ref Range: 15 - 41 U/L    112 (H)  ALT Latest Ref Range: 14 - 54 U/L    62 (H)  Total Protein Latest Ref Range: 6.5 - 8.1 g/dL    6.7  Total Bilirubin Latest Ref Range: 0.3 - 1.2 mg/dL    3.2 (H)  GFR, Est Non African American Latest Ref Range: >60 mL/min  33 (L)  37 (L)  GFR, Est African American Latest Ref Range: >60 mL/min  38 (L)  43 (L)  WBC Latest Ref Range: 4.0 - 10.5 K/uL    7.7  RBC Latest Ref Range: 3.87 - 5.11 MIL/uL    2.82 (L)  Hemoglobin Latest Ref Range: 12.0 - 15.0 g/dL    9.4 (L)  HCT Latest Ref Range: 36.0 - 46.0 %    26.6 (L)  MCV Latest Ref Range: 78.0 - 100.0 fL    94.3  MCH Latest Ref Range: 26.0 - 34.0 pg    33.3  MCHC Latest Ref Range: 30.0 - 36.0 g/dL    35.3  RDW Latest Ref Range: 11.5 - 15.5 %    17.6 (H)  Platelets Latest Ref Range: 150 - 400 K/uL    77 (L)  HCV Ab Latest Ref Range: 0.0 - 0.9 s/co ratio   >11.0 (H)   ECHOCARDIOGRAM COMPLETE Unknown Rpt        Treatments:  Diuresis Spironolactone Lactulose, Rifaximin  Discharge Exam: Blood pressure (!) 118/44, pulse 76, temperature 98 F (36.7 C), temperature source Oral, resp. rate 20, height 5\' 7"  (1.702 m), weight 134.7 kg (296 lb 14.4 oz), last menstrual period 02/22/2016, SpO2 100 %.  Nursing note and vitals reviewed. Constitutional: She appears well-developed.  Morbidly pulse  HENT:  Head: Normocephalic and atraumatic.  Neck: No JVD .  Cardiovascular: Normal rate and regular rhythm.   Murmur (II/VI holosystolic murmur LLSB)  heard. Respiratory: Effort normal and breath sounds normal. She has no wheezes. She has no rales.  GI: Soft. She exhibits  distension. Ascites improved. There is no tenderness. There is no guarding.  Musculoskeletal: Normal range of motion. She exhibits edema (3+ b/l).  Neurological: She is alert.  Skin: Skin is warm and dry.    Disposition: 01-Home or Self Care  Discharge Instructions    Call MD for:  difficulty breathing, headache or visual disturbances    Complete by:  As directed    Diet - low sodium heart healthy    Complete by:  As directed    Increase activity slowly    Complete by:  As directed      Allergies as of 06/21/2017      Reactions   Aspirin Other (See Comments)   Stomach aches    Fexofenadine Other (See Comments)   REACTION: Leg and back pain   Hydrocodone Itching      Medication List    STOP taking these medications   furosemide 20 MG tablet Commonly known as:  LASIX   valsartan-hydrochlorothiazide 160-12.5 MG tablet Commonly known as:  DIOVAN HCT     TAKE these medications   gabapentin 300 MG capsule Commonly known as:  NEURONTIN Take 1 capsule (300 mg total) by mouth 3 (three) times daily.   hydrOXYzine 25 MG tablet Commonly known as:  ATARAX/VISTARIL Take 1 tablet (25 mg total) by mouth every 6 (six) hours as needed for itching.   lactulose 10 GM/15ML solution Commonly known as:  CHRONULAC Take 30 mLs by mouth 3 (three) times daily. Hold dose for more than 4 loose bowel movements per 24hr.   megestrol 40 MG tablet Commonly known as:  MEGACE Take 1 tablet (40 mg total) by mouth 2 (two) times daily. Can increase to two tablets twice a day in the event of heavy bleeding   NOVOLOG MIX 70/30 FLEXPEN (70-30) 100 UNIT/ML FlexPen Generic drug:  insulin aspart protamine - aspart Inject 20-24 Units into the skin 2 (two) times daily. Inject 20 units into the skin each morning and 24 units into the skin each evening.   potassium chloride SA 20 MEQ tablet Commonly known as:  K-DUR,KLOR-CON Take 20 mEq by mouth daily.   RA VITAMIN D-3 2000 units Caps Generic drug:   Cholecalciferol Take 1 capsule by mouth daily.   rifaximin 550 MG Tabs tablet Commonly known as:  XIFAXAN Take 550 mg by mouth 2 (two) times daily.   spironolactone 100 MG tablet Commonly known as:  ALDACTONE Take 1 tablet (100 mg total) by mouth daily. What changed:  medication strength  how much to take  when to take this   terbinafine 250 MG tablet Commonly known as:  LAMISIL Take 1 tablet (250 mg total) by mouth daily.   traMADol 50 MG tablet Commonly known as:  ULTRAM Take 1 tablet (50 mg total) by mouth every 8 (eight) hours as needed for pain.   Venlafaxine HCl 75 MG Tb24 Take 1 tablet (75 mg total) by mouth daily.   VESICARE 5 MG tablet Generic drug:  solifenacin Take 5 mg by mouth daily.   VITAMIN C PO Take 1 tablet by mouth daily.      Follow-up Information    Kerin Perna, NP Follow up on 07/05/2017.   Specialty:  Internal Medicine Why:  At 3:15pm for hospital follow up  Contact information: [address] [place] [address] [phone]  Nigel Mormon, MD Follow up on 06/27/2017.   Specialty:  Cardiology Why:  8:30 AM Contact information: [address] [place] [address] [phone]           Signed: Nigel Mormon 06/21/2017, 8:29 AM  Nigel Mormon, MD [facility] Pager: [phone] Office: [phone] If no answer Cell [phone]

## 2017-06-21 NOTE — Progress Notes (Signed)
Pt c/o headaches since stopping Zyrtec for sinuses about 1 month ago. Will pass on and continue to monitor.

## 2017-06-22 LAB — HEPATITIS C VRS RNA DETECT BY PCR-QUAL: Hepatitis C Vrs RNA by PCR-Qual: POSITIVE — AB

## 2017-06-23 LAB — HEPATITIS C GENOTYPE

## 2017-06-25 ENCOUNTER — Encounter: Payer: Self-pay | Admitting: Family Medicine

## 2017-06-25 ENCOUNTER — Ambulatory Visit (INDEPENDENT_AMBULATORY_CARE_PROVIDER_SITE_OTHER): Payer: Medicaid Other | Admitting: Family Medicine

## 2017-06-25 VITALS — BP 148/57 | HR 74 | Ht 67.0 in | Wt 298.6 lb

## 2017-06-25 DIAGNOSIS — N924 Excessive bleeding in the premenopausal period: Secondary | ICD-10-CM

## 2017-06-25 DIAGNOSIS — K7460 Unspecified cirrhosis of liver: Secondary | ICD-10-CM

## 2017-06-25 DIAGNOSIS — R188 Other ascites: Secondary | ICD-10-CM | POA: Diagnosis not present

## 2017-06-25 DIAGNOSIS — B1710 Acute hepatitis C without hepatic coma: Secondary | ICD-10-CM | POA: Diagnosis not present

## 2017-06-25 NOTE — Progress Notes (Signed)
   Subjective:    Patient ID: Elizabeth Mclean is a 61 y.o. female presenting with Menorrhagia  on 06/25/2017  HPI: Has had a long w/u for postmenopausal bleeding. She is on Megace and stopped it due to it making her lose energy and she was not having further bleeding. Went to [place] and found to be unresponsive. Found out that her liver was not working well and she had pneumonia. Has had period bleeding since 9/18.  Review of Systems  Constitutional: Positive for fatigue. Negative for chills and fever.  Respiratory: Negative for shortness of breath.   Cardiovascular: Negative for chest pain.  Gastrointestinal: Positive for diarrhea. Negative for abdominal pain, nausea and vomiting.  Genitourinary: Positive for vaginal bleeding. Negative for dysuria.  Musculoskeletal: Positive for joint swelling and myalgias.  Skin: Negative for rash.      Objective:    BP (!) 148/57   Pulse 74   Ht 5\' 7"  (1.702 m)   Wt 298 lb 9.6 oz (135.4 kg)   LMP 06/04/2017 (LMP Unknown)   BMI 46.77 kg/m  Physical Exam  Constitutional: She is oriented to person, place, and time. She appears well-developed and well-nourished. No distress.  HENT:  Head: Normocephalic and atraumatic.  Eyes: Scleral icterus is present.  Neck: Neck supple.  Cardiovascular: Normal rate.   Pulmonary/Chest: Effort normal.  Abdominal: Soft.  protuberant  Neurological: She is alert and oriented to person, place, and time.  Skin: Skin is warm and dry.  Psychiatric: She has a normal mood and affect.        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 She probably should not be on oral medications with first pass liver effects--would consider IUD as a way to stop menorrhagia--she was  unwilling to have this placed today.      Liver cirrhosis ([address]) - Primary      Total face-to-face time with patient: 15 minutes. Over 50% of encounter was spent on counseling and coordination of care. Return in about 2 weeks (around 07/09/2017). for IUD insertion  Donnamae Jude 06/25/2017 4:22 PM

## 2017-06-25 NOTE — Patient Instructions (Signed)

## 2017-06-26 NOTE — Assessment & Plan Note (Signed)
Patient with new liver disease---multiple previous w/u for PMB including 7/18 EMB--INR is not normal, sure this is contributing. She probably should not be on oral medications with first pass liver effects--would consider IUD as a way to stop menorrhagia--she was unwilling to have this placed today.

## 2017-07-17 ENCOUNTER — Ambulatory Visit (INDEPENDENT_AMBULATORY_CARE_PROVIDER_SITE_OTHER): Payer: Medicaid Other | Admitting: Family Medicine

## 2017-07-17 ENCOUNTER — Encounter: Payer: Self-pay | Admitting: Family Medicine

## 2017-07-17 VITALS — BP 142/64 | HR 76 | Wt 260.0 lb

## 2017-07-17 DIAGNOSIS — N924 Excessive bleeding in the premenopausal period: Secondary | ICD-10-CM

## 2017-07-17 DIAGNOSIS — Z30430 Encounter for insertion of intrauterine contraceptive device: Secondary | ICD-10-CM | POA: Diagnosis present

## 2017-07-17 MED ORDER — LEVONORGESTREL 19.5 MCG/DAY IU IUD: INTRAUTERINE_SYSTEM | Freq: Once | INTRAUTERINE | Status: DC

## 2017-07-17 NOTE — Patient Instructions (Signed)

## 2017-07-18 NOTE — Assessment & Plan Note (Signed)
S/p progesterone containing IUD due to liver disease--hopefully this will take care of her bleeding.

## 2017-07-18 NOTE — Progress Notes (Signed)
   Subjective:    Patient ID: Elizabeth Mclean is a 61 y.o. female presenting with Follow-up  on 07/17/2017  HPI: Here with postmenopausal bleeding. Previous w/u includes D and C/hysteroscopy in 9/17 with benign pathology and EMB in 6/18 with benign path. She is also in end-stage liver disease. Bleeding has remained stable. We decided to attempt IUD due to first pass liver effects last visit. She is here for insertion.  Review of Systems  Constitutional: Negative for chills and fever.  Respiratory: Negative for shortness of breath.   Cardiovascular: Negative for chest pain.  Gastrointestinal: Negative for abdominal pain, nausea and vomiting.  Genitourinary: Negative for dysuria.  Skin: Negative for rash.      Objective:    BP (!) 142/64   Pulse 76   Wt 260 lb (117.9 kg)   LMP 06/04/2017 (LMP Unknown)   BMI 40.72 kg/m  Physical Exam  Constitutional: She is oriented to person, place, and time. She appears well-developed and well-nourished. No distress.  HENT:  Head: Normocephalic and atraumatic.  Eyes: No scleral icterus.  Neck: Neck supple.  Cardiovascular: Normal rate.   Pulmonary/Chest: Effort normal.  Abdominal: Soft.  Neurological: She is alert and oriented to person, place, and time.  Skin: Skin is warm and dry.  Psychiatric: She has a normal mood and affect.   Procedure: Patient identified, informed consent performed, signed copy in chart, time out was performed.  Urine pregnancy test negative.  Speculum placed in the vagina.  Cervix visualized.  Cleaned with Betadine x 2.  Grasped anteriourly with a single tooth tenaculum.  Uterus sounded to 7 cm.  Liletta IUD placed per manufacturer's recommendations.  Strings trimmed to 3 cm.   Patient given post procedure instructions and Liletta care card with expiration date.       Assessment & Plan:   Problem List Items Addressed This Visit      Unprioritized   Menopausal bleeding - Primary    S/p progesterone  containing IUD due to liver disease--hopefully this will take care of her bleeding.       Relevant Medications   Levonorgestrel IUD     Return if symptoms worsen or fail to improve.  Donnamae Jude 07/18/2017 7:30 AM

## 2017-07-22 ENCOUNTER — Ambulatory Visit
Admission: RE | Admit: 2017-07-22 | Discharge: 2017-07-22 | Disposition: A | Discharge status: Home / Self Care | Payer: Medicaid Other | Source: Ambulatory Visit | Attending: Adult Health | Admitting: Adult Health

## 2017-07-22 DIAGNOSIS — D0512 Intraductal carcinoma in situ of left breast: Secondary | ICD-10-CM

## 2017-07-22 HISTORY — DX: Personal history of irradiation: Z92.3

## 2017-08-13 ENCOUNTER — Telehealth: Payer: Self-pay | Admitting: Family Medicine

## 2017-08-13 NOTE — Telephone Encounter (Signed)
Patient called to say she is still bleeding, and wanted to get a call back from a nurse  May leave a voice mail if needed.

## 2017-09-04 ENCOUNTER — Inpatient Hospital Stay (HOSPITAL_COMMUNITY)
Admission: AD | Admit: 2017-09-04 | Discharge: 2017-09-05 | Disposition: A | Discharge status: Home / Self Care | Payer: Medicaid Other | Source: Ambulatory Visit | Attending: Obstetrics and Gynecology | Admitting: Obstetrics and Gynecology

## 2017-09-04 ENCOUNTER — Encounter (HOSPITAL_COMMUNITY): Payer: Self-pay | Admitting: Emergency Medicine

## 2017-09-04 DIAGNOSIS — K769 Liver disease, unspecified: Secondary | ICD-10-CM | POA: Insufficient documentation

## 2017-09-04 DIAGNOSIS — Z87891 Personal history of nicotine dependence: Secondary | ICD-10-CM | POA: Diagnosis not present

## 2017-09-04 DIAGNOSIS — N939 Abnormal uterine and vaginal bleeding, unspecified: Secondary | ICD-10-CM | POA: Diagnosis present

## 2017-09-04 DIAGNOSIS — N950 Postmenopausal bleeding: Secondary | ICD-10-CM | POA: Insufficient documentation

## 2017-09-04 LAB — URINALYSIS, ROUTINE W REFLEX MICROSCOPIC
Bilirubin Urine: NEGATIVE
Glucose, UA: NEGATIVE mg/dL
KETONES UR: NEGATIVE mg/dL
Leukocytes, UA: NEGATIVE
Nitrite: NEGATIVE
PH: 5 (ref 5.0–8.0)
Protein, ur: NEGATIVE mg/dL
SPECIFIC GRAVITY, URINE: 1.018 (ref 1.005–1.030)

## 2017-09-04 LAB — WET PREP, GENITAL
SPERM: NONE SEEN
TRICH WET PREP: NONE SEEN
YEAST WET PREP: NONE SEEN

## 2017-09-04 LAB — POCT PREGNANCY, URINE: Preg Test, Ur: NEGATIVE

## 2017-09-04 NOTE — MAU Note (Signed)
Pt states she is being followed for abnormal vaginal bleeding. States she had an IUD placed on 07/17/2017. States she is still bleeding. States she changes pad about 3 times a day, wears depends at nighttime. States she is now feeling weak and tired all the time. States she started taking iron supplements a few weeks ago, but still feels like she does not have energy and is cold all of the time.

## 2017-09-04 NOTE — Telephone Encounter (Signed)
I called Elizabeth Mclean back and she states she continues to bleed everyday since September . She states she changes her pad at least 3 times during the day and wears a depends at night. States she feels lightheaded and dizzy, and very weak. States she was trying to wait to come see [place] until after holidays but can't live this way. . Per discussion with front office we do not have any appointments for today or tomorrow- advised Elizabeth Mclean to go to [facility] for evaluation and that I would go ahead and send a message for her to be seen in our office because she will need follow up after mau anyways. She voices understanding.

## 2017-09-04 NOTE — MAU Provider Note (Signed)
History  CSN: [id number] Arrival date and time: 09/04/17 2017  First Provider Initiated Contact with Patient 09/04/17 2147      Chief Complaint  Patient presents with  . Vaginal Bleeding    HPI: Elizabeth Mclean is a 61 y.o. postmenopausal woman who presents to maternity admissions reporting persistent vaginal bleeding. She has had a long work up for postmenopausal bleeding and had IUD placed on 07/17/17 for management to avoid oral medications given that she was recently diagnosed with liver disease. Tonight she reports that she has had persistent bleeding, and it has not gotten any better since IUD placement. Reports going through about 3 pads a day on average, but bleeding alternates from light to heavy. Reports she came in tonight because of excessive fatigue/feeling weak. Denies dizziness, lightheadedness, DOE, or syncope. Also reports feeling cold all the time. States she just started taking iron once a day.  Denies abdominal pain, fever, chills, N/V.   OB History  Gravida Para Term Preterm AB Living  5 4     1 3   SAB TAB Ectopic Multiple Live Births    1     4    # Outcome Date GA Lbr Len/2nd Weight Sex Delivery Anes PTL Lv  5 TAB           4 Para           3 Para           2 Para           1 Para              Past Medical History:  Diagnosis Date  . Allergy   . Arthritis    hips, shoulders, face  . Bilateral swelling of feet   . Bone spur of foot   . Breast cancer ([place]) 2011  . Cancer [facility]) 2011   Left Breast cancer  . Chronic pain   . Depression   . Diabetes mellitus without complication ([place])   . GERD (gastroesophageal reflux disease)   . Hepatitis C   . Hypertension   . Insomnia   . Leg cramps   . Personal history of radiation therapy 2011   Left Breast Cancer   Past Surgical History:  Procedure Laterality Date  . BREAST LUMPECTOMY Left 2011  . BREAST SURGERY    . COLONOSCOPY    . epidural steroid injection    . HYSTEROSCOPY W/D&C N/A 05/15/2016   Procedure: DILATATION AND CURETTAGE /HYSTEROSCOPY;  Surgeon: Lavonia Drafts, MD;  Location: [facility];  Service: Gynecology;  Laterality: N/A;  . MOUTH SURGERY    . THUMB SURGERY     Social History   Socioeconomic History  . Marital status: Single    Spouse name: Not on file  . Number of children: Not on file  . Years of education: Not on file  . Highest education level: Not on file  Social Needs  . Financial resource strain: Not on file  . Food insecurity - worry: Not on file  . Food insecurity - inability: Not on file  . Transportation needs - medical: Not on file  . Transportation needs - non-medical: Not on file  Occupational History  . Not on file  Tobacco Use  . Smoking status: Former Research scientist (life sciences)  . Smokeless tobacco: Never Used  Substance and Sexual Activity  . Alcohol use: No  . Drug use: No  . Sexual activity: Yes  Other Topics Concern  . Not on file  Social  History Narrative  . Not on file   Allergies  Allergen Reactions  . Aspirin Other (See Comments)    Stomach aches   . Fexofenadine Other (See Comments)    REACTION: Leg and back pain  . Hydrocodone Itching    Facility-Administered Medications Prior to Admission  Medication Dose Route Frequency Provider Last Rate Last Dose  . Levonorgestrel IUD   Intrauterine Once Donnamae Jude, MD       Medications Prior to Admission  Medication Sig Dispense Refill Last Dose  . Ascorbic Acid (VITAMIN C PO) Take 1 tablet by mouth daily.   Taking  . lactulose (CHRONULAC) 10 GM/15ML solution Take 30 mLs by mouth 3 (three) times daily. Hold dose for more than 4 loose bowel movements per 24hr.  0 Taking  . NOVOLOG MIX 70/30 FLEXPEN (70-30) 100 UNIT/ML FlexPen Inject 20-24 Units into the skin 2 (two) times daily. Inject 20 units into the skin each morning and 24 units into the skin each evening.  0 Not Taking  . potassium chloride SA (K-DUR,KLOR-CON) 20 MEQ tablet Take 20 mEq by mouth daily.   Taking  . RA VITAMIN D-3 2000  units CAPS Take 1 capsule by mouth daily.  0 Taking  . rifaximin (XIFAXAN) 550 MG TABS tablet Take 550 mg by mouth 2 (two) times daily.   Taking  . spironolactone (ALDACTONE) 100 MG tablet Take 1 tablet (100 mg total) by mouth daily. 60 tablet 3 Taking  . VESICARE 5 MG tablet Take 5 mg by mouth daily.  0 Taking    I have reviewed patient's Past Medical Hx, Surgical Hx, Family Hx, Social Hx, medications and allergies.   Review of Systems: Negative except for what is mentioned in HPI.  Physical Exam   Blood pressure (!) 130/55, pulse 85, temperature 98.2 F (36.8 C), temperature source Oral, resp. rate 16, height 5\' 7"  (1.702 m), weight 260 lb (117.9 kg), last menstrual period 06/04/2017, SpO2 100 %.  Constitutional: Well-developed, well-nourished female in no acute distress.  HENT: [place]/AT, normal oropharynx mucosa. MMM Eyes: normal conjunctivae, muddy sclerae Cardiovascular: normal rate, regular rhythm Respiratory: normal effort, lungs CTAB.  GI: Abd soft, non-tender, non-distended Pelvic: NEFG. Normal vaginal mucosa without lesions; moderate amount of dark blood in vaginal vault w/o active bleeding. Cervix pink without lesions.  Normal bimanual exam without tenderness or palpable masses, but exam limited by body habitus.  MSK: Extremities nontender, no edema Neurologic: Alert and oriented x 4. Psych: Normal mood and affect Skin: warm and dry    MAU Course/MDM:   Nursing notes and VS reviewed. Patient seen and examined, as noted above.  She is hemodynamically stable.  Labs ordered: CBC , LFTs, coags Hgb 9.9 from 9.4 two months ago. Chronic thrombocytopenia stable. LFTs elevated but stable.  Dicussed with Dr. Glo Herring. Will restart megace and have patient follow up outpatient.  Assessment and Plan  Assessment: 1. Postmenopausal bleeding   2. Chronic liver disease     Plan: --Restart Megace --Follow up outpatient at [facility] (message sent to admin pool) --Discharge home in  stable condition.   [name], [name], MD 09/04/2017 9:48 PM

## 2017-09-05 DIAGNOSIS — N950 Postmenopausal bleeding: Secondary | ICD-10-CM

## 2017-09-05 LAB — COMPREHENSIVE METABOLIC PANEL
ALBUMIN: 2.2 g/dL — AB (ref 3.5–5.0)
ALK PHOS: 163 U/L — AB (ref 38–126)
ALT: 74 U/L — ABNORMAL HIGH (ref 14–54)
ANION GAP: 9 (ref 5–15)
AST: 156 U/L — ABNORMAL HIGH (ref 15–41)
BUN: 28 mg/dL — ABNORMAL HIGH (ref 6–20)
CHLORIDE: 105 mmol/L (ref 101–111)
CO2: 14 mmol/L — AB (ref 22–32)
Calcium: 8.4 mg/dL — ABNORMAL LOW (ref 8.9–10.3)
Creatinine, Ser: 1.15 mg/dL — ABNORMAL HIGH (ref 0.44–1.00)
GFR calc non Af Amer: 50 mL/min — ABNORMAL LOW (ref >60)
GFR, EST AFRICAN AMERICAN: 58 mL/min — AB (ref >60)
GLUCOSE: 105 mg/dL — AB (ref 65–99)
Potassium: 5.2 mmol/L — ABNORMAL HIGH (ref 3.5–5.1)
SODIUM: 128 mmol/L — AB (ref 135–145)
Total Bilirubin: 3.7 mg/dL — ABNORMAL HIGH (ref 0.3–1.2)
Total Protein: 6.6 g/dL (ref 6.5–8.1)

## 2017-09-05 LAB — CBC WITH DIFFERENTIAL/PLATELET
BASOS PCT: 0 %
Basophils Absolute: 0 10*3/uL (ref 0.0–0.1)
Eosinophils Absolute: 0.1 10*3/uL (ref 0.0–0.7)
Eosinophils Relative: 1 %
HEMATOCRIT: 27.2 % — AB (ref 36.0–46.0)
HEMOGLOBIN: 9.9 g/dL — AB (ref 12.0–15.0)
LYMPHS ABS: 1.7 10*3/uL (ref 0.7–4.0)
LYMPHS PCT: 20 %
MCH: 32.8 pg (ref 26.0–34.0)
MCHC: 36.4 g/dL — AB (ref 30.0–36.0)
MCV: 90.1 fL (ref 78.0–100.0)
MONO ABS: 0.7 10*3/uL (ref 0.1–1.0)
MONOS PCT: 8 %
NEUTROS ABS: 6 10*3/uL (ref 1.7–7.7)
NEUTROS PCT: 71 %
Platelets: 75 10*3/uL — ABNORMAL LOW (ref 150–400)
RBC: 3.02 MIL/uL — ABNORMAL LOW (ref 3.87–5.11)
RDW: 17.5 % — AB (ref 11.5–15.5)
WBC: 8.5 10*3/uL (ref 4.0–10.5)

## 2017-09-05 MED ORDER — MEGESTROL ACETATE 40 MG PO TABS: 40.0000 mg | ORAL_TABLET | Freq: Two times a day (BID) | ORAL | 0 refills | Status: DC

## 2017-09-05 NOTE — Discharge Instructions (Signed)
Iron Deficiency Anemia, Adult Iron deficiency anemia is a condition in which the concentration of red blood cells or hemoglobin in the blood is below normal because of too little iron. Hemoglobin is a substance in red blood cells that carries oxygen to the body's tissues. When the concentration of red blood cells or hemoglobin is too low, not enough oxygen reaches these tissues. Iron deficiency anemia is usually long-lasting (chronic) and it develops over time. It may or may not cause symptoms. It is a common type of anemia. What are the causes? This condition may be caused by:  Not enough iron in the diet.  Blood loss caused by bleeding in the intestine.  Blood loss from a gastrointestinal condition like Crohn disease.  Frequent blood draws, such as from blood donation.  Abnormal absorption in the gut.  Heavy menstrual periods in women.  Cancers of the gastrointestinal system, such as colon cancer.  What are the signs or symptoms? Symptoms of this condition may include:  Fatigue.  Headache.  Pale skin, lips, and nail beds.  Poor appetite.  Weakness.  Shortness of breath.  Dizziness.  Cold hands and feet.  Fast or irregular heartbeat.  Irritability. This is more common in severe anemia.  Rapid breathing. This is more common in severe anemia.  Mild anemia may not cause any symptoms. How is this diagnosed? This condition is diagnosed based on:  Your medical history.  A physical exam.  Blood tests.  You may have additional tests to find the underlying cause of your anemia, such as:  Testing for blood in the stool (fecal occult blood test).  A procedure to see inside your colon and rectum (colonoscopy).  A procedure to see inside your esophagus and stomach (endoscopy).  A test in which cells are removed from bone marrow (bone marrow aspiration) or fluid is removed from the bone marrow to be examined (biopsy). This is rarely needed.  How is this  treated? This condition is treated by correcting the cause of your iron deficiency. Treatment may involve:  Adding iron-rich foods to your diet.  Taking iron supplements. If you are pregnant or breastfeeding, you may need to take extra iron because your normal diet usually does not provide the amount of iron that you need.  Increasing vitamin C intake. Vitamin C helps your body absorb iron. Your health care provider may recommend that you take iron supplements along with a glass of orange juice or a vitamin C supplement.  Medicines to make heavy menstrual flow lighter.  Surgery.  You may need repeat blood tests to determine whether treatment is working. Depending on the underlying cause, the anemia should be corrected within 2 months of starting treatment. If the treatment does not seem to be working, you may need more testing. Follow these instructions at home: Medicines  Take over-the-counter and prescription medicines only as told by your health care provider. This includes iron supplements and vitamins.  If you cannot tolerate taking iron supplements by mouth, talk with your health care provider about taking them through a vein (intravenously) or an injection into a muscle.  For the best iron absorption, you should take iron supplements when your stomach is empty. If you cannot tolerate them on an empty stomach, you may need to take them with food.  Do not drink milk or tea or take antacids at the same time as your iron supplements. Milk and antacids may interfere with iron absorption.  Iron supplements can cause constipation. To prevent constipation,  include fiber in your diet as told by your health care provider. A stool softener may also be recommended. Eating and drinking  Talk with your health care provider before changing your diet. He or she may recommend that you eat foods that contain a lot of iron, such as: ? Liver. ? Low-fat (lean) beef. ? Breads and cereals that have  iron added to them (are fortified). ? Eggs. ? Dried fruit. ? Dark green, leafy vegetables.  To help your body use the iron from iron-rich foods, eat those foods at the same time as fresh fruits and vegetables that are high in vitamin C. Foods that are high in vitamin C include: ? Oranges. ? Peppers. ? Tomatoes. ? Mangoes.  Drinkenoughfluid to keep your urine clear or pale yellow. General instructions  Return to your normal activities as told by your health care provider. Ask your health care provider what activities are safe for you.  Practice good hygiene. Anemia can make you more prone to illness and infection.  Keep all follow-up visits as told by your health care provider. This is important. Contact a health care provider if:  You feel nauseous or you vomit.  You feel weak.  You have unexplained sweating.  You develop symptoms of constipation, such as: ? Having fewer than three bowel movements a week. ? Straining to have a bowel movement. ? Having stools that are hard, dry, or larger than normal. ? Feeling full or bloated. ? Pain in the lower abdomen. ? Not feeling relief after having a bowel movement. Get help right away if:  You faint. If this happens, do not drive yourself to the hospital. Call your local emergency services (911 in the [place]  You have chest pain.  You have shortness of breath that: ? Is severe. ? Gets worse with physical activity.  You have a rapid heartbeat.  You become light-headed when getting up from a sitting or lying down position. This information is not intended to replace advice given to you by your health care provider. Make sure you discuss any questions you have with your health care provider. Document Released: 08/31/2000 Document Revised: 05/23/2016 Document Reviewed: 05/23/2016 Elsevier Interactive Patient Education  2018 [employer].

## 2017-09-18 ENCOUNTER — Other Ambulatory Visit: Payer: Self-pay | Admitting: Gastroenterology

## 2017-09-18 DIAGNOSIS — M7989 Other specified soft tissue disorders: Principal | ICD-10-CM

## 2017-09-18 DIAGNOSIS — M79661 Pain in right lower leg: Secondary | ICD-10-CM

## 2017-09-20 ENCOUNTER — Ambulatory Visit: Payer: Medicaid Other | Admitting: Obstetrics and Gynecology

## 2017-09-24 ENCOUNTER — Other Ambulatory Visit: Payer: Self-pay | Admitting: Gastroenterology

## 2017-09-24 ENCOUNTER — Other Ambulatory Visit: Payer: Medicaid Other

## 2017-09-25 ENCOUNTER — Encounter: Payer: Self-pay | Admitting: Podiatry

## 2017-09-25 ENCOUNTER — Ambulatory Visit: Payer: Medicaid Other | Admitting: Podiatry

## 2017-09-25 ENCOUNTER — Ambulatory Visit (INDEPENDENT_AMBULATORY_CARE_PROVIDER_SITE_OTHER): Payer: Medicaid Other

## 2017-09-25 DIAGNOSIS — M7751 Other enthesopathy of right foot: Secondary | ICD-10-CM

## 2017-09-25 DIAGNOSIS — M779 Enthesopathy, unspecified: Secondary | ICD-10-CM

## 2017-09-25 MED ORDER — TRIAMCINOLONE ACETONIDE 10 MG/ML IJ SUSP
10.0000 mg | Freq: Once | INTRAMUSCULAR | Status: AC
  Administered 2017-09-25: 10 mg

## 2017-09-25 NOTE — Progress Notes (Signed)
Subjective:   Patient ID: Elizabeth Mclean, female   DOB: 62 y.o.   MRN: [id number]   HPI Patient presents stating she is started to develop a lot of pain in the outside of her right foot and she does not remember specific injury   ROS      Objective:  Physical Exam  Neurovascular status intact with quite a bit of discomfort at the peroneal insertion right foot with inflammation fluid upon deep palpation     Assessment:  Peroneal tendinitis right with inflammation     Plan:  H&P condition reviewed and obesity factor reviewed with patient.  Today I did a careful sheath injection right 3 mg Kenalog 5 mm Xylocaine advised on physical therapy patient will be seen back to recheck  X-rays indicate that there is no signs of fracture or advanced arthritic condition

## 2017-10-07 ENCOUNTER — Inpatient Hospital Stay (HOSPITAL_COMMUNITY)
Admission: EM | Admit: 2017-10-07 | Discharge: 2017-10-15 | DRG: 444 | Disposition: A | Discharge status: Home / Self Care | Payer: Medicaid Other | Attending: Internal Medicine | Admitting: Internal Medicine

## 2017-10-07 ENCOUNTER — Encounter (HOSPITAL_COMMUNITY): Payer: Self-pay | Admitting: *Deleted

## 2017-10-07 ENCOUNTER — Emergency Department (HOSPITAL_COMMUNITY): Payer: Medicaid Other

## 2017-10-07 ENCOUNTER — Other Ambulatory Visit: Payer: Self-pay

## 2017-10-07 DIAGNOSIS — D6959 Other secondary thrombocytopenia: Secondary | ICD-10-CM | POA: Diagnosis present

## 2017-10-07 DIAGNOSIS — I129 Hypertensive chronic kidney disease with stage 1 through stage 4 chronic kidney disease, or unspecified chronic kidney disease: Secondary | ICD-10-CM | POA: Diagnosis present

## 2017-10-07 DIAGNOSIS — Z6841 Body Mass Index (BMI) 40.0 and over, adult: Secondary | ICD-10-CM

## 2017-10-07 DIAGNOSIS — R195 Other fecal abnormalities: Secondary | ICD-10-CM | POA: Diagnosis present

## 2017-10-07 DIAGNOSIS — Z87891 Personal history of nicotine dependence: Secondary | ICD-10-CM

## 2017-10-07 DIAGNOSIS — I8501 Esophageal varices with bleeding: Secondary | ICD-10-CM | POA: Diagnosis present

## 2017-10-07 DIAGNOSIS — M160 Bilateral primary osteoarthritis of hip: Secondary | ICD-10-CM | POA: Diagnosis present

## 2017-10-07 DIAGNOSIS — D696 Thrombocytopenia, unspecified: Secondary | ICD-10-CM | POA: Diagnosis present

## 2017-10-07 DIAGNOSIS — R601 Generalized edema: Secondary | ICD-10-CM | POA: Diagnosis present

## 2017-10-07 DIAGNOSIS — M19011 Primary osteoarthritis, right shoulder: Secondary | ICD-10-CM | POA: Diagnosis present

## 2017-10-07 DIAGNOSIS — N183 Chronic kidney disease, stage 3 (moderate): Secondary | ICD-10-CM | POA: Diagnosis present

## 2017-10-07 DIAGNOSIS — K7460 Unspecified cirrhosis of liver: Secondary | ICD-10-CM | POA: Diagnosis present

## 2017-10-07 DIAGNOSIS — R112 Nausea with vomiting, unspecified: Secondary | ICD-10-CM

## 2017-10-07 DIAGNOSIS — E1122 Type 2 diabetes mellitus with diabetic chronic kidney disease: Secondary | ICD-10-CM | POA: Diagnosis present

## 2017-10-07 DIAGNOSIS — Z975 Presence of (intrauterine) contraceptive device: Secondary | ICD-10-CM

## 2017-10-07 DIAGNOSIS — K219 Gastro-esophageal reflux disease without esophagitis: Secondary | ICD-10-CM | POA: Diagnosis present

## 2017-10-07 DIAGNOSIS — K922 Gastrointestinal hemorrhage, unspecified: Secondary | ICD-10-CM

## 2017-10-07 DIAGNOSIS — R1011 Right upper quadrant pain: Secondary | ICD-10-CM

## 2017-10-07 DIAGNOSIS — Z833 Family history of diabetes mellitus: Secondary | ICD-10-CM

## 2017-10-07 DIAGNOSIS — M19012 Primary osteoarthritis, left shoulder: Secondary | ICD-10-CM | POA: Diagnosis present

## 2017-10-07 DIAGNOSIS — Z885 Allergy status to narcotic agent status: Secondary | ICD-10-CM

## 2017-10-07 DIAGNOSIS — R188 Other ascites: Secondary | ICD-10-CM

## 2017-10-07 DIAGNOSIS — K31819 Angiodysplasia of stomach and duodenum without bleeding: Secondary | ICD-10-CM | POA: Diagnosis present

## 2017-10-07 DIAGNOSIS — K766 Portal hypertension: Secondary | ICD-10-CM | POA: Diagnosis present

## 2017-10-07 DIAGNOSIS — E875 Hyperkalemia: Secondary | ICD-10-CM | POA: Diagnosis present

## 2017-10-07 DIAGNOSIS — E8770 Fluid overload, unspecified: Secondary | ICD-10-CM | POA: Diagnosis not present

## 2017-10-07 DIAGNOSIS — D649 Anemia, unspecified: Secondary | ICD-10-CM | POA: Diagnosis present

## 2017-10-07 DIAGNOSIS — Z886 Allergy status to analgesic agent status: Secondary | ICD-10-CM

## 2017-10-07 DIAGNOSIS — E8809 Other disorders of plasma-protein metabolism, not elsewhere classified: Secondary | ICD-10-CM | POA: Diagnosis present

## 2017-10-07 DIAGNOSIS — K810 Acute cholecystitis: Principal | ICD-10-CM | POA: Diagnosis present

## 2017-10-07 DIAGNOSIS — Z853 Personal history of malignant neoplasm of breast: Secondary | ICD-10-CM

## 2017-10-07 DIAGNOSIS — K7031 Alcoholic cirrhosis of liver with ascites: Secondary | ICD-10-CM | POA: Diagnosis present

## 2017-10-07 DIAGNOSIS — Z888 Allergy status to other drugs, medicaments and biological substances status: Secondary | ICD-10-CM

## 2017-10-07 LAB — COMPREHENSIVE METABOLIC PANEL
ALBUMIN: 2 g/dL — AB (ref 3.5–5.0)
ALT: 79 U/L — AB (ref 14–54)
AST: 166 U/L — AB (ref 15–41)
Alkaline Phosphatase: 120 U/L (ref 38–126)
Anion gap: 8 (ref 5–15)
BUN: 37 mg/dL — ABNORMAL HIGH (ref 6–20)
CHLORIDE: 104 mmol/L (ref 101–111)
CO2: 20 mmol/L — ABNORMAL LOW (ref 22–32)
Calcium: 8 mg/dL — ABNORMAL LOW (ref 8.9–10.3)
Creatinine, Ser: 1.66 mg/dL — ABNORMAL HIGH (ref 0.44–1.00)
GFR calc Af Amer: 37 mL/min — ABNORMAL LOW (ref >60)
GFR calc non Af Amer: 32 mL/min — ABNORMAL LOW (ref >60)
GLUCOSE: 133 mg/dL — AB (ref 65–99)
POTASSIUM: 5 mmol/L (ref 3.5–5.1)
Sodium: 132 mmol/L — ABNORMAL LOW (ref 135–145)
Total Bilirubin: 4.6 mg/dL — ABNORMAL HIGH (ref 0.3–1.2)
Total Protein: 7 g/dL (ref 6.5–8.1)

## 2017-10-07 LAB — CBC
HEMATOCRIT: 28 % — AB (ref 36.0–46.0)
Hemoglobin: 10 g/dL — ABNORMAL LOW (ref 12.0–15.0)
MCH: 33.2 pg (ref 26.0–34.0)
MCHC: 35.7 g/dL (ref 30.0–36.0)
MCV: 93 fL (ref 78.0–100.0)
Platelets: 74 10*3/uL — ABNORMAL LOW (ref 150–400)
RBC: 3.01 MIL/uL — ABNORMAL LOW (ref 3.87–5.11)
RDW: 17.3 % — ABNORMAL HIGH (ref 11.5–15.5)
WBC: 12.2 10*3/uL — ABNORMAL HIGH (ref 4.0–10.5)

## 2017-10-07 LAB — POC OCCULT BLOOD, ED: Fecal Occult Bld: POSITIVE — AB

## 2017-10-07 LAB — BRAIN NATRIURETIC PEPTIDE: B Natriuretic Peptide: 26.9 pg/mL (ref 0.0–100.0)

## 2017-10-07 LAB — LIPASE, BLOOD: LIPASE: 55 U/L — AB (ref 11–51)

## 2017-10-07 LAB — I-STAT TROPONIN, ED: TROPONIN I, POC: 0.01 ng/mL (ref 0.00–0.08)

## 2017-10-07 MED ORDER — ONDANSETRON HCL 4 MG/2ML IJ SOLN
4.0000 mg | Freq: Once | INTRAMUSCULAR | Status: AC
  Administered 2017-10-07: 4 mg via INTRAVENOUS
  Filled 2017-10-07: qty 2

## 2017-10-07 MED ORDER — MORPHINE SULFATE (PF) 4 MG/ML IV SOLN
4.0000 mg | Freq: Once | INTRAVENOUS | Status: AC
  Administered 2017-10-07: 4 mg via INTRAVENOUS
  Filled 2017-10-07: qty 1

## 2017-10-07 MED ORDER — PANTOPRAZOLE SODIUM 40 MG IV SOLR
40.0000 mg | Freq: Once | INTRAVENOUS | Status: AC
  Administered 2017-10-07: 40 mg via INTRAVENOUS
  Filled 2017-10-07: qty 40

## 2017-10-07 MED ORDER — IOPAMIDOL (ISOVUE-300) INJECTION 61%
INTRAVENOUS | Status: AC
  Administered 2017-10-07: 75 mL via INTRAVENOUS
  Filled 2017-10-07: qty 75

## 2017-10-07 NOTE — ED Notes (Signed)
Patient transported to X-ray 

## 2017-10-07 NOTE — ED Notes (Signed)
Patient transported to Ultrasound 

## 2017-10-07 NOTE — ED Provider Notes (Signed)
[facility] EMERGENCY DEPARTMENT Provider Note   CSN: [id number] Arrival date & time: 10/07/17  1349     History   Chief Complaint Chief Complaint  Patient presents with  . Abdominal Pain    HPI Elizabeth Mclean is a 62 y.o. female.  HPI   Elizabeth Mclean is a 62 y.o. female, with a history of DM, hepatitis C, hepatic cirrhosis, GERD, and HTN, presenting to the ED with epigastric pain beginning around 1 week ago. Accompanied by vomiting and diarrhea. Constant, worse with eating, especially spicy foods, 9/10, nonradiating.  States her emesis is dark, "like [employer]" and that her stools are dark as well.  Also endorses peripheral edema, abdominal distention, and some shortness of breath with exertion. Denies fever/chills, chest pain, cough, difficulty urinating, dysuria/hematuria, hematemesis, hematochezia, orthopnea, or any other complaints.  Followed by Dr. Benson Norway for her Hep C.    Past Medical History:  Diagnosis Date  . Allergy   . Arthritis    hips, shoulders, face  . Bilateral swelling of feet   . Bone spur of foot   . Breast cancer ([place]) 2011  . Cancer [facility]) 2011   Left Breast cancer  . Chronic pain   . Depression   . Diabetes mellitus without complication ([place])   . GERD (gastroesophageal reflux disease)   . Hepatitis C   . Hypertension   . Insomnia   . Leg cramps   . Personal history of radiation therapy 2011   Left Breast Cancer    Patient Active Problem List   Diagnosis Date Noted  . Liver cirrhosis ([place]) 06/17/2017  . Menopausal bleeding 05/15/2016  . 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 HYPERTENSION, BENIGN ESSENTIAL, CONTROLLED 05/21/2007  . COCAINE ABUSE 05/15/2007  . ALLERGIC RHINITIS 05/15/2007  . POSTMENOPAUSAL STATUS 05/15/2007  . 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 BREAST LUMPECTOMY Left 2011  . BREAST SURGERY    . COLONOSCOPY    . epidural steroid injection    . HYSTEROSCOPY W/D&C N/A 05/15/2016   Procedure: DILATATION AND CURETTAGE /HYSTEROSCOPY;  Surgeon: Lavonia Drafts, MD;  Location: [facility];  Service: Gynecology;  Laterality: N/A;  . MOUTH SURGERY    . THUMB SURGERY      OB History    Gravida Para Term Preterm AB Living   5 4     1 3    SAB TAB Ectopic Multiple Live Births     1     4       Home Medications    Prior to Admission medications   Medication Sig Start Date End Date Taking? Authorizing Provider  Alpha-D-Galactosidase Satira Mccallum) TABS Take 1-2 tablets by mouth daily as needed (to prevent gas with certain foods).   Yes [provider]  Ascorbic Acid (VITAMIN C PO) Take 1 tablet by mouth daily.   Yes [provider]  aspirin-sod bicarb-citric acid (ALKA-SELTZER) 325 MG TBEF tablet Take 325 mg by mouth every 6 (six) hours as needed (for indigestion).   Yes [provider]  ferrous sulfate 325 (65 FE) MG tablet Take 325 mg by mouth daily with breakfast.   Yes [provider]  furosemide (LASIX) 40 MG tablet Take 20-40 mg by mouth See admin instructions. 40 mg by mouth in the morning and 20 mg at bedtime   Yes  [provider]  hydrOXYzine (ATARAX/VISTARIL) 25 MG tablet Take 25 mg by mouth at bedtime as needed for itching 08/23/17  Yes [provider]  lactulose (CHRONULAC) 10 GM/15ML solution Take 30 mLs by mouth 3 (three) times daily. Hold dose for more than 4 loose bowel movements per 24hr. 06/11/17  Yes [provider]  levonorgestrel (MIRENA, 52 MG,) 20 MCG/24HR IUD 1 each by Intrauterine route once.   Yes [provider]  megestrol (MEGACE) 40 MG tablet Take 1 tablet (40 mg total) by mouth 2 (two) times daily. Patient taking differently: Take 40 mg by mouth daily.  09/05/17  Yes Degele, [name], MD  potassium chloride SA (K-DUR,KLOR-CON) 20 MEQ tablet  Take 20 mEq by mouth daily.   Yes [provider]  RA VITAMIN D-3 2000 units CAPS Take 2,000 Units by mouth daily.  01/09/16  Yes [provider]  rifaximin (XIFAXAN) 550 MG TABS tablet Take 550 mg by mouth 2 (two) times daily.   Yes [provider]  spironolactone (ALDACTONE) 100 MG tablet Take 1 tablet (100 mg total) by mouth daily. 06/21/17  Yes Patwardhan, Reynold Bowen, MD  traMADol (ULTRAM) 50 MG tablet Take 50 mg by mouth every 8 hours as needed for pain 09/15/17  Yes [provider]  VESICARE 5 MG tablet Take 5 mg by mouth daily. 02/08/16  Yes [provider]    Family History Family History  Problem Relation Age of Onset  . Diabetes Mother   . Diabetes Father   . Hypertension Other     Social History Social History   Tobacco Use  . Smoking status: Former Research scientist (life sciences)  . Smokeless tobacco: Never Used  Substance Use Topics  . Alcohol use: No  . Drug use: No     Allergies   Aspirin; Fexofenadine; and Hydrocodone   Review of Systems Review of Systems  Constitutional: Negative for chills, diaphoresis and fever.  Respiratory: Positive for shortness of breath. Negative for cough.   Cardiovascular: Positive for leg swelling. Negative for chest pain.  Gastrointestinal: Positive for abdominal distention, abdominal pain, diarrhea, nausea and vomiting.       Dark stools and dark colored emesis  Genitourinary: Negative for dysuria and hematuria.  All other systems reviewed and are negative.    Physical Exam Updated Vital Signs BP (!) 134/44   Pulse 90   Temp 98.2 F (36.8 C) (Oral)   Resp 17   Ht 5\' 7"  (1.702 m)   Wt 117.9 kg (260 lb)   LMP 06/04/2017 (LMP Unknown)   SpO2 100%   BMI 40.72 kg/m   Physical Exam  Constitutional: She appears well-developed and well-nourished. No distress.  HENT:  Head: Normocephalic and atraumatic.  Eyes: Conjunctivae are normal.  Neck: Neck supple.  Cardiovascular: Normal rate, regular rhythm,  normal heart sounds and intact distal pulses.  Pulmonary/Chest: Effort normal and breath sounds normal. No respiratory distress.  Abdominal: Soft. Bowel sounds are normal. She exhibits distension. There is tenderness in the epigastric area and left upper quadrant. There is no guarding.  Genitourinary: Rectal exam shows guaiac positive stool.  Genitourinary Comments: No external hemorrhoids, fissures, or lesions noted. No gross blood or stool burden. No rectal tenderness. PA student, Lilia Pro, served as chaperone during the rectal exam.  Musculoskeletal: She exhibits edema.  Significant peripheral edema  Lymphadenopathy:    She has no cervical adenopathy.  Neurological: She is alert.  Skin: Skin is warm and dry. She is not diaphoretic.  Psychiatric:  She has a normal mood and affect. Her behavior is normal.  Nursing note and vitals reviewed.    ED Treatments / Results  Labs (all labs ordered are listed, but only abnormal results are displayed) Labs Reviewed  LIPASE, BLOOD - Abnormal; Notable for the following components:      Result Value   Lipase 55 (*)    All other components within normal limits  COMPREHENSIVE METABOLIC PANEL - Abnormal; Notable for the following components:   Sodium 132 (*)    CO2 20 (*)    Glucose, Bld 133 (*)    BUN 37 (*)    Creatinine, Ser 1.66 (*)    Calcium 8.0 (*)    Albumin 2.0 (*)    AST 166 (*)    ALT 79 (*)    Total Bilirubin 4.6 (*)    GFR calc non Af Amer 32 (*)    GFR calc Af Amer 37 (*)    All other components within normal limits  CBC - Abnormal; Notable for the following components:   WBC 12.2 (*)    RBC 3.01 (*)    Hemoglobin 10.0 (*)    HCT 28.0 (*)    RDW 17.3 (*)    Platelets 74 (*)    All other components within normal limits  POC OCCULT BLOOD, ED - Abnormal; Notable for the following components:   Fecal Occult Bld POSITIVE (*)    All other components within normal limits  BRAIN NATRIURETIC PEPTIDE  URINALYSIS, ROUTINE W REFLEX  MICROSCOPIC  I-STAT TROPONIN, ED   BUN  Date Value Ref Range Status  10/07/2017 37 (H) 6 - 20 mg/dL Final  09/05/2017 28 (H) 6 - 20 mg/dL Final  06/21/2017 27 (H) 6 - 20 mg/dL Final  06/20/2017 28 (H) 6 - 20 mg/dL Final  07/22/2014 10.2 7.0 - 26.0 mg/dL Final  04/22/2014 12.8 7.0 - 26.0 mg/dL Final  10/22/2013 11.3 7.0 - 26.0 mg/dL Final  01/26/2013 9.9 7.0 - 26.0 mg/dL Final   Creatinine  Date Value Ref Range Status  07/22/2014 0.8 0.6 - 1.1 mg/dL Final  04/22/2014 0.9 0.6 - 1.1 mg/dL Final  10/22/2013 0.9 0.6 - 1.1 mg/dL Final  01/26/2013 0.7 0.6 - 1.1 mg/dL Final   Creatinine, Ser  Date Value Ref Range Status  10/07/2017 1.66 (H) 0.44 - 1.00 mg/dL Final  09/05/2017 1.15 (H) 0.44 - 1.00 mg/dL Final  06/21/2017 1.49 (H) 0.44 - 1.00 mg/dL Final  06/20/2017 1.65 (H) 0.44 - 1.00 mg/dL Final    Hemoglobin  Date Value Ref Range Status  10/07/2017 10.0 (L) 12.0 - 15.0 g/dL Final  09/05/2017 9.9 (L) 12.0 - 15.0 g/dL Final  06/21/2017 9.4 (L) 12.0 - 15.0 g/dL Final  06/19/2017 8.9 (L) 12.0 - 15.0 g/dL Final   HGB  Date Value Ref Range Status  07/22/2014 12.5 11.6 - 15.9 g/dL Final  04/22/2014 12.4 11.6 - 15.9 g/dL Final  10/22/2013 12.5 11.6 - 15.9 g/dL Final  01/26/2013 13.0 11.6 - 15.9 g/dL Final   ALT  Date Value Ref Range Status  10/07/2017 79 (H) 14 - 54 U/L Final  09/05/2017 74 (H) 14 - 54 U/L Final  06/21/2017 62 (H) 14 - 54 U/L Final  06/17/2017 86 (H) 14 - 54 U/L Final  07/22/2014 112 (H) 0 - 55 U/L Final  04/22/2014 87 (H) 0 - 55 U/L Final  10/22/2013 78 (H) 0 - 55 U/L Final  01/26/2013 84 (H) 0 - 55 U/L Final  AST  Date Value Ref Range Status  10/07/2017 166 (H) 15 - 41 U/L Final  09/05/2017 156 (H) 15 - 41 U/L Final  06/21/2017 112 (H) 15 - 41 U/L Final  06/17/2017 159 (H) 15 - 41 U/L Final  07/22/2014 130 (H) 5 - 34 U/L Final  04/22/2014 109 (H) 5 - 34 U/L Final  10/22/2013 104 (H) 5 - 34 U/L Final  01/26/2013 100 (H) 5 - 34 U/L Final      EKG  EKG Interpretation None       Radiology Dg Chest 2 View  Result Date: 10/07/2017 CLINICAL DATA:  Short of breath EXAM: CHEST  2 VIEW COMPARISON:  12/13/2013 FINDINGS: Low lung volumes. No focal consolidation or pleural effusion. Cardiomediastinal silhouette within normal limits. Aortic atherosclerosis. No pneumothorax. IMPRESSION: No active cardiopulmonary disease.  Low lung volumes. Electronically Signed   By: Donavan Foil M.D.   On: 10/07/2017 23:15   Ct Abdomen Pelvis W Contrast  Result Date: 10/07/2017 CLINICAL DATA:  Epigastric abdominal pain EXAM: CT ABDOMEN AND PELVIS WITH CONTRAST TECHNIQUE: Multidetector CT imaging of the abdomen and pelvis was performed using the standard protocol following bolus administration of intravenous contrast. CONTRAST:  75 cc Isovue 300 intravenous COMPARISON:  CT abdomen pelvis 02/21/2016 FINDINGS: Lower chest: Lung bases demonstrate no acute consolidation or effusion. Borderline cardiomegaly. Hepatobiliary: Cirrhotic liver. Contracted gallbladder. No biliary dilatation. Pancreas: No ductal dilatation. Fluid around the pancreas likely related to generalized ascites. Spleen: Normal in size without focal abnormality. Adrenals/Urinary Tract: Adrenal glands are within normal limits. Stable cyst in the upper pole of the left kidney. The bladder is unremarkable Stomach/Bowel: The stomach is nonenlarged. Horizontal portion of the duodenum is fuzzy and indistinct with possible wall thickening. No colon wall thickening. Normal appendix Vascular/Lymphatic: Extensive aortic atherosclerosis. No aneurysmal dilatation. Tortuous distal esophageal and perigastric vessels consistent with varices. No grossly enlarged lymph nodes Reproductive: Uterus and bilateral adnexa are unremarkable. Intrauterine device is present Other: Moderate to large volume of ascites in the abdomen and pelvis. Generalized edema within the left greater than right body wall. No free air.  Musculoskeletal: Degenerative changes. No acute or suspicious lesion IMPRESSION: 1. Cirrhotic morphology of the liver with moderate to large volume of ascites in the abdomen and pelvis. Multiple tortuous distal esophageal and perigastric vessels consistent with varices 2. Indistinct appearance of the third portion of duodenum with mild wall thickening, suggestive of duodenitis. Ascites fluid around the pancreas, suspect that this is related to generalized ascites as opposed to pancreatitis 3. Moderate to large amount of subcutaneous edema and fluid within the left greater than right flank. Electronically Signed   By: Donavan Foil M.D.   On: 10/07/2017 22:01   US Abdomen Limited Ruq  Result Date: 10/07/2017 CLINICAL DATA:  Initial evaluation for acute right upper quadrant pain. EXAM: ULTRASOUND ABDOMEN LIMITED RIGHT UPPER QUADRANT COMPARISON:  Prior CT from earlier the same day. FINDINGS: Gallbladder: Gallbladder contracted. No echogenic stones identified. Wall is thickened up to 7.2 mm, like related incomplete distension and/or hepatic cirrhosis with ascites. Positive sonographic Murphy sign elicited on exam. Common bile duct: Diameter: 4.8 mm Liver: Liver demonstrates a coarse echogenic echotexture with nodular contour, compatible with cirrhosis. No discrete hepatic masses. Portal vein is patent on color Doppler imaging with hepatofugal blood flow away from the liver. Moderate volume ascites noted within the partially visualized abdomen. IMPRESSION: 1. Hepatic cirrhosis with moderate volume ascites and hepatofugal portal venous flow. 2. Contracted gallbladder without cholelithiasis or biliary  dilatation. Gallbladder wall thickening felt to be related to incomplete distension and/or ascites/cirrhosis. Positive sonographic Percell Miller sign was elicited on exam. Electronically Signed   By: Jeannine Boga M.D.   On: 10/07/2017 22:27    Procedures Procedures (including critical care time)  Medications  Ordered in ED Medications  iopamidol (ISOVUE-300) 61 % injection (75 mLs Intravenous Contrast Given 10/07/17 2124)  pantoprazole (PROTONIX) injection 40 mg (40 mg Intravenous Given 10/07/17 2239)  morphine 4 MG/ML injection 4 mg (4 mg Intravenous Given 10/07/17 2239)  ondansetron (ZOFRAN) injection 4 mg (4 mg Intravenous Given 10/07/17 2239)     Initial Impression / Assessment and Plan / ED Course  I have reviewed the triage vital signs and the nursing notes.  Pertinent labs & imaging results that were available during my care of the patient were reviewed by me and considered in my medical decision making (see chart for details).  Clinical Course as of Oct 08 208  Mon Oct 07, 2017  2319 Spoke with Dr. Benson Norway, gastroenterologist.  Clarnce Flock her in office today and sent her to the ED due to worsening ascites and symptoms. Recommends therapeutic paracentesis and HIDA scan during admission. He will consult on patient. When she can eat, she will need to be on a 2g sodium restriction on her diet.  [SJ]  Tue Oct 08, 2017  0037 Spoke with Dr. Alcario Drought, hospitalist. Agrees to admit the patient.  [SJ]    Clinical Course User Index [SJ] Ylianna Almanzar C, PA-C    Patient presents with abdominal pain, vomiting, and dark stools.  Evidence of ascites and possible wall thickening of the duodenum noted on CT.  Positive Hemoccult.  Evidence of decompensated cirrhosis.  Admission for further workup.  Findings and plan of care discussed with Milton Ferguson, MD. Dr. Roderic Palau personally evaluated and examined this patient.    Final Clinical Impressions(s) / ED Diagnoses   Final diagnoses:  RUQ pain  Non-intractable vomiting with nausea, unspecified vomiting type  Gastrointestinal hemorrhage, unspecified gastrointestinal hemorrhage type    ED Discharge Orders    None       Layla Maw 10/08/17 [id number]    Milton Ferguson, MD 10/08/17 1622

## 2017-10-07 NOTE — ED Triage Notes (Signed)
Pt in c/o abd pain in the epigastric area, pt c/o indigestion, pt reports vomiting for the last 3 days, last emesis yesterday, pt reports pain worsening with eating, pt reports feeling faint, pt c/o generalized weakness, pt c/o diarrhea once today, A&O x4

## 2017-10-07 NOTE — ED Notes (Signed)
ED Provider at bedside. 

## 2017-10-08 ENCOUNTER — Observation Stay (HOSPITAL_COMMUNITY): Payer: Medicaid Other

## 2017-10-08 ENCOUNTER — Encounter (HOSPITAL_COMMUNITY): Payer: Self-pay | Admitting: General Surgery

## 2017-10-08 DIAGNOSIS — R1011 Right upper quadrant pain: Secondary | ICD-10-CM | POA: Diagnosis not present

## 2017-10-08 DIAGNOSIS — R112 Nausea with vomiting, unspecified: Secondary | ICD-10-CM

## 2017-10-08 DIAGNOSIS — R195 Other fecal abnormalities: Secondary | ICD-10-CM | POA: Diagnosis not present

## 2017-10-08 DIAGNOSIS — K922 Gastrointestinal hemorrhage, unspecified: Secondary | ICD-10-CM

## 2017-10-08 DIAGNOSIS — R188 Other ascites: Secondary | ICD-10-CM | POA: Diagnosis not present

## 2017-10-08 DIAGNOSIS — R601 Generalized edema: Secondary | ICD-10-CM | POA: Diagnosis not present

## 2017-10-08 DIAGNOSIS — D696 Thrombocytopenia, unspecified: Secondary | ICD-10-CM | POA: Diagnosis present

## 2017-10-08 DIAGNOSIS — K7460 Unspecified cirrhosis of liver: Secondary | ICD-10-CM | POA: Diagnosis not present

## 2017-10-08 HISTORY — PX: IR PARACENTESIS: IMG2679

## 2017-10-08 LAB — BODY FLUID CELL COUNT WITH DIFFERENTIAL
EOS FL: 1 %
LYMPHS FL: 63 %
MONOCYTE-MACROPHAGE-SEROUS FLUID: 26 % — AB (ref 50–90)
NEUTROPHIL FLUID: 10 % (ref 0–25)
Total Nucleated Cell Count, Fluid: 96 cu mm (ref 0–1000)

## 2017-10-08 LAB — COMPREHENSIVE METABOLIC PANEL
ALBUMIN: 1.9 g/dL — AB (ref 3.5–5.0)
ALK PHOS: 104 U/L (ref 38–126)
ALT: 75 U/L — ABNORMAL HIGH (ref 14–54)
ANION GAP: 10 (ref 5–15)
AST: 151 U/L — ABNORMAL HIGH (ref 15–41)
BUN: 41 mg/dL — ABNORMAL HIGH (ref 6–20)
CALCIUM: 8.2 mg/dL — AB (ref 8.9–10.3)
CHLORIDE: 105 mmol/L (ref 101–111)
CO2: 18 mmol/L — AB (ref 22–32)
Creatinine, Ser: 1.79 mg/dL — ABNORMAL HIGH (ref 0.44–1.00)
GFR calc non Af Amer: 29 mL/min — ABNORMAL LOW (ref >60)
GFR, EST AFRICAN AMERICAN: 34 mL/min — AB (ref >60)
GLUCOSE: 86 mg/dL (ref 65–99)
POTASSIUM: 5.3 mmol/L — AB (ref 3.5–5.1)
SODIUM: 133 mmol/L — AB (ref 135–145)
Total Bilirubin: 4.4 mg/dL — ABNORMAL HIGH (ref 0.3–1.2)
Total Protein: 6.7 g/dL (ref 6.5–8.1)

## 2017-10-08 LAB — GRAM STAIN

## 2017-10-08 LAB — CBC
HCT: 27 % — ABNORMAL LOW (ref 36.0–46.0)
Hemoglobin: 9.8 g/dL — ABNORMAL LOW (ref 12.0–15.0)
MCH: 33.4 pg (ref 26.0–34.0)
MCHC: 36.3 g/dL — ABNORMAL HIGH (ref 30.0–36.0)
MCV: 92.2 fL (ref 78.0–100.0)
PLATELETS: UNDETERMINED 10*3/uL (ref 150–400)
RBC: 2.93 MIL/uL — AB (ref 3.87–5.11)
RDW: 17.7 % — AB (ref 11.5–15.5)
WBC: 11.9 10*3/uL — AB (ref 4.0–10.5)

## 2017-10-08 LAB — URINALYSIS, ROUTINE W REFLEX MICROSCOPIC
BILIRUBIN URINE: NEGATIVE
Glucose, UA: NEGATIVE mg/dL
HGB URINE DIPSTICK: NEGATIVE
KETONES UR: NEGATIVE mg/dL
Leukocytes, UA: NEGATIVE
Nitrite: NEGATIVE
Protein, ur: NEGATIVE mg/dL
SPECIFIC GRAVITY, URINE: 1.034 — AB (ref 1.005–1.030)
pH: 5 (ref 5.0–8.0)

## 2017-10-08 LAB — CBC WITH DIFFERENTIAL/PLATELET
BASOS ABS: 0 10*3/uL (ref 0.0–0.1)
BASOS PCT: 0 %
EOS PCT: 0 %
Eosinophils Absolute: 0 10*3/uL (ref 0.0–0.7)
HCT: 29 % — ABNORMAL LOW (ref 36.0–46.0)
Hemoglobin: 10.4 g/dL — ABNORMAL LOW (ref 12.0–15.0)
LYMPHS PCT: 7 %
Lymphs Abs: 0.8 10*3/uL (ref 0.7–4.0)
MCH: 33.7 pg (ref 26.0–34.0)
MCHC: 35.9 g/dL (ref 30.0–36.0)
MCV: 93.9 fL (ref 78.0–100.0)
MONO ABS: 1.4 10*3/uL — AB (ref 0.1–1.0)
Monocytes Relative: 11 %
Neutro Abs: 10.1 10*3/uL — ABNORMAL HIGH (ref 1.7–7.7)
Neutrophils Relative %: 82 %
PLATELETS: 112 10*3/uL — AB (ref 150–400)
RBC: 3.09 MIL/uL — ABNORMAL LOW (ref 3.87–5.11)
RDW: 17.9 % — AB (ref 11.5–15.5)
WBC: 12.3 10*3/uL — ABNORMAL HIGH (ref 4.0–10.5)

## 2017-10-08 LAB — GLUCOSE, PLEURAL OR PERITONEAL FLUID: Glucose, Fluid: 105 mg/dL

## 2017-10-08 LAB — PROTEIN, PLEURAL OR PERITONEAL FLUID: Total protein, fluid: <3 g/dL

## 2017-10-08 LAB — ALBUMIN, PLEURAL OR PERITONEAL FLUID: Albumin, Fluid: <1 g/dL

## 2017-10-08 LAB — HEMOGLOBIN AND HEMATOCRIT, BLOOD
HEMATOCRIT: 27.1 % — AB (ref 36.0–46.0)
HEMATOCRIT: 28.1 % — AB (ref 36.0–46.0)
HEMOGLOBIN: 9.9 g/dL — AB (ref 12.0–15.0)
Hemoglobin: 10.2 g/dL — ABNORMAL LOW (ref 12.0–15.0)

## 2017-10-08 LAB — LACTATE DEHYDROGENASE, PLEURAL OR PERITONEAL FLUID: LD FL: 93 U/L — AB (ref 3–23)

## 2017-10-08 LAB — AMMONIA: AMMONIA: 20 umol/L (ref 9–35)

## 2017-10-08 LAB — PHOSPHORUS: PHOSPHORUS: 3.9 mg/dL (ref 2.5–4.6)

## 2017-10-08 LAB — MAGNESIUM: MAGNESIUM: 2 mg/dL (ref 1.7–2.4)

## 2017-10-08 MED ORDER — MORPHINE SULFATE (PF) 4 MG/ML IV SOLN
2.0000 mg | INTRAVENOUS | Status: DC | PRN
  Administered 2017-10-09: 2 mg via INTRAVENOUS
  Administered 2017-10-11: 4 mg via INTRAVENOUS
  Filled 2017-10-08 (×3): qty 1

## 2017-10-08 MED ORDER — TECHNETIUM TC 99M MEBROFENIN IV KIT
5.0000 | PACK | Freq: Once | INTRAVENOUS | Status: AC | PRN
  Administered 2017-10-08: 5 via INTRAVENOUS

## 2017-10-08 MED ORDER — SPIRONOLACTONE 25 MG PO TABS
100.0000 mg | ORAL_TABLET | Freq: Every day | ORAL | Status: DC
  Administered 2017-10-08 – 2017-10-09 (×2): 100 mg via ORAL
  Filled 2017-10-08: qty 1
  Filled 2017-10-08 (×2): qty 4

## 2017-10-08 MED ORDER — POTASSIUM CHLORIDE CRYS ER 20 MEQ PO TBCR
20.0000 meq | EXTENDED_RELEASE_TABLET | Freq: Every day | ORAL | Status: DC
  Administered 2017-10-08: 20 meq via ORAL
  Filled 2017-10-08: qty 1

## 2017-10-08 MED ORDER — ACETAMINOPHEN 325 MG PO TABS
650.0000 mg | ORAL_TABLET | Freq: Four times a day (QID) | ORAL | Status: DC | PRN
  Administered 2017-10-10: 650 mg via ORAL
  Filled 2017-10-08 (×2): qty 2

## 2017-10-08 MED ORDER — FUROSEMIDE 20 MG PO TABS: 20.0000 mg | ORAL_TABLET | ORAL | Status: DC

## 2017-10-08 MED ORDER — LIDOCAINE 2% (20 MG/ML) 5 ML SYRINGE
INTRAMUSCULAR | Status: AC
  Filled 2017-10-08: qty 10

## 2017-10-08 MED ORDER — VITAMIN D 1000 UNITS PO TABS
2000.0000 [IU] | ORAL_TABLET | Freq: Every day | ORAL | Status: DC
  Administered 2017-10-08 – 2017-10-15 (×8): 2000 [IU] via ORAL
  Filled 2017-10-08 (×9): qty 2

## 2017-10-08 MED ORDER — HYDROXYZINE HCL 25 MG PO TABS: 25.0000 mg | ORAL_TABLET | Freq: Three times a day (TID) | ORAL | Status: DC | PRN

## 2017-10-08 MED ORDER — LIDOCAINE HCL (PF) 1 % IJ SOLN
INTRAMUSCULAR | Status: DC | PRN
  Administered 2017-10-08: 8 mL

## 2017-10-08 MED ORDER — RIFAXIMIN 550 MG PO TABS
550.0000 mg | ORAL_TABLET | Freq: Two times a day (BID) | ORAL | Status: DC
  Administered 2017-10-08 – 2017-10-15 (×14): 550 mg via ORAL
  Filled 2017-10-08 (×16): qty 1

## 2017-10-08 MED ORDER — FUROSEMIDE 20 MG PO TABS: 20.0000 mg | ORAL_TABLET | Freq: Every day | ORAL | Status: DC

## 2017-10-08 MED ORDER — LACTULOSE 10 GM/15ML PO SOLN
20.0000 g | Freq: Three times a day (TID) | ORAL | Status: DC
  Administered 2017-10-08 – 2017-10-09 (×2): 20 g via ORAL
  Filled 2017-10-08 (×4): qty 30

## 2017-10-08 MED ORDER — ACETAMINOPHEN 650 MG RE SUPP: 650.0000 mg | Freq: Four times a day (QID) | RECTAL | Status: DC | PRN

## 2017-10-08 MED ORDER — ONDANSETRON HCL 4 MG PO TABS
4.0000 mg | ORAL_TABLET | Freq: Four times a day (QID) | ORAL | Status: DC | PRN
  Administered 2017-10-11: 4 mg via ORAL
  Filled 2017-10-08: qty 1

## 2017-10-08 MED ORDER — DARIFENACIN HYDROBROMIDE ER 7.5 MG PO TB24
7.5000 mg | ORAL_TABLET | Freq: Every day | ORAL | Status: DC
  Administered 2017-10-08 – 2017-10-15 (×7): 7.5 mg via ORAL
  Filled 2017-10-08 (×8): qty 1

## 2017-10-08 MED ORDER — FERROUS SULFATE 325 (65 FE) MG PO TABS
325.0000 mg | ORAL_TABLET | Freq: Every day | ORAL | Status: DC
  Administered 2017-10-08 – 2017-10-15 (×8): 325 mg via ORAL
  Filled 2017-10-08 (×7): qty 1

## 2017-10-08 MED ORDER — ONDANSETRON HCL 4 MG/2ML IJ SOLN: 4.0000 mg | Freq: Four times a day (QID) | INTRAMUSCULAR | Status: DC | PRN

## 2017-10-08 MED ORDER — MORPHINE SULFATE (PF) 4 MG/ML IV SOLN
3.0000 mg | Freq: Once | INTRAVENOUS | Status: AC
  Administered 2017-10-08: 3 mg via INTRAVENOUS

## 2017-10-08 MED ORDER — FUROSEMIDE 40 MG PO TABS
40.0000 mg | ORAL_TABLET | Freq: Every day | ORAL | Status: DC
  Administered 2017-10-08 – 2017-10-11 (×4): 40 mg via ORAL
  Filled 2017-10-08 (×3): qty 1

## 2017-10-08 NOTE — Progress Notes (Signed)
This nurse assumed care of patient, pt awake alert laying in bed reading newspaper.  [facility] called for pt to have HIDA Scan. Pt given info on procedure, questions answered, therapeutic listening and touch provided.

## 2017-10-08 NOTE — ED Notes (Signed)
Pharmacy messaged about unverified medicines.

## 2017-10-08 NOTE — ED Notes (Signed)
Admitting at bedside 

## 2017-10-08 NOTE — ED Notes (Signed)
ED Provider at bedside. 

## 2017-10-08 NOTE — Progress Notes (Signed)
Transporters x 1 at bedside to take pt to [facility].  [id number]

## 2017-10-08 NOTE — Consult Note (Addendum)
Reason for Consult: Decompensated cirrhosis Referring Physician: Stanley HPI: This is a 62 year old female with a PMH of HCV cirrhosis, left breast cancer, HTN, and arthritis admitted for decompensated cirrhosis.  Over the Christmas holiday she was noncompliant with her dietary salt intake.  This resulted in a 40 weight gain.  She was admitted in the hospital last year for fluid overload that was thought to be cardiac in origin, however, it was later determined that she had cirrhosis with decompensation.  Aggressive diuresis was performed and she slowly improved, but her creatinine elevated.  Once her creatinine trended downwards she was followed up closely as an outpatient.  With 20 mg of lasix and 100 mg of spironolactone and a very strict sodium restriction she was able to drop her weight down into the 230-240 lbs range.  The plan was then to pursue treatment for her HCV, but then she was admittedly lax with her salt intake.  Her weight increased tremendously up to 295 lbs.  Attempts to manage her situation as an outpatient were pursued, but it was clear that she was not improving.  She wanted to avoid hospitalization, but every week she returned in a tearful state about her fluid overload.  Yesterday she acquiesced to being admitted.  Past Medical History:  Diagnosis Date  . Allergy   . Arthritis    hips, shoulders, face  . Bilateral swelling of feet   . Bone spur of foot   . Breast cancer ([place]) 2011  . Cancer [facility]) 2011   Left Breast cancer  . Chronic pain   . Depression   . Diabetes mellitus without complication ([place])   . GERD (gastroesophageal reflux disease)   . Hepatitis C   . Hypertension   . Insomnia   . Leg cramps   . Personal history of radiation therapy 2011   Left Breast Cancer    Past Surgical History:  Procedure Laterality Date  . BREAST LUMPECTOMY Left 2011  . BREAST SURGERY    . COLONOSCOPY    . epidural steroid injection    .  HYSTEROSCOPY W/D&C N/A 05/15/2016   Procedure: DILATATION AND CURETTAGE /HYSTEROSCOPY;  Surgeon: Lavonia Drafts, MD;  Location: [place] [facility] [facility];  Service: Gynecology;  Laterality: N/A;  . MOUTH SURGERY    . THUMB SURGERY      Family History  Problem Relation Age of Onset  . Diabetes Mother   . Diabetes Father   . Hypertension Other     Social History:  reports that she has quit smoking. she has never used smokeless tobacco. She reports that she does not drink alcohol or use drugs.  Allergies:  Allergies  Allergen Reactions  . Aspirin Other (See Comments)    Stomach aches   . Fexofenadine Other (See Comments)    Causes leg and back pain  . Hydrocodone Itching    Medications:  Scheduled: . cholecalciferol  2,000 Units Oral Daily  . darifenacin  7.5 mg Oral Daily  . ferrous sulfate  325 mg Oral Q breakfast  . furosemide  40 mg Oral Daily   And  . furosemide  20 mg Oral q1800  . lactulose  20 g Oral TID  . potassium chloride SA  20 mEq Oral Daily  . rifaximin  550 mg Oral BID  . spironolactone  100 mg Oral Daily   Continuous:   Results for orders placed or performed during the hospital encounter of 10/07/17 (from the past 24 hour(s))  Lipase, blood     Status: Abnormal   Collection Time: 10/07/17  3:12 PM  Result Value Ref Range   Lipase 55 (H) 11 - 51 U/L  Comprehensive metabolic panel     Status: Abnormal   Collection Time: 10/07/17  3:12 PM  Result Value Ref Range   Sodium 132 (L) 135 - 145 mmol/L   Potassium 5.0 3.5 - 5.1 mmol/L   Chloride 104 101 - 111 mmol/L   CO2 20 (L) 22 - 32 mmol/L   Glucose, Bld 133 (H) 65 - 99 mg/dL   BUN 37 (H) 6 - 20 mg/dL   Creatinine, Ser 1.66 (H) 0.44 - 1.00 mg/dL   Calcium 8.0 (L) 8.9 - 10.3 mg/dL   Total Protein 7.0 6.5 - 8.1 g/dL   Albumin 2.0 (L) 3.5 - 5.0 g/dL   AST 166 (H) 15 - 41 U/L   ALT 79 (H) 14 - 54 U/L   Alkaline Phosphatase 120 38 - 126 U/L   Total Bilirubin 4.6 (H) 0.3 - 1.2 mg/dL   GFR calc non Af Amer 32 (L)  >60 mL/min   GFR calc Af Amer 37 (L) >60 mL/min   Anion gap 8 5 - 15  CBC     Status: Abnormal   Collection Time: 10/07/17  3:12 PM  Result Value Ref Range   WBC 12.2 (H) 4.0 - 10.5 K/uL   RBC 3.01 (L) 3.87 - 5.11 MIL/uL   Hemoglobin 10.0 (L) 12.0 - 15.0 g/dL   HCT 28.0 (L) 36.0 - 46.0 %   MCV 93.0 78.0 - 100.0 fL   MCH 33.2 26.0 - 34.0 pg   MCHC 35.7 30.0 - 36.0 g/dL   RDW 17.3 (H) 11.5 - 15.5 %   Platelets 74 (L) 150 - 400 K/uL  Brain natriuretic peptide     Status: None   Collection Time: 10/07/17  8:56 PM  Result Value Ref Range   B Natriuretic Peptide 26.9 0.0 - 100.0 pg/mL  I-stat troponin, ED     Status: None   Collection Time: 10/07/17  9:06 PM  Result Value Ref Range   Troponin i, poc 0.01 0.00 - 0.08 ng/mL   Comment 3          POC occult blood, ED Provider will collect     Status: Abnormal   Collection Time: 10/07/17 10:38 PM  Result Value Ref Range   Fecal Occult Bld POSITIVE (A) NEGATIVE     Dg Chest 2 View  Result Date: 10/07/2017 CLINICAL DATA:  Short of breath EXAM: CHEST  2 VIEW COMPARISON:  12/13/2013 FINDINGS: Low lung volumes. No focal consolidation or pleural effusion. Cardiomediastinal silhouette within normal limits. Aortic atherosclerosis. No pneumothorax. IMPRESSION: No active cardiopulmonary disease.  Low lung volumes. Electronically Signed   By: Donavan Foil M.D.   On: 10/07/2017 23:15   Ct Abdomen Pelvis W Contrast  Result Date: 10/07/2017 CLINICAL DATA:  Epigastric abdominal pain EXAM: CT ABDOMEN AND PELVIS WITH CONTRAST TECHNIQUE: Multidetector CT imaging of the abdomen and pelvis was performed using the standard protocol following bolus administration of intravenous contrast. CONTRAST:  75 cc Isovue 300 intravenous COMPARISON:  CT abdomen pelvis 02/21/2016 FINDINGS: Lower chest: Lung bases demonstrate no acute consolidation or effusion. Borderline cardiomegaly. Hepatobiliary: Cirrhotic liver. Contracted gallbladder. No biliary dilatation. Pancreas:  No ductal dilatation. Fluid around the pancreas likely related to generalized ascites. Spleen: Normal in size without focal abnormality. Adrenals/Urinary Tract: Adrenal glands are within normal limits. Stable cyst in  the upper pole of the left kidney. The bladder is unremarkable Stomach/Bowel: The stomach is nonenlarged. Horizontal portion of the duodenum is fuzzy and indistinct with possible wall thickening. No colon wall thickening. Normal appendix Vascular/Lymphatic: Extensive aortic atherosclerosis. No aneurysmal dilatation. Tortuous distal esophageal and perigastric vessels consistent with varices. No grossly enlarged lymph nodes Reproductive: Uterus and bilateral adnexa are unremarkable. Intrauterine device is present Other: Moderate to large volume of ascites in the abdomen and pelvis. Generalized edema within the left greater than right body wall. No free air. Musculoskeletal: Degenerative changes. No acute or suspicious lesion IMPRESSION: 1. Cirrhotic morphology of the liver with moderate to large volume of ascites in the abdomen and pelvis. Multiple tortuous distal esophageal and perigastric vessels consistent with varices 2. Indistinct appearance of the third portion of duodenum with mild wall thickening, suggestive of duodenitis. Ascites fluid around the pancreas, suspect that this is related to generalized ascites as opposed to pancreatitis 3. Moderate to large amount of subcutaneous edema and fluid within the left greater than right flank. Electronically Signed   By: Donavan Foil M.D.   On: 10/07/2017 22:01   US Abdomen Limited Ruq  Result Date: 10/07/2017 CLINICAL DATA:  Initial evaluation for acute right upper quadrant pain. EXAM: ULTRASOUND ABDOMEN LIMITED RIGHT UPPER QUADRANT COMPARISON:  Prior CT from earlier the same day. FINDINGS: Gallbladder: Gallbladder contracted. No echogenic stones identified. Wall is thickened up to 7.2 mm, like related incomplete distension and/or hepatic cirrhosis  with ascites. Positive sonographic Murphy sign elicited on exam. Common bile duct: Diameter: 4.8 mm Liver: Liver demonstrates a coarse echogenic echotexture with nodular contour, compatible with cirrhosis. No discrete hepatic masses. Portal vein is patent on color Doppler imaging with hepatofugal blood flow away from the liver. Moderate volume ascites noted within the partially visualized abdomen. IMPRESSION: 1. Hepatic cirrhosis with moderate volume ascites and hepatofugal portal venous flow. 2. Contracted gallbladder without cholelithiasis or biliary dilatation. Gallbladder wall thickening felt to be related to incomplete distension and/or ascites/cirrhosis. Positive sonographic Percell Miller sign was elicited on exam. Electronically Signed   By: Jeannine Boga M.D.   On: 10/07/2017 22:27    ROS:  As stated above in the HPI otherwise negative.  Blood pressure (!) 166/57, pulse 87, temperature 98 F (36.7 C), temperature source Oral, resp. rate 14, height 5\' 7"  (1.702 m), weight 117.9 kg (260 lb), last menstrual period 06/04/2017, SpO2 98 %.    PE: Gen: NAD, Alert and Oriented HEENT:  [place]/AT, EOMI Neck: Supple, no LAD Lungs: CTA Bilaterally CV: RRR without M/G/R ABM: Soft, NTND, +BS Ext: No C/C/E  Assessment/Plan: 1) Decompensated cirrhosis. 2) Anasarca. 3) Renal insufficiency secondary to diuretics.   It is unfortunate and frustrating that she has gained all this weight.  She was progressing very well as an outpatient before her salt indiscretion.  The difficulty with managing her situation is her renal status.  Her current creatinine is at 1.6, which was the office value.  The goal is to avoid increasing above 1.8-2.0 as spironolactone will not be effective.  A diagnostic and therapeutic paracentesis is necessary to relieve her fluid overload status and to check for SBP.  There is an elevation in her WBC at 12.0  The CT scan suggests a duodenitis, however, I think this is coming from her  hypoalbuminemic state and ascites.  A RUQ U/S was performed and there is suggestion of a thickened gallbladder, but this again is from the cirrhotic state and ascites.  However, there is a  report of a positive Murphy's sign.    Plan: 1) 2 gram sodium diet. 2) Diagnostic and therapeutic paracentesis.  25 grams of albumin for every 4 liters removed. 3) HIDA scan to evaluate the for the possibility of a cholecystitis. 4) Continue with Step I diuretics - Lasix 40 mg QD and Spironolactone 100 mg QD. 5) Follow creatinine. 6) Daily weights.  Evaleigh Mccamy D 10/08/2017, 7:00 AM

## 2017-10-08 NOTE — H&P (Signed)
History and Physical    Elizabeth Mclean YQM:[id number] DOB: 1955-12-12 DOA: 10/07/2017  PCP: Kerin Perna, NP  Patient coming from: Home  I have personally briefly reviewed patient's old medical records in [place]  Chief Complaint: Abd pain  HPI: Elizabeth Mclean is a 62 y.o. female with medical history significant of HCV, cirrhosis, HTN.  Patient presents to the ED with c/o epigastric / RUQ pain.  Symptoms onset around 1 week ago.  Associated vomiting and diarrhea.  Worse with eating.  9/10, non-radiating.  Dark colored vomit and stools.  Also reports peripheral edema, abdominal distention, and some SOB with exertion.   ED Course: Hemoccult positive, HGB 10.  CT shows ascites, poss wall thickening of duodenum.  US abdomen: positive murphies, contracted gallbladder.  No cholelithiasis or biliary dilation, moderate ascites.     Review of Systems: As per HPI otherwise 10 point review of systems negative.   Past Medical History:  Diagnosis Date  . Allergy   . Arthritis    hips, shoulders, face  . Bilateral swelling of feet   . Bone spur of foot   . Breast cancer ([place]) 2011  . Cancer [facility]) 2011   Left Breast cancer  . Chronic pain   . Depression   . Diabetes mellitus without complication ([place])   . GERD (gastroesophageal reflux disease)   . Hepatitis C   . Hypertension   . Insomnia   . Leg cramps   . Personal history of radiation therapy 2011   Left Breast Cancer    Past Surgical History:  Procedure Laterality Date  . BREAST LUMPECTOMY Left 2011  . BREAST SURGERY    . COLONOSCOPY    . epidural steroid injection    . HYSTEROSCOPY W/D&C N/A 05/15/2016   Procedure: DILATATION AND CURETTAGE /HYSTEROSCOPY;  Surgeon: Lavonia Drafts, MD;  Location: [facility];  Service: Gynecology;  Laterality: N/A;  . MOUTH SURGERY    . THUMB SURGERY       reports that she has quit smoking. she has never used smokeless tobacco. She reports that she does not drink  alcohol or use drugs.  Allergies  Allergen Reactions  . Aspirin Other (See Comments)    Stomach aches   . Fexofenadine Other (See Comments)    Causes leg and back pain  . Hydrocodone Itching    Family History  Problem Relation Age of Onset  . Diabetes Mother   . Diabetes Father   . Hypertension Other      Prior to Admission medications   Medication Sig Start Date End Date Taking? Authorizing Provider  Alpha-D-Galactosidase Satira Mccallum) TABS Take 1-2 tablets by mouth daily as needed (to prevent gas with certain foods).   Yes [provider]  Ascorbic Acid (VITAMIN C PO) Take 1 tablet by mouth daily.   Yes [provider]  aspirin-sod bicarb-citric acid (ALKA-SELTZER) 325 MG TBEF tablet Take 325 mg by mouth every 6 (six) hours as needed (for indigestion).   Yes [provider]  ferrous sulfate 325 (65 FE) MG tablet Take 325 mg by mouth daily with breakfast.   Yes [provider]  furosemide (LASIX) 40 MG tablet Take 20-40 mg by mouth See admin instructions. 40 mg by mouth in the morning and 20 mg at bedtime   Yes [provider]  hydrOXYzine (ATARAX/VISTARIL) 25 MG tablet Take 25 mg by mouth at bedtime as needed for itching 08/23/17  Yes [provider]  lactulose ([place]) 10 GM/15ML  solution Take 30 mLs by mouth 3 (three) times daily. Hold dose for more than 4 loose bowel movements per 24hr. 06/11/17  Yes [provider]  levonorgestrel (MIRENA, 52 MG,) 20 MCG/24HR IUD 1 each by Intrauterine route once.   Yes [provider]  megestrol (MEGACE) 40 MG tablet Take 1 tablet (40 mg total) by mouth 2 (two) times daily. Patient taking differently: Take 40 mg by mouth daily.  09/05/17  Yes Degele, [name], MD  potassium chloride SA (K-DUR,KLOR-CON) 20 MEQ tablet Take 20 mEq by mouth daily.   Yes [provider]  RA VITAMIN D-3 2000 units CAPS Take 2,000 Units by mouth daily.  01/09/16  Yes [provider]    rifaximin (XIFAXAN) 550 MG TABS tablet Take 550 mg by mouth 2 (two) times daily.   Yes [provider]  spironolactone (ALDACTONE) 100 MG tablet Take 1 tablet (100 mg total) by mouth daily. 06/21/17  Yes Patwardhan, Reynold Bowen, MD  traMADol (ULTRAM) 50 MG tablet Take 50 mg by mouth every 8 hours as needed for pain 09/15/17  Yes [provider]  VESICARE 5 MG tablet Take 5 mg by mouth daily. 02/08/16  Yes [provider]    Physical Exam: Vitals:   10/07/17 1742 10/07/17 2055 10/08/17 0008 10/08/17 0131  BP: (!) 134/44 (!) 148/58 (!) 126/51 139/72  Pulse: 90 93 91 86  Resp: 17 15 15 14   Temp:      TempSrc:      SpO2: 100% 100% 95% 94%  Weight:      Height:        Constitutional: NAD, calm, comfortable Eyes: PERRL, lids and conjunctivae normal ENMT: Mucous membranes are moist. Posterior pharynx clear of any exudate or lesions.Normal dentition.  Neck: normal, supple, no masses, no thyromegaly Respiratory: clear to auscultation bilaterally, no wheezing, no crackles. Normal respiratory effort. No accessory muscle use.  Cardiovascular: Regular rate and rhythm, no murmurs / rubs / gallops. No extremity edema. 2+ pedal pulses. No carotid bruits.  Abdomen: Distended, RUQ tenderness Musculoskeletal: no clubbing / cyanosis. No joint deformity upper and lower extremities. Good ROM, no contractures. Normal muscle tone.  Skin: no rashes, lesions, ulcers. No induration Neurologic: CN 2-12 grossly intact. Sensation intact, DTR normal. Strength 5/5 in all 4.  Psychiatric: Normal judgment and insight. Alert and oriented x 3. Normal mood.    Labs on Admission: I have personally reviewed following labs and imaging studies  CBC: Recent Labs  Lab 10/07/17 1512  WBC 12.2*  HGB 10.0*  HCT 28.0*  MCV 93.0  PLT 74*   Basic Metabolic Panel: Recent Labs  Lab 10/07/17 1512  NA 132*  K 5.0  CL 104  CO2 20*  GLUCOSE 133*  BUN 37*  CREATININE 1.66*  CALCIUM 8.0*    GFR: Estimated Creatinine Clearance: 47.2 mL/min (A) (by C-G formula based on SCr of 1.66 mg/dL (H)). Liver Function Tests: Recent Labs  Lab 10/07/17 1512  AST 166*  ALT 79*  ALKPHOS 120  BILITOT 4.6*  PROT 7.0  ALBUMIN 2.0*   Recent Labs  Lab 10/07/17 1512  LIPASE 55*   No results for input(s): AMMONIA in the last 168 hours. Coagulation Profile: No results for input(s): INR, PROTIME in the last 168 hours. Cardiac Enzymes: No results for input(s): CKTOTAL, CKMB, CKMBINDEX, TROPONINI in the last 168 hours. BNP (last 3 results) No results for input(s): PROBNP in the last 8760 hours. HbA1C: No results for input(s): HGBA1C in the  last 72 hours. CBG: No results for input(s): GLUCAP in the last 168 hours. Lipid Profile: No results for input(s): CHOL, HDL, LDLCALC, TRIG, CHOLHDL, LDLDIRECT in the last 72 hours. Thyroid Function Tests: No results for input(s): TSH, T4TOTAL, FREET4, T3FREE, THYROIDAB in the last 72 hours. Anemia Panel: No results for input(s): VITAMINB12, FOLATE, FERRITIN, TIBC, IRON, RETICCTPCT in the last 72 hours. Urine analysis:    Component Value Date/Time   COLORURINE AMBER (A) 09/04/2017 2039   APPEARANCEUR CLEAR 09/04/2017 2039   LABSPEC 1.018 09/04/2017 2039   PHURINE 5.0 09/04/2017 2039   GLUCOSEU NEGATIVE 09/04/2017 2039   HGBUR MODERATE (A) 09/04/2017 2039   BILIRUBINUR NEGATIVE 09/04/2017 2039   KETONESUR NEGATIVE 09/04/2017 2039   PROTEINUR NEGATIVE 09/04/2017 2039   UROBILINOGEN 1.0 12/13/2013 1432   NITRITE NEGATIVE 09/04/2017 2039   LEUKOCYTESUR NEGATIVE 09/04/2017 2039    Radiological Exams on Admission: Dg Chest 2 View  Result Date: 10/07/2017 CLINICAL DATA:  Short of breath EXAM: CHEST  2 VIEW COMPARISON:  12/13/2013 FINDINGS: Low lung volumes. No focal consolidation or pleural effusion. Cardiomediastinal silhouette within normal limits. Aortic atherosclerosis. No pneumothorax. IMPRESSION: No active cardiopulmonary disease.  Low  lung volumes. Electronically Signed   By: Donavan Foil M.D.   On: 10/07/2017 23:15   Ct Abdomen Pelvis W Contrast  Result Date: 10/07/2017 CLINICAL DATA:  Epigastric abdominal pain EXAM: CT ABDOMEN AND PELVIS WITH CONTRAST TECHNIQUE: Multidetector CT imaging of the abdomen and pelvis was performed using the standard protocol following bolus administration of intravenous contrast. CONTRAST:  75 cc Isovue 300 intravenous COMPARISON:  CT abdomen pelvis 02/21/2016 FINDINGS: Lower chest: Lung bases demonstrate no acute consolidation or effusion. Borderline cardiomegaly. Hepatobiliary: Cirrhotic liver. Contracted gallbladder. No biliary dilatation. Pancreas: No ductal dilatation. Fluid around the pancreas likely related to generalized ascites. Spleen: Normal in size without focal abnormality. Adrenals/Urinary Tract: Adrenal glands are within normal limits. Stable cyst in the upper pole of the left kidney. The bladder is unremarkable Stomach/Bowel: The stomach is nonenlarged. Horizontal portion of the duodenum is fuzzy and indistinct with possible wall thickening. No colon wall thickening. Normal appendix Vascular/Lymphatic: Extensive aortic atherosclerosis. No aneurysmal dilatation. Tortuous distal esophageal and perigastric vessels consistent with varices. No grossly enlarged lymph nodes Reproductive: Uterus and bilateral adnexa are unremarkable. Intrauterine device is present Other: Moderate to large volume of ascites in the abdomen and pelvis. Generalized edema within the left greater than right body wall. No free air. Musculoskeletal: Degenerative changes. No acute or suspicious lesion IMPRESSION: 1. Cirrhotic morphology of the liver with moderate to large volume of ascites in the abdomen and pelvis. Multiple tortuous distal esophageal and perigastric vessels consistent with varices 2. Indistinct appearance of the third portion of duodenum with mild wall thickening, suggestive of duodenitis. Ascites fluid around  the pancreas, suspect that this is related to generalized ascites as opposed to pancreatitis 3. Moderate to large amount of subcutaneous edema and fluid within the left greater than right flank. Electronically Signed   By: Donavan Foil M.D.   On: 10/07/2017 22:01   US Abdomen Limited Ruq  Result Date: 10/07/2017 CLINICAL DATA:  Initial evaluation for acute right upper quadrant pain. EXAM: ULTRASOUND ABDOMEN LIMITED RIGHT UPPER QUADRANT COMPARISON:  Prior CT from earlier the same day. FINDINGS: Gallbladder: Gallbladder contracted. No echogenic stones identified. Wall is thickened up to 7.2 mm, like related incomplete distension and/or hepatic cirrhosis with ascites. Positive sonographic Murphy sign elicited on exam. Common bile duct: Diameter: 4.8 mm Liver: Liver demonstrates  a coarse echogenic echotexture with nodular contour, compatible with cirrhosis. No discrete hepatic masses. Portal vein is patent on color Doppler imaging with hepatofugal blood flow away from the liver. Moderate volume ascites noted within the partially visualized abdomen. IMPRESSION: 1. Hepatic cirrhosis with moderate volume ascites and hepatofugal portal venous flow. 2. Contracted gallbladder without cholelithiasis or biliary dilatation. Gallbladder wall thickening felt to be related to incomplete distension and/or ascites/cirrhosis. Positive sonographic Percell Miller sign was elicited on exam. Electronically Signed   By: Jeannine Boga M.D.   On: 10/07/2017 22:27    EKG: Independently reviewed.  Assessment/Plan Principal Problem:   RUQ abdominal pain Active Problems:   Liver cirrhosis (HCC)   Anasarca   Occult blood positive stool   Thrombocytopenia (HCC)    1. RUQ abd pain - Ascites vs cholecystitis 1. Dr. Benson Norway to see in AM, requested 1. HIDA 2. theraputic paracentesis 2. Continue home lasix and spironolactone 3. 2g sodium diet when not NPO 2. Anasarca - 1. Theraputic paracentesis 3. Cirrhosis - 1. Patient seeing  Dr. Benson Norway who will see patient in hospital as above 4. Occult blood positive stool 1. HGB stable, doesn't appear to be frank GI bleed 2. But worrisome when CT appears to show esophageal varices 3. Repeat CBC in AM 5. Thrombocytopenia - likely secondary to cirrhosis  DVT prophylaxis: SCDs Code Status: Full Family Communication: No family in room Disposition Plan: Home after admit Consults called: Dr. Benson Norway Admission status: Place in [place], [place] [facility] Pager [phone]  If 7AM-7PM, please contact day team taking care of patient [email or web address] Password [facility]  10/08/2017, 2:58 AM

## 2017-10-08 NOTE — Procedures (Signed)
Ultrasound-guided diagnostic and therapeutic paracentesis performed yielding 2  liters of cloudy serous, slightly orange colored fluid. No immediate complications.  Elizabeth Mclean 12:56 PM 10/08/2017

## 2017-10-08 NOTE — Progress Notes (Signed)
The patient was admitted early this AM after midnight and H and P has been reviewed and I am in current agreement with the Assessment and Plan done by Dr. Jennette Kettle. Additional changes to the plan of care have been made accordingly. The patient is a morbidly obese  62 yo AAF with a PMH of HCV, Cirrhosis, HTN, Hx of Left sided Breast Cancer s/p radiation Therapy, GERD, Depression, Chronic Pain, and other comorbids who presented to the [facility] with a cc of Epigastric/RUQ Pain with symptoms lasting over a week. She also had some associated N/V/D and dark colored Vomit and Stools. She was fount to be FOBT Positive. Patient also noticed worsening swelling in Legs, Abdominal Distention, and SOB with Exertion. She was worked up and evaluated and Gastroenterology consulted.   Gastroenterology recommending obtaining a diagnostic and therapeutic paracentesis, and a HIDA Scan for possible cholecystitis. Based on HIDA scan will need to discuss with Surgery if positive. Patient was also FOBT positive with Dark Stools so will continue to monitor Hb/Hct (Repeat showed increased Hb/Hct at 10.4/29.0) and discussed with Gastroenterology about further workup and for now they recommend PPI and will eventually do further invasive procedures once issue of Cholecysitis vs. SBP is addressed. Start Diuresis with 40 mg Lasix po Daily and Spironolactone 100 mg po Daily but may need to increase given patient's volume status. Patient was also started on Rifamixin and Lactulose so will check an Ammonia Level. Patient does have an elevated WBC (? Cholecystitis vs. SBP) but will hold off starting Abx until Paracentesis is done. Will continue to monitor the patient's clinical response to intervention and repeat blood work in AM.

## 2017-10-09 ENCOUNTER — Encounter (HOSPITAL_COMMUNITY)
Admission: EM | Disposition: A | Discharge status: Home / Self Care | Payer: Self-pay | Source: Home / Self Care | Attending: Internal Medicine

## 2017-10-09 DIAGNOSIS — K766 Portal hypertension: Secondary | ICD-10-CM | POA: Diagnosis present

## 2017-10-09 DIAGNOSIS — Z975 Presence of (intrauterine) contraceptive device: Secondary | ICD-10-CM | POA: Diagnosis not present

## 2017-10-09 DIAGNOSIS — M160 Bilateral primary osteoarthritis of hip: Secondary | ICD-10-CM | POA: Diagnosis present

## 2017-10-09 DIAGNOSIS — I8501 Esophageal varices with bleeding: Secondary | ICD-10-CM | POA: Diagnosis present

## 2017-10-09 DIAGNOSIS — R112 Nausea with vomiting, unspecified: Secondary | ICD-10-CM | POA: Diagnosis not present

## 2017-10-09 DIAGNOSIS — Z886 Allergy status to analgesic agent status: Secondary | ICD-10-CM | POA: Diagnosis not present

## 2017-10-09 DIAGNOSIS — Z6841 Body Mass Index (BMI) 40.0 and over, adult: Secondary | ICD-10-CM | POA: Diagnosis not present

## 2017-10-09 DIAGNOSIS — Z885 Allergy status to narcotic agent status: Secondary | ICD-10-CM | POA: Diagnosis not present

## 2017-10-09 DIAGNOSIS — N183 Chronic kidney disease, stage 3 (moderate): Secondary | ICD-10-CM | POA: Diagnosis present

## 2017-10-09 DIAGNOSIS — I129 Hypertensive chronic kidney disease with stage 1 through stage 4 chronic kidney disease, or unspecified chronic kidney disease: Secondary | ICD-10-CM | POA: Diagnosis present

## 2017-10-09 DIAGNOSIS — D6959 Other secondary thrombocytopenia: Secondary | ICD-10-CM | POA: Diagnosis present

## 2017-10-09 DIAGNOSIS — K219 Gastro-esophageal reflux disease without esophagitis: Secondary | ICD-10-CM | POA: Diagnosis present

## 2017-10-09 DIAGNOSIS — K7031 Alcoholic cirrhosis of liver with ascites: Secondary | ICD-10-CM | POA: Diagnosis present

## 2017-10-09 DIAGNOSIS — K31819 Angiodysplasia of stomach and duodenum without bleeding: Secondary | ICD-10-CM | POA: Diagnosis present

## 2017-10-09 DIAGNOSIS — D696 Thrombocytopenia, unspecified: Secondary | ICD-10-CM | POA: Diagnosis not present

## 2017-10-09 DIAGNOSIS — Z87891 Personal history of nicotine dependence: Secondary | ICD-10-CM | POA: Diagnosis not present

## 2017-10-09 DIAGNOSIS — M19012 Primary osteoarthritis, left shoulder: Secondary | ICD-10-CM | POA: Diagnosis present

## 2017-10-09 DIAGNOSIS — R195 Other fecal abnormalities: Secondary | ICD-10-CM | POA: Diagnosis not present

## 2017-10-09 DIAGNOSIS — E875 Hyperkalemia: Secondary | ICD-10-CM | POA: Diagnosis present

## 2017-10-09 DIAGNOSIS — K7460 Unspecified cirrhosis of liver: Secondary | ICD-10-CM | POA: Diagnosis not present

## 2017-10-09 DIAGNOSIS — E8809 Other disorders of plasma-protein metabolism, not elsewhere classified: Secondary | ICD-10-CM | POA: Diagnosis present

## 2017-10-09 DIAGNOSIS — K922 Gastrointestinal hemorrhage, unspecified: Secondary | ICD-10-CM | POA: Diagnosis not present

## 2017-10-09 DIAGNOSIS — Z888 Allergy status to other drugs, medicaments and biological substances status: Secondary | ICD-10-CM | POA: Diagnosis not present

## 2017-10-09 DIAGNOSIS — R601 Generalized edema: Secondary | ICD-10-CM | POA: Diagnosis not present

## 2017-10-09 DIAGNOSIS — Z853 Personal history of malignant neoplasm of breast: Secondary | ICD-10-CM | POA: Diagnosis not present

## 2017-10-09 DIAGNOSIS — R1011 Right upper quadrant pain: Secondary | ICD-10-CM | POA: Diagnosis present

## 2017-10-09 DIAGNOSIS — Z833 Family history of diabetes mellitus: Secondary | ICD-10-CM | POA: Diagnosis not present

## 2017-10-09 DIAGNOSIS — R188 Other ascites: Secondary | ICD-10-CM | POA: Diagnosis not present

## 2017-10-09 DIAGNOSIS — E1122 Type 2 diabetes mellitus with diabetic chronic kidney disease: Secondary | ICD-10-CM | POA: Diagnosis present

## 2017-10-09 DIAGNOSIS — K810 Acute cholecystitis: Secondary | ICD-10-CM | POA: Diagnosis present

## 2017-10-09 DIAGNOSIS — M19011 Primary osteoarthritis, right shoulder: Secondary | ICD-10-CM | POA: Diagnosis present

## 2017-10-09 LAB — CBC WITH DIFFERENTIAL/PLATELET
BASOS ABS: 0 10*3/uL (ref 0.0–0.1)
Basophils Relative: 0 %
EOS PCT: 0 %
Eosinophils Absolute: 0 10*3/uL (ref 0.0–0.7)
HCT: 25 % — ABNORMAL LOW (ref 36.0–46.0)
Hemoglobin: 9.1 g/dL — ABNORMAL LOW (ref 12.0–15.0)
LYMPHS PCT: 7 %
Lymphs Abs: 0.8 10*3/uL (ref 0.7–4.0)
MCH: 33.7 pg (ref 26.0–34.0)
MCHC: 36.4 g/dL — AB (ref 30.0–36.0)
MCV: 92.6 fL (ref 78.0–100.0)
MONO ABS: 1.4 10*3/uL — AB (ref 0.1–1.0)
MONOS PCT: 13 %
Neutro Abs: 8.6 10*3/uL — ABNORMAL HIGH (ref 1.7–7.7)
Neutrophils Relative %: 80 %
PLATELETS: 108 10*3/uL — AB (ref 150–400)
RBC: 2.7 MIL/uL — ABNORMAL LOW (ref 3.87–5.11)
RDW: 17.8 % — AB (ref 11.5–15.5)
WBC: 10.9 10*3/uL — ABNORMAL HIGH (ref 4.0–10.5)

## 2017-10-09 LAB — COMPREHENSIVE METABOLIC PANEL
ALT: 64 U/L — ABNORMAL HIGH (ref 14–54)
ANION GAP: 8 (ref 5–15)
AST: 129 U/L — AB (ref 15–41)
Albumin: 1.7 g/dL — ABNORMAL LOW (ref 3.5–5.0)
Alkaline Phosphatase: 83 U/L (ref 38–126)
BILIRUBIN TOTAL: 4.5 mg/dL — AB (ref 0.3–1.2)
BUN: 42 mg/dL — AB (ref 6–20)
CHLORIDE: 106 mmol/L (ref 101–111)
CO2: 20 mmol/L — ABNORMAL LOW (ref 22–32)
Calcium: 8.1 mg/dL — ABNORMAL LOW (ref 8.9–10.3)
Creatinine, Ser: 2.03 mg/dL — ABNORMAL HIGH (ref 0.44–1.00)
GFR, EST AFRICAN AMERICAN: 29 mL/min — AB (ref >60)
GFR, EST NON AFRICAN AMERICAN: 25 mL/min — AB (ref >60)
Glucose, Bld: 102 mg/dL — ABNORMAL HIGH (ref 65–99)
POTASSIUM: 5.5 mmol/L — AB (ref 3.5–5.1)
Sodium: 134 mmol/L — ABNORMAL LOW (ref 135–145)
TOTAL PROTEIN: 5.9 g/dL — AB (ref 6.5–8.1)

## 2017-10-09 LAB — PH, BODY FLUID: PH, BODY FLUID: 7.9

## 2017-10-09 LAB — MAGNESIUM: MAGNESIUM: 2.1 mg/dL (ref 1.7–2.4)

## 2017-10-09 LAB — HEMOGLOBIN AND HEMATOCRIT, BLOOD
HCT: 24.4 % — ABNORMAL LOW (ref 36.0–46.0)
HEMATOCRIT: 26.2 % — AB (ref 36.0–46.0)
HEMOGLOBIN: 8.8 g/dL — AB (ref 12.0–15.0)
HEMOGLOBIN: 9.7 g/dL — AB (ref 12.0–15.0)

## 2017-10-09 LAB — GLUCOSE, CAPILLARY
GLUCOSE-CAPILLARY: 92 mg/dL (ref 65–99)
Glucose-Capillary: 91 mg/dL (ref 65–99)

## 2017-10-09 LAB — PHOSPHORUS: PHOSPHORUS: 4.4 mg/dL (ref 2.5–4.6)

## 2017-10-09 LAB — PATHOLOGIST SMEAR REVIEW

## 2017-10-09 SURGERY — EGD (ESOPHAGOGASTRODUODENOSCOPY)
Anesthesia: Monitor Anesthesia Care | Laterality: Left

## 2017-10-09 MED ORDER — LACTULOSE 10 GM/15ML PO SOLN
30.0000 g | Freq: Three times a day (TID) | ORAL | Status: DC
  Administered 2017-10-09 – 2017-10-14 (×12): 30 g via ORAL
  Filled 2017-10-09 (×14): qty 45

## 2017-10-09 MED ORDER — PIPERACILLIN-TAZOBACTAM 3.375 G IVPB
3.3750 g | Freq: Three times a day (TID) | INTRAVENOUS | Status: DC
  Administered 2017-10-09 – 2017-10-15 (×17): 3.375 g via INTRAVENOUS
  Filled 2017-10-09 (×19): qty 50

## 2017-10-09 MED ORDER — ALBUMIN HUMAN 25 % IV SOLN
25.0000 g | Freq: Once | INTRAVENOUS | Status: AC
  Administered 2017-10-09: 25 g via INTRAVENOUS
  Filled 2017-10-09: qty 100

## 2017-10-09 NOTE — Progress Notes (Addendum)
Subjective: Elizabeth Mclean is a patient well known to Dr. Dr. Benson Norway from his office. Patient presented to the hospital with complaints of epigastric pain in the process of her workup was found to have Nonvisualization of the gallbladder on HIDA scan with prolonged hepatic retention of the intrinsic tracer consistent with hepatocellular disease. Appreciate surgical evaluation. The surgeons do not feel patient has acute cholecystitis and therefore does not require surgery.  Objective: Vital signs in last 24 hours: Temp:  [98 F (36.7 C)-98.6 F (37 C)] 98 F (36.7 C) (01/23 1316) Pulse Rate:  [82-93] 82 (01/23 1316) Resp:  [16-18] 18 (01/23 1316) BP: (117-147)/(45-60) 131/54 (01/23 1316) SpO2:  [97 %-100 %] 97 % (01/23 1316) Last BM Date: 10/07/17  Intake/Output from previous day: 01/22 0701 - 01/23 0700 In: 0  Out: 2000  Intake/Output this shift: No intake/output data recorded.  General appearance: alert, cooperative and morbidly obese Resp: clear to auscultation bilaterally Cardio: regular rate and rhythm, S1, S2 normal, no murmur, click, rub or gallop GI: soft, morbidly obese with epigastric tenderness on palpation with gaurding but without rebound or rigidity ; bowel sounds normal; no masses,  no organomegaly Extremities: edema 3  Lab Results: Recent Labs    10/08/17 0741 10/08/17 0943  10/08/17 1915 10/09/17 0049 10/09/17 0612  WBC 11.9* 12.3*  --   --   --  10.9*  HGB 9.8* 10.4*   < > 10.2* 8.8* 9.1*  HCT 27.0* 29.0*   < > 28.1* 24.4* 25.0*  PLT PLATELET CLUMPS NOTED ON SMEAR, UNABLE TO ESTIMATE 112*  --   --   --  108*   < > = values in this interval not displayed.   BMET Recent Labs    10/07/17 1512 10/08/17 1317 10/09/17 0612  NA 132* 133* 134*  K 5.0 5.3* 5.5*  CL 104 105 106  CO2 20* 18* 20*  GLUCOSE 133* 86 102*  BUN 37* 41* 42*  CREATININE 1.66* 1.79* 2.03*  CALCIUM 8.0* 8.2* 8.1*   LFT Recent Labs    10/09/17 0612  PROT 5.9*  ALBUMIN  1.7*  AST 129*  ALT 64*  ALKPHOS 83  BILITOT 4.5*   Studies/Results: Dg Chest 2 View  Result Date: 10/07/2017 CLINICAL DATA:  Short of breath EXAM: CHEST  2 VIEW COMPARISON:  12/13/2013 FINDINGS: Low lung volumes. No focal consolidation or pleural effusion. Cardiomediastinal silhouette within normal limits. Aortic atherosclerosis. No pneumothorax. IMPRESSION: No active cardiopulmonary disease.  Low lung volumes. Electronically Signed   By: Donavan Foil M.D.   On: 10/07/2017 23:15   Nm Hepatobiliary Liver Func  Result Date: 10/08/2017 CLINICAL DATA:  RIGHT upper quadrant pain question cholecystitis, history of cirrhosis, hepatitis-C EXAM: NUCLEAR MEDICINE HEPATOBILIARY IMAGING TECHNIQUE: Sequential images of the abdomen were obtained out to 60 minutes following intravenous administration of radiopharmaceutical. Following 1 hour of imaging, patient was injected with morphine and imaging was continued for 30 minutes. RADIOPHARMACEUTICALS:  4.2 mCi Tc-22m  Choletec IV COMPARISON:  None FINDINGS: Normal tracer extraction from bloodstream indicating normal hepatocellular function. Normal excretion of tracer into biliary tree. Gallbladder did not visualize after 1 hour of imaging. Following morphine and an additional 30 minutes of imaging, the gallbladder failed to visualize. Small bowel was visualized by 20 minutes. Somewhat prolonged hepatic retention of tracer consistent with hepatocellular dysfunction. IMPRESSION: Nonvisualization of the gallbladder despite morphine augmentation consistent with acute cholecystitis. Prolonged hepatic retention of tracer consistent with hepatocellular dysfunction; recommend correlation with LFTs. Electronically Signed  By: Lavonia Dana M.D.   On: 10/08/2017 19:15   Ct Abdomen Pelvis W Contrast  Result Date: 10/07/2017 CLINICAL DATA:  Epigastric abdominal pain EXAM: CT ABDOMEN AND PELVIS WITH CONTRAST TECHNIQUE: Multidetector CT imaging of the abdomen and pelvis was  performed using the standard protocol following bolus administration of intravenous contrast. CONTRAST:  75 cc Isovue 300 intravenous COMPARISON:  CT abdomen pelvis 02/21/2016 FINDINGS: Lower chest: Lung bases demonstrate no acute consolidation or effusion. Borderline cardiomegaly. Hepatobiliary: Cirrhotic liver. Contracted gallbladder. No biliary dilatation. Pancreas: No ductal dilatation. Fluid around the pancreas likely related to generalized ascites. Spleen: Normal in size without focal abnormality. Adrenals/Urinary Tract: Adrenal glands are within normal limits. Stable cyst in the upper pole of the left kidney. The bladder is unremarkable Stomach/Bowel: The stomach is nonenlarged. Horizontal portion of the duodenum is fuzzy and indistinct with possible wall thickening. No colon wall thickening. Normal appendix Vascular/Lymphatic: Extensive aortic atherosclerosis. No aneurysmal dilatation. Tortuous distal esophageal and perigastric vessels consistent with varices. No grossly enlarged lymph nodes Reproductive: Uterus and bilateral adnexa are unremarkable. Intrauterine device is present Other: Moderate to large volume of ascites in the abdomen and pelvis. Generalized edema within the left greater than right body wall. No free air. Musculoskeletal: Degenerative changes. No acute or suspicious lesion IMPRESSION: 1. Cirrhotic morphology of the liver with moderate to large volume of ascites in the abdomen and pelvis. Multiple tortuous distal esophageal and perigastric vessels consistent with varices 2. Indistinct appearance of the third portion of duodenum with mild wall thickening, suggestive of duodenitis. Ascites fluid around the pancreas, suspect that this is related to generalized ascites as opposed to pancreatitis 3. Moderate to large amount of subcutaneous edema and fluid within the left greater than right flank. Electronically Signed   By: Donavan Foil M.D.   On: 10/07/2017 22:01   US Abdomen Limited  Ruq  Result Date: 10/07/2017 CLINICAL DATA:  Initial evaluation for acute right upper quadrant pain. EXAM: ULTRASOUND ABDOMEN LIMITED RIGHT UPPER QUADRANT COMPARISON:  Prior CT from earlier the same day. FINDINGS: Gallbladder: Gallbladder contracted. No echogenic stones identified. Wall is thickened up to 7.2 mm, like related incomplete distension and/or hepatic cirrhosis with ascites. Positive sonographic Murphy sign elicited on exam. Common bile duct: Diameter: 4.8 mm Liver: Liver demonstrates a coarse echogenic echotexture with nodular contour, compatible with cirrhosis. No discrete hepatic masses. Portal vein is patent on color Doppler imaging with hepatofugal blood flow away from the liver. Moderate volume ascites noted within the partially visualized abdomen. IMPRESSION: 1. Hepatic cirrhosis with moderate volume ascites and hepatofugal portal venous flow. 2. Contracted gallbladder without cholelithiasis or biliary dilatation. Gallbladder wall thickening felt to be related to incomplete distension and/or ascites/cirrhosis. Positive sonographic Percell Miller sign was elicited on exam. Electronically Signed   By: Jeannine Boga M.D.   On: 10/07/2017 22:27   Ir Paracentesis  Result Date: 10/08/2017 INDICATION: History of hepatitis-C with cirrhosis. Ascites present on recent CT scan. Request is made for diagnostic and therapeutic paracentesis. EXAM: ULTRASOUND GUIDED DIAGNOSTIC AND THERAPEUTIC PARACENTESIS MEDICATIONS: 2% lidocaine COMPLICATIONS: None immediate. PROCEDURE: Informed written consent was obtained from the patient after a discussion of the risks, benefits and alternatives to treatment. A timeout was performed prior to the initiation of the procedure. Initial ultrasound scanning demonstrates a small amount of ascites within the right upper abdominal quadrant. The right upper abdomen was prepped and draped in the usual sterile fashion. 2% lidocaine was used for local anesthesia. Following this, a 19  gauge, 15-cm,  Yueh catheter was introduced. An ultrasound image was saved for documentation purposes. The paracentesis was performed. The catheter was removed and a dressing was applied. The patient tolerated the procedure well without immediate post procedural complication. FINDINGS: A total of approximately 2 L of slightly cloudy serous, orange colored fluid was removed. Samples were sent to the laboratory as requested by the clinical team. IMPRESSION: Successful ultrasound-guided paracentesis yielding 2 liters of peritoneal fluid. Read by: Saverio Danker, PA-C Electronically Signed   By: Marybelle Killings M.D.   On: 10/08/2017 12:57   Medications: I have reviewed the patient's current medications.  Assessment/Plan: 1) Epigastric pain with nonvisualization of the gallbladder and HIDA scan in the setting of non-alcoholic cirrhosis. Plans are to proceed with a EGD tomorrow for the recommendation made thereafter.  2) Alcoholic cirrhosis with abnormal weight gain-we will start the patient 2 g sodium heart healthy diet   LOS: 0 days   Keyleigh Manninen 10/09/2017, 2:20 PM

## 2017-10-09 NOTE — Plan of Care (Signed)
  Progressing Education: Knowledge of General Education information will improve 10/09/2017 1141 - Progressing by Rance Muir, RN Health Behavior/Discharge Planning: Ability to manage health-related needs will improve 10/09/2017 1141 - Progressing by Rance Muir, RN Clinical Measurements: Ability to maintain clinical measurements within normal limits will improve 10/09/2017 1141 - Progressing by Rance Muir, RN Will remain free from infection 10/09/2017 1141 - Progressing by Rance Muir, RN Diagnostic test results will improve 10/09/2017 1141 - Progressing by Rance Muir, RN Respiratory complications will improve 10/09/2017 1141 - Progressing by Rance Muir, RN Cardiovascular complication will be avoided 10/09/2017 1141 - Progressing by Rance Muir, RN Activity: Risk for activity intolerance will decrease 10/09/2017 1141 - Progressing by Rance Muir, RN Nutrition: Adequate nutrition will be maintained 10/09/2017 1141 - Progressing by Rance Muir, RN Coping: Level of anxiety will decrease 10/09/2017 1141 - Progressing by Rance Muir, RN Elimination: Will not experience complications related to bowel motility 10/09/2017 1141 - Progressing by Rance Muir, RN Will not experience complications related to urinary retention 10/09/2017 1141 - Progressing by Rance Muir, RN Pain Managment: General experience of comfort will improve 10/09/2017 1141 - Progressing by Rance Muir, RN Safety: Ability to remain free from injury will improve 10/09/2017 1141 - Progressing by Rance Muir, RN Skin Integrity: Risk for impaired skin integrity will decrease 10/09/2017 1141 - Progressing by Rance Muir, RN

## 2017-10-09 NOTE — Consult Note (Signed)
Reason for Consult: Acute cholecystitis Referring Physician: Dr. Curly Shores GI: Dr. Carol Ada  Elizabeth Mclean is an 62 y.o. female.   HPI: Patient is a 62 year old female with a history of hepatitis C cirrhosis.  She is followed and managed by Dr. Benson Norway.  She was hospitalized 06/2017 with decompensated cirrhosis.  She was noncompliant with her salt intake and resulted in a 40 pound weight gain.  She was diuresed and her symptoms improved.  She dropped her weight down to the 230-40 range.  But since discharge her weight has increased up to 295 and she is not able to be managed as an outpatient was subsequently admitted by medicine.  She has some GERD-like symptoms worse with hot spicy foods.  She uses the used to spice up her food and avoid salt.  Food related symptoms were mostly GERD-like in nature.  She has had a diagnostic and therapeutic paracentesis, 2 L on 10/08/17 by IR.Marland Kitchen  A CT scan of the same showed possible duodenitis.  Right upper quadrant ultrasound shows a thickened gallbladder which he assumed was from a cirrhotic state and ascites.  A HIDA scan was obtained; this shows nonvisualization of the gallbladder despite morphine augmentation consistent with acute cholecystitis.  There was prolonged hepatic retention of tracer consistent with have hepatocellular dysfunction.  She is afebrile vital signs are stable.  Labs today shows a sodium of 134, potassium of 5.5, creatinine rising at 2.03, albumin 1.7, AST 129, ALT 64, total protein 5.9.  Ammonia on admission was 20.  Total bilirubin is 4.5.  Hemoglobin is 9.1, hematocrit is 25, WBC 10.9 platelet count 108,000.  We are asked to see.    Past Medical History:  Diagnosis Date  . Allergy   . Arthritis    hips, shoulders, face  . Bilateral swelling of feet   . Bone spur of foot   . Breast cancer ([place]) 2011  . Cancer [facility]) 2011   Left Breast cancer  . Chronic pain   . Depression   . Diabetes mellitus without complication ([place])   .  GERD (gastroesophageal reflux disease)   . Hepatitis C   . Hypertension   . Insomnia   . Leg cramps   . Personal history of radiation therapy 2011   Left Breast Cancer    Past Surgical History:  Procedure Laterality Date  . BREAST LUMPECTOMY Left 2011  . BREAST SURGERY    . COLONOSCOPY    . epidural steroid injection    . HYSTEROSCOPY W/D&C N/A 05/15/2016   Procedure: DILATATION AND CURETTAGE /HYSTEROSCOPY;  Surgeon: Lavonia Drafts, MD;  Location: [facility];  Service: Gynecology;  Laterality: N/A;  . IR PARACENTESIS  10/08/2017  . MOUTH SURGERY    . THUMB SURGERY      Family History  Problem Relation Age of Onset  . Diabetes Mother   . Diabetes Father   . Hypertension Other     Social History:  reports that she has quit smoking. she has never used smokeless tobacco. She reports that she does not drink alcohol or use drugs.  Allergies:  Allergies  Allergen Reactions  . Aspirin Other (See Comments)    Stomach aches   . Fexofenadine Other (See Comments)    Causes leg and back pain  . Hydrocodone Itching    Medications:  Prior to Admission:  Facility-Administered Medications Prior to Admission  Medication Dose Route Frequency Provider Last Rate Last Dose  . [DISCONTINUED] Levonorgestrel IUD   Intrauterine Once Kennon Rounds,  Standley Dakins, MD       Medications Prior to Admission  Medication Sig Dispense Refill Last Dose  . Alpha-D-Galactosidase (BEANO) TABS Take 1-2 tablets by mouth daily as needed (to prevent gas with certain foods).   Past Month at Unknown time  . Ascorbic Acid (VITAMIN C PO) Take 1 tablet by mouth daily.   10/06/2017 at Unknown time  . aspirin-sod bicarb-citric acid (ALKA-SELTZER) 325 MG TBEF tablet Take 325 mg by mouth every 6 (six) hours as needed (for indigestion).   10/07/2017 at am  . ferrous sulfate 325 (65 FE) MG tablet Take 325 mg by mouth daily with breakfast.   10/06/2017 at Unknown time  . furosemide (LASIX) 40 MG tablet Take 20-40 mg by mouth See  admin instructions. 40 mg by mouth in the morning and 20 mg at bedtime   10/06/2017 at Unknown time  . hydrOXYzine (ATARAX/VISTARIL) 25 MG tablet Take 25 mg by mouth at bedtime as needed for itching  0 10/06/2017 at Unknown time  . lactulose (CHRONULAC) 10 GM/15ML solution Take 30 mLs by mouth 3 (three) times daily. Hold dose for more than 4 loose bowel movements per 24hr.  0 10/06/2017 at pm  . levonorgestrel (MIRENA, 52 MG,) 20 MCG/24HR IUD 1 each by Intrauterine route once.   07/17/2017 at am  . megestrol (MEGACE) 40 MG tablet Take 1 tablet (40 mg total) by mouth 2 (two) times daily. (Patient taking differently: Take 40 mg by mouth daily. ) 40 tablet 0 10/06/2017 at Unknown time  . potassium chloride SA (K-DUR,KLOR-CON) 20 MEQ tablet Take 20 mEq by mouth daily.   10/06/2017 at Unknown time  . RA VITAMIN D-3 2000 units CAPS Take 2,000 Units by mouth daily.   0 10/06/2017 at Unknown time  . rifaximin (XIFAXAN) 550 MG TABS tablet Take 550 mg by mouth 2 (two) times daily.   10/06/2017 at Unknown time  . spironolactone (ALDACTONE) 100 MG tablet Take 1 tablet (100 mg total) by mouth daily. 60 tablet 3 10/06/2017 at Unknown time  . traMADol (ULTRAM) 50 MG tablet Take 50 mg by mouth every 8 hours as needed for pain  0 10/05/2017 at Unknown time  . VESICARE 5 MG tablet Take 5 mg by mouth daily.  0 10/06/2017 at Unknown time   Continuous: . albumin human     Anti-infectives (From admission, onward)   Start     Dose/Rate Route Frequency Ordered Stop   10/08/17 1000  rifaximin (XIFAXAN) tablet 550 mg     550 mg Oral 2 times daily 10/08/17 0253        Results for orders placed or performed during the hospital encounter of 10/07/17 (from the past 48 hour(s))  Lipase, blood     Status: Abnormal   Collection Time: 10/07/17  3:12 PM  Result Value Ref Range   Lipase 55 (H) 11 - 51 U/L  Comprehensive metabolic panel     Status: Abnormal   Collection Time: 10/07/17  3:12 PM  Result Value Ref Range   Sodium 132  (L) 135 - 145 mmol/L   Potassium 5.0 3.5 - 5.1 mmol/L   Chloride 104 101 - 111 mmol/L   CO2 20 (L) 22 - 32 mmol/L   Glucose, Bld 133 (H) 65 - 99 mg/dL   BUN 37 (H) 6 - 20 mg/dL   Creatinine, Ser 1.66 (H) 0.44 - 1.00 mg/dL   Calcium 8.0 (L) 8.9 - 10.3 mg/dL   Total Protein 7.0 6.5 - 8.1  g/dL   Albumin 2.0 (L) 3.5 - 5.0 g/dL   AST 166 (H) 15 - 41 U/L   ALT 79 (H) 14 - 54 U/L   Alkaline Phosphatase 120 38 - 126 U/L   Total Bilirubin 4.6 (H) 0.3 - 1.2 mg/dL   GFR calc non Af Amer 32 (L) >60 mL/min   GFR calc Af Amer 37 (L) >60 mL/min    Comment: (NOTE) The eGFR has been calculated using the CKD EPI equation. This calculation has not been validated in all clinical situations. eGFR's persistently <60 mL/min signify possible Chronic Kidney Disease.    Anion gap 8 5 - 15  CBC     Status: Abnormal   Collection Time: 10/07/17  3:12 PM  Result Value Ref Range   WBC 12.2 (H) 4.0 - 10.5 K/uL   RBC 3.01 (L) 3.87 - 5.11 MIL/uL   Hemoglobin 10.0 (L) 12.0 - 15.0 g/dL   HCT 28.0 (L) 36.0 - 46.0 %   MCV 93.0 78.0 - 100.0 fL   MCH 33.2 26.0 - 34.0 pg   MCHC 35.7 30.0 - 36.0 g/dL   RDW 17.3 (H) 11.5 - 15.5 %   Platelets 74 (L) 150 - 400 K/uL    Comment: REPEATED TO VERIFY PLATELET COUNT CONFIRMED BY SMEAR   Brain natriuretic peptide     Status: None   Collection Time: 10/07/17  8:56 PM  Result Value Ref Range   B Natriuretic Peptide 26.9 0.0 - 100.0 pg/mL  I-stat troponin, ED     Status: None   Collection Time: 10/07/17  9:06 PM  Result Value Ref Range   Troponin i, poc 0.01 0.00 - 0.08 ng/mL   Comment 3            Comment: Due to the release kinetics of cTnI, a negative result within the first hours of the onset of symptoms does not rule out myocardial infarction with certainty. If myocardial infarction is still suspected, repeat the test at appropriate intervals.   POC occult blood, ED Provider will collect     Status: Abnormal   Collection Time: 10/07/17 10:38 PM  Result Value  Ref Range   Fecal Occult Bld POSITIVE (A) NEGATIVE  Urinalysis, Routine w reflex microscopic     Status: Abnormal   Collection Time: 10/08/17  7:00 AM  Result Value Ref Range   Color, Urine AMBER (A) YELLOW    Comment: BIOCHEMICALS MAY BE AFFECTED BY COLOR   APPearance HAZY (A) CLEAR   Specific Gravity, Urine 1.034 (H) 1.005 - 1.030   pH 5.0 5.0 - 8.0   Glucose, UA NEGATIVE NEGATIVE mg/dL   Hgb urine dipstick NEGATIVE NEGATIVE   Bilirubin Urine NEGATIVE NEGATIVE   Ketones, ur NEGATIVE NEGATIVE mg/dL   Protein, ur NEGATIVE NEGATIVE mg/dL   Nitrite NEGATIVE NEGATIVE   Leukocytes, UA NEGATIVE NEGATIVE  CBC     Status: Abnormal   Collection Time: 10/08/17  7:41 AM  Result Value Ref Range   WBC 11.9 (H) 4.0 - 10.5 K/uL   RBC 2.93 (L) 3.87 - 5.11 MIL/uL   Hemoglobin 9.8 (L) 12.0 - 15.0 g/dL   HCT 27.0 (L) 36.0 - 46.0 %   MCV 92.2 78.0 - 100.0 fL   MCH 33.4 26.0 - 34.0 pg   MCHC 36.3 (H) 30.0 - 36.0 g/dL   RDW 17.7 (H) 11.5 - 15.5 %   Platelets PLATELET CLUMPS NOTED ON SMEAR, UNABLE TO ESTIMATE 150 - 400 K/uL  CBC with Differential/Platelet  Status: Abnormal   Collection Time: 10/08/17  9:43 AM  Result Value Ref Range   WBC 12.3 (H) 4.0 - 10.5 K/uL   RBC 3.09 (L) 3.87 - 5.11 MIL/uL   Hemoglobin 10.4 (L) 12.0 - 15.0 g/dL   HCT 29.0 (L) 36.0 - 46.0 %   MCV 93.9 78.0 - 100.0 fL   MCH 33.7 26.0 - 34.0 pg   MCHC 35.9 30.0 - 36.0 g/dL   RDW 17.9 (H) 11.5 - 15.5 %   Platelets 112 (L) 150 - 400 K/uL    Comment: PLATELET COUNT CONFIRMED BY SMEAR   Neutrophils Relative % 82 %   Neutro Abs 10.1 (H) 1.7 - 7.7 K/uL   Lymphocytes Relative 7 %   Lymphs Abs 0.8 0.7 - 4.0 K/uL   Monocytes Relative 11 %   Monocytes Absolute 1.4 (H) 0.1 - 1.0 K/uL   Eosinophils Relative 0 %   Eosinophils Absolute 0.0 0.0 - 0.7 K/uL   Basophils Relative 0 %   Basophils Absolute 0.0 0.0 - 0.1 K/uL  Magnesium     Status: None   Collection Time: 10/08/17  9:43 AM  Result Value Ref Range   Magnesium 2.0  1.7 - 2.4 mg/dL  Phosphorus     Status: None   Collection Time: 10/08/17  9:43 AM  Result Value Ref Range   Phosphorus 3.9 2.5 - 4.6 mg/dL  Comprehensive metabolic panel     Status: Abnormal   Collection Time: 10/08/17  1:17 PM  Result Value Ref Range   Sodium 133 (L) 135 - 145 mmol/L   Potassium 5.3 (H) 3.5 - 5.1 mmol/L   Chloride 105 101 - 111 mmol/L   CO2 18 (L) 22 - 32 mmol/L   Glucose, Bld 86 65 - 99 mg/dL   BUN 41 (H) 6 - 20 mg/dL   Creatinine, Ser 1.79 (H) 0.44 - 1.00 mg/dL   Calcium 8.2 (L) 8.9 - 10.3 mg/dL   Total Protein 6.7 6.5 - 8.1 g/dL   Albumin 1.9 (L) 3.5 - 5.0 g/dL   AST 151 (H) 15 - 41 U/L   ALT 75 (H) 14 - 54 U/L   Alkaline Phosphatase 104 38 - 126 U/L   Total Bilirubin 4.4 (H) 0.3 - 1.2 mg/dL   GFR calc non Af Amer 29 (L) >60 mL/min   GFR calc Af Amer 34 (L) >60 mL/min    Comment: (NOTE) The eGFR has been calculated using the CKD EPI equation. This calculation has not been validated in all clinical situations. eGFR's persistently <60 mL/min signify possible Chronic Kidney Disease.    Anion gap 10 5 - 15  Hemoglobin and hematocrit, blood     Status: Abnormal   Collection Time: 10/08/17  1:17 PM  Result Value Ref Range   Hemoglobin 9.9 (L) 12.0 - 15.0 g/dL   HCT 27.1 (L) 36.0 - 46.0 %  Ammonia     Status: None   Collection Time: 10/08/17  1:17 PM  Result Value Ref Range   Ammonia 20 9 - 35 umol/L  Lactate dehydrogenase (pleural or peritoneal fluid)     Status: Abnormal   Collection Time: 10/08/17  1:20 PM  Result Value Ref Range   LD, Fluid 93 (H) 3 - 23 U/L    Comment: (NOTE) Results should be evaluated in conjunction with serum values    Fluid Type-FLDH Peritoneal   Body fluid cell count with differential     Status: Abnormal   Collection Time: 10/08/17  1:20 PM  Result Value Ref Range   Fluid Type-FCT Peritoneal    Color, Fluid AMBER (A) YELLOW   Appearance, Fluid CLOUDY (A) CLEAR   WBC, Fluid 96 0 - 1,000 cu mm   Neutrophil Count, Fluid 10  0 - 25 %   Lymphs, Fluid 63 %   Monocyte-Macrophage-Serous Fluid 26 (L) 50 - 90 %   Eos, Fluid 1 %  Albumin, pleural or peritoneal fluid     Status: None   Collection Time: 10/08/17  1:20 PM  Result Value Ref Range   Albumin, Fluid <1.0 g/dL   Fluid Type-FALB Peritoneal   Protein, pleural or peritoneal fluid     Status: None   Collection Time: 10/08/17  1:20 PM  Result Value Ref Range   Total protein, fluid <3.0 g/dL   Fluid Type-FTP Peritoneal   Glucose, pleural or peritoneal fluid     Status: None   Collection Time: 10/08/17  1:20 PM  Result Value Ref Range   Glucose, Fluid 105 mg/dL    Comment: (NOTE) No normal range established for this test Results should be evaluated in conjunction with serum values    Fluid Type-FGLU Peritoneal   Culture, body fluid-bottle     Status: None (Preliminary result)   Collection Time: 10/08/17  1:20 PM  Result Value Ref Range   Specimen Description PERITONEAL    Special Requests NONE    Culture NO GROWTH < 24 HOURS    Report Status PENDING   Gram stain     Status: None   Collection Time: 10/08/17  1:20 PM  Result Value Ref Range   Specimen Description PERITONEAL    Special Requests NONE    Gram Stain      FEW WBC PRESENT, PREDOMINANTLY MONONUCLEAR NO ORGANISMS SEEN    Report Status 10/08/2017 FINAL   Hemoglobin and hematocrit, blood     Status: Abnormal   Collection Time: 10/08/17  7:15 PM  Result Value Ref Range   Hemoglobin 10.2 (L) 12.0 - 15.0 g/dL   HCT 28.1 (L) 36.0 - 46.0 %  Hemoglobin and hematocrit, blood     Status: Abnormal   Collection Time: 10/09/17 12:49 AM  Result Value Ref Range   Hemoglobin 8.8 (L) 12.0 - 15.0 g/dL   HCT 24.4 (L) 36.0 - 46.0 %  CBC with Differential/Platelet     Status: Abnormal   Collection Time: 10/09/17  6:12 AM  Result Value Ref Range   WBC 10.9 (H) 4.0 - 10.5 K/uL   RBC 2.70 (L) 3.87 - 5.11 MIL/uL   Hemoglobin 9.1 (L) 12.0 - 15.0 g/dL   HCT 25.0 (L) 36.0 - 46.0 %   MCV 92.6 78.0 - 100.0  fL   MCH 33.7 26.0 - 34.0 pg   MCHC 36.4 (H) 30.0 - 36.0 g/dL   RDW 17.8 (H) 11.5 - 15.5 %   Platelets 108 (L) 150 - 400 K/uL    Comment: REPEATED TO VERIFY CONSISTENT WITH PREVIOUS RESULT    Neutrophils Relative % 80 %   Neutro Abs 8.6 (H) 1.7 - 7.7 K/uL   Lymphocytes Relative 7 %   Lymphs Abs 0.8 0.7 - 4.0 K/uL   Monocytes Relative 13 %   Monocytes Absolute 1.4 (H) 0.1 - 1.0 K/uL   Eosinophils Relative 0 %   Eosinophils Absolute 0.0 0.0 - 0.7 K/uL   Basophils Relative 0 %   Basophils Absolute 0.0 0.0 - 0.1 K/uL  Comprehensive metabolic panel     Status:  Abnormal   Collection Time: 10/09/17  6:12 AM  Result Value Ref Range   Sodium 134 (L) 135 - 145 mmol/L   Potassium 5.5 (H) 3.5 - 5.1 mmol/L   Chloride 106 101 - 111 mmol/L   CO2 20 (L) 22 - 32 mmol/L   Glucose, Bld 102 (H) 65 - 99 mg/dL   BUN 42 (H) 6 - 20 mg/dL   Creatinine, Ser 2.03 (H) 0.44 - 1.00 mg/dL   Calcium 8.1 (L) 8.9 - 10.3 mg/dL   Total Protein 5.9 (L) 6.5 - 8.1 g/dL   Albumin 1.7 (L) 3.5 - 5.0 g/dL   AST 129 (H) 15 - 41 U/L   ALT 64 (H) 14 - 54 U/L   Alkaline Phosphatase 83 38 - 126 U/L   Total Bilirubin 4.5 (H) 0.3 - 1.2 mg/dL   GFR calc non Af Amer 25 (L) >60 mL/min   GFR calc Af Amer 29 (L) >60 mL/min    Comment: (NOTE) The eGFR has been calculated using the CKD EPI equation. This calculation has not been validated in all clinical situations. eGFR's persistently <60 mL/min signify possible Chronic Kidney Disease.    Anion gap 8 5 - 15  Magnesium     Status: None   Collection Time: 10/09/17  6:12 AM  Result Value Ref Range   Magnesium 2.1 1.7 - 2.4 mg/dL  Phosphorus     Status: None   Collection Time: 10/09/17  6:12 AM  Result Value Ref Range   Phosphorus 4.4 2.5 - 4.6 mg/dL    Dg Chest 2 View  Result Date: 10/07/2017 CLINICAL DATA:  Short of breath EXAM: CHEST  2 VIEW COMPARISON:  12/13/2013 FINDINGS: Low lung volumes. No focal consolidation or pleural effusion. Cardiomediastinal silhouette  within normal limits. Aortic atherosclerosis. No pneumothorax. IMPRESSION: No active cardiopulmonary disease.  Low lung volumes. Electronically Signed   By: Donavan Foil M.D.   On: 10/07/2017 23:15   Nm Hepatobiliary Liver Func  Result Date: 10/08/2017 CLINICAL DATA:  RIGHT upper quadrant pain question cholecystitis, history of cirrhosis, hepatitis-C EXAM: NUCLEAR MEDICINE HEPATOBILIARY IMAGING TECHNIQUE: Sequential images of the abdomen were obtained out to 60 minutes following intravenous administration of radiopharmaceutical. Following 1 hour of imaging, patient was injected with morphine and imaging was continued for 30 minutes. RADIOPHARMACEUTICALS:  4.2 mCi Tc-62m Choletec IV COMPARISON:  None FINDINGS: Normal tracer extraction from bloodstream indicating normal hepatocellular function. Normal excretion of tracer into biliary tree. Gallbladder did not visualize after 1 hour of imaging. Following morphine and an additional 30 minutes of imaging, the gallbladder failed to visualize. Small bowel was visualized by 20 minutes. Somewhat prolonged hepatic retention of tracer consistent with hepatocellular dysfunction. IMPRESSION: Nonvisualization of the gallbladder despite morphine augmentation consistent with acute cholecystitis. Prolonged hepatic retention of tracer consistent with hepatocellular dysfunction; recommend correlation with LFTs. Electronically Signed   By: MLavonia DanaM.D.   On: 10/08/2017 19:15   Ct Abdomen Pelvis W Contrast  Result Date: 10/07/2017 CLINICAL DATA:  Epigastric abdominal pain EXAM: CT ABDOMEN AND PELVIS WITH CONTRAST TECHNIQUE: Multidetector CT imaging of the abdomen and pelvis was performed using the standard protocol following bolus administration of intravenous contrast. CONTRAST:  75 cc Isovue 300 intravenous COMPARISON:  CT abdomen pelvis 02/21/2016 FINDINGS: Lower chest: Lung bases demonstrate no acute consolidation or effusion. Borderline cardiomegaly. Hepatobiliary:  Cirrhotic liver. Contracted gallbladder. No biliary dilatation. Pancreas: No ductal dilatation. Fluid around the pancreas likely related to generalized ascites. Spleen: Normal in size without  focal abnormality. Adrenals/Urinary Tract: Adrenal glands are within normal limits. Stable cyst in the upper pole of the left kidney. The bladder is unremarkable Stomach/Bowel: The stomach is nonenlarged. Horizontal portion of the duodenum is fuzzy and indistinct with possible wall thickening. No colon wall thickening. Normal appendix Vascular/Lymphatic: Extensive aortic atherosclerosis. No aneurysmal dilatation. Tortuous distal esophageal and perigastric vessels consistent with varices. No grossly enlarged lymph nodes Reproductive: Uterus and bilateral adnexa are unremarkable. Intrauterine device is present Other: Moderate to large volume of ascites in the abdomen and pelvis. Generalized edema within the left greater than right body wall. No free air. Musculoskeletal: Degenerative changes. No acute or suspicious lesion IMPRESSION: 1. Cirrhotic morphology of the liver with moderate to large volume of ascites in the abdomen and pelvis. Multiple tortuous distal esophageal and perigastric vessels consistent with varices 2. Indistinct appearance of the third portion of duodenum with mild wall thickening, suggestive of duodenitis. Ascites fluid around the pancreas, suspect that this is related to generalized ascites as opposed to pancreatitis 3. Moderate to large amount of subcutaneous edema and fluid within the left greater than right flank. Electronically Signed   By: Donavan Foil M.D.   On: 10/07/2017 22:01   US Abdomen Limited Ruq  Result Date: 10/07/2017 CLINICAL DATA:  Initial evaluation for acute right upper quadrant pain. EXAM: ULTRASOUND ABDOMEN LIMITED RIGHT UPPER QUADRANT COMPARISON:  Prior CT from earlier the same day. FINDINGS: Gallbladder: Gallbladder contracted. No echogenic stones identified. Wall is thickened  up to 7.2 mm, like related incomplete distension and/or hepatic cirrhosis with ascites. Positive sonographic Murphy sign elicited on exam. Common bile duct: Diameter: 4.8 mm Liver: Liver demonstrates a coarse echogenic echotexture with nodular contour, compatible with cirrhosis. No discrete hepatic masses. Portal vein is patent on color Doppler imaging with hepatofugal blood flow away from the liver. Moderate volume ascites noted within the partially visualized abdomen. IMPRESSION: 1. Hepatic cirrhosis with moderate volume ascites and hepatofugal portal venous flow. 2. Contracted gallbladder without cholelithiasis or biliary dilatation. Gallbladder wall thickening felt to be related to incomplete distension and/or ascites/cirrhosis. Positive sonographic Percell Miller sign was elicited on exam. Electronically Signed   By: Jeannine Boga M.D.   On: 10/07/2017 22:27   Ir Paracentesis  Result Date: 10/08/2017 INDICATION: History of hepatitis-C with cirrhosis. Ascites present on recent CT scan. Request is made for diagnostic and therapeutic paracentesis. EXAM: ULTRASOUND GUIDED DIAGNOSTIC AND THERAPEUTIC PARACENTESIS MEDICATIONS: 2% lidocaine COMPLICATIONS: None immediate. PROCEDURE: Informed written consent was obtained from the patient after a discussion of the risks, benefits and alternatives to treatment. A timeout was performed prior to the initiation of the procedure. Initial ultrasound scanning demonstrates a small amount of ascites within the right upper abdominal quadrant. The right upper abdomen was prepped and draped in the usual sterile fashion. 2% lidocaine was used for local anesthesia. Following this, a 19 gauge, 15-cm, Yueh catheter was introduced. An ultrasound image was saved for documentation purposes. The paracentesis was performed. The catheter was removed and a dressing was applied. The patient tolerated the procedure well without immediate post procedural complication. FINDINGS: A total of  approximately 2 L of slightly cloudy serous, orange colored fluid was removed. Samples were sent to the laboratory as requested by the clinical team. IMPRESSION: Successful ultrasound-guided paracentesis yielding 2 liters of peritoneal fluid. Read by: Saverio Danker, PA-C Electronically Signed   By: Marybelle Killings M.D.   On: 10/08/2017 12:57    Review of Systems  Constitutional: Negative for chills, diaphoresis, fever, malaise/fatigue and  weight loss.       Weight gain  HENT: Negative.   Eyes: Negative.   Respiratory: Positive for shortness of breath. Negative for cough, hemoptysis, sputum production and wheezing.   Cardiovascular: Positive for leg swelling. Negative for chest pain, palpitations, orthopnea, claudication and PND.  Gastrointestinal: Positive for abdominal pain (some abdominal pain, more central when we examined her.), heartburn and nausea. Negative for blood in stool, constipation, diarrhea, melena and vomiting.  Genitourinary: Negative.   Musculoskeletal: Negative.   Skin: Negative.   Neurological: Positive for weakness. Negative for dizziness, tingling, tremors, sensory change, speech change, focal weakness, seizures, loss of consciousness and headaches.  Endo/Heme/Allergies: Negative for environmental allergies and polydipsia. Does not bruise/bleed easily.  Psychiatric/Behavioral: Positive for depression.   Blood pressure (!) 117/50, pulse 84, temperature 98.6 F (37 C), temperature source Oral, resp. rate 16, height 5' 7"  (1.702 m), weight 135.1 kg (297 lb 13.5 oz), last menstrual period 06/04/2017, SpO2 98 %. Physical Exam  Constitutional: She is oriented to person, place, and time. She appears well-developed and well-nourished. No distress.  HENT:  Head: Normocephalic and atraumatic.  Mouth/Throat: No oropharyngeal exudate.  Eyes: Right eye exhibits no discharge. Left eye exhibits no discharge. No scleral icterus.  Pupils are equal  Neck: Normal range of motion. No JVD  present. No tracheal deviation present. No thyromegaly present.  GI: She exhibits distension. She exhibits no mass. There is tenderness. There is no rebound and no guarding.  Marked distention with recent paracentesis of 2 L On initial exam she was not tender to deep palpation right upper quadrant.  This was done by Dr. Rosendo Gros.  Follow-up exam showed some generalized tenderness and perhaps a little tenderness in the right upper quadrant.  Musculoskeletal: She exhibits edema (She has +2-+3 edema both lower extremities from the knee down.). She exhibits no tenderness.  Lymphadenopathy:    She has no cervical adenopathy.  Neurological: She is alert and oriented to person, place, and time. No cranial nerve deficit.  Skin: Skin is warm and dry. No rash noted. She is not diaphoretic. No erythema. No pallor.  Psychiatric: She has a normal mood and affect. Her behavior is normal. Judgment and thought content normal.    Assessment/Plan: Hepatitis C cirrhosis. HIDA positive -possible acute cholecystitis Fluid overload secondary to cirrhosis Hypertension Dyspnea on exertion/bilateral lower extremity edema Anemia -stable Thrombocytopenia Acute on chronic kidney disease  Plan: On exam she is not acutely tender, and exam is not indicative of acute cholecystitis.  Because of her Cirrhosis she is an extremely poor surgical candidate.  There are currently no coagulation studies on the chart of her last INR was 2.02.  Dr. Rosendo Gros has reviewed the CT scan and it was his recommendation that we treat her initially with antibiotics.  Would recommend Zosyn.  If her symptoms do not improve second step would be an IR drain.  We would avoid surgical intervention as long as possible and only if she became acutely ill.  We would start her diet with clear liquids and see how she progresses.  We will follow with you. Sabre Leonetti 10/09/2017, 1:03 PM

## 2017-10-09 NOTE — Progress Notes (Addendum)
PROGRESS NOTE    Elizabeth Mclean  OIZ:[id number] DOB: 1956/03/20 DOA: 10/07/2017 PCP: Kerin Perna, NP   Chief Complaint  Patient presents with  . Abdominal Pain    Brief Narrative:  HPI on 10/08/2017 by Dr. Crist Fat is a 62 y.o. female with medical history significant of HCV, cirrhosis, HTN.  Patient presents to the ED with c/o epigastric / RUQ pain.  Symptoms onset around 1 week ago.  Associated vomiting and diarrhea.  Worse with eating.  9/10, non-radiating.  Dark colored vomit and stools.  Also reports peripheral edema, abdominal distention, and some SOB with exertion.  Interim history Patient admitted for abdominal pain. Found to have positive FOBT. Also had HIDA scan showing acute cholecystitis. Gastroenterology consultation appreciated. Patient also had paracentesis with 2 L removed.  Assessment & Plan   Right upper quadrant/epigastric abdominal pain -Gastroenterology consultation appreciated -Right upper quadrant ultrasound showing thickened gallbladder, positive Murphy sign -CT abdomen and pelvis showed possible duodenitis -HIDA scan: Nonvisualization of the gallbladder despite morphine augmentation consistent with acute cholecystitis -Discussed with Dr. Benson Norway. Recommended surgical consultation. Possibility of EGD.  Decompensated Cirrhosis with anasarca -CT abdomen and pelvis showed cirrhotic morphology of the liver with moderate to large volume ascites in the abdomen and pelvis. -Interventional radiology consultation appreciated, status post paracentesis showing 2 L -Continue lasix (hold spironolactone due to hyperkalemia) -Continue lactulose and rifaximin -Monitor daily weights, intake and output -Fluid gram stain showed no organisms seen; Fluid culture shows no growth to date  Occult blood positive stool -hemoglobin currently 9.1, appears to be stable. Baseline hemoglobin 9-10 -GI consulted, possibility of EGD -Continue to monitor  CBC  Thrombocytopenia -likely secondary to cirrhosis  -Platelets currently 108, baseline prostate 70-80 -Continue monitor CBC  Acute kidney injury on chronic kidney disease, stage III -Creatinine currently 2.03 baseline approximately 1.4-1.6 -Will give albumin -Monitor BMP  Hyperkalemia -Likely secondary to spironolactone, will continue to monitor closely -will increase lactulose  Hypoalbuminemia -albumin 1.7 -will give dose of albumin and monitor -will consult nutrition  DVT Prophylaxis  SCDs  Code Status: Full  Family Communication: Daughter at bedside  Disposition Plan: Observation, pending surgical consult and further GI recommendations  Consultants Gastroenterology General surgery  Procedures  HIDA US guided therapeutic paracentesis   Antibiotics   Anti-infectives (From admission, onward)   Start     Dose/Rate Route Frequency Ordered Stop   10/08/17 1000  rifaximin (XIFAXAN) tablet 550 mg     550 mg Oral 2 times daily 10/08/17 0253        Subjective:   Elizabeth Mclean seen and examined today.  Patient continues to have some abdominal pain, states it is better. Has bowel movements. Denies dizziness or headache.  Objective:   Vitals:   10/08/17 1400 10/08/17 1555 10/08/17 2011 10/09/17 0559  BP:  (!) 136/45 (!) 147/60 (!) 117/50  Pulse:  93 91 84  Resp:  16 16 16   Temp:  98.5 F (36.9 C) 98.6 F (37 C) 98.6 F (37 C)  TempSrc:  Oral Oral Oral  SpO2:  100% 100% 98%  Weight: 135.1 kg (297 lb 13.5 oz)     Height:        Intake/Output Summary (Last 24 hours) at 10/09/2017 1310 Last data filed at 10/09/2017 0900 Gross per 24 hour  Intake 0 ml  Output -  Net 0 ml   Filed Weights   10/07/17 1459 10/08/17 1400  Weight: 117.9 kg (260 lb) 135.1 kg (297 lb  13.5 oz)    Exam  General: Well developed, well nourished, NAD, appears stated age  75: NCAT,mucous membranes moist.   Cardiovascular: S1 S2 auscultated, no rubs, murmurs or gallops.  Regular rate and rhythm.  Respiratory: Clear to auscultation bilaterally with equal chest rise  Abdomen: Soft, obese, tender, nondistended, + bowel sounds  Extremities: warm dry without cyanosis clubbing. 2+ lower extremity edema  Neuro: AAOx3, nonfocal  Psych: Normal affect and demeanor with intact judgement and insight   Data Reviewed: I have personally reviewed following labs and imaging studies  CBC: Recent Labs  Lab 10/07/17 1512 10/08/17 0741 10/08/17 0943 10/08/17 1317 10/08/17 1915 10/09/17 0049 10/09/17 0612  WBC 12.2* 11.9* 12.3*  --   --   --  10.9*  NEUTROABS  --   --  10.1*  --   --   --  8.6*  HGB 10.0* 9.8* 10.4* 9.9* 10.2* 8.8* 9.1*  HCT 28.0* 27.0* 29.0* 27.1* 28.1* 24.4* 25.0*  MCV 93.0 92.2 93.9  --   --   --  92.6  PLT 74* PLATELET CLUMPS NOTED ON SMEAR, UNABLE TO ESTIMATE 112*  --   --   --  481*   Basic Metabolic Panel: Recent Labs  Lab 10/07/17 1512 10/08/17 0943 10/08/17 1317 10/09/17 0612  NA 132*  --  133* 134*  K 5.0  --  5.3* 5.5*  CL 104  --  105 106  CO2 20*  --  18* 20*  GLUCOSE 133*  --  86 102*  BUN 37*  --  41* 42*  CREATININE 1.66*  --  1.79* 2.03*  CALCIUM 8.0*  --  8.2* 8.1*  MG  --  2.0  --  2.1  PHOS  --  3.9  --  4.4   GFR: Estimated Creatinine Clearance: 41.8 mL/min (A) (by C-G formula based on SCr of 2.03 mg/dL (H)). Liver Function Tests: Recent Labs  Lab 10/07/17 1512 10/08/17 1317 10/09/17 0612  AST 166* 151* 129*  ALT 79* 75* 64*  ALKPHOS 120 104 83  BILITOT 4.6* 4.4* 4.5*  PROT 7.0 6.7 5.9*  ALBUMIN 2.0* 1.9* 1.7*   Recent Labs  Lab 10/07/17 1512  LIPASE 55*   Recent Labs  Lab 10/08/17 1317  AMMONIA 20   Coagulation Profile: No results for input(s): INR, PROTIME in the last 168 hours. Cardiac Enzymes: No results for input(s): CKTOTAL, CKMB, CKMBINDEX, TROPONINI in the last 168 hours. BNP (last 3 results) No results for input(s): PROBNP in the last 8760 hours. HbA1C: No results for input(s):  HGBA1C in the last 72 hours. CBG: No results for input(s): GLUCAP in the last 168 hours. Lipid Profile: No results for input(s): CHOL, HDL, LDLCALC, TRIG, CHOLHDL, LDLDIRECT in the last 72 hours. Thyroid Function Tests: No results for input(s): TSH, T4TOTAL, FREET4, T3FREE, THYROIDAB in the last 72 hours. Anemia Panel: No results for input(s): VITAMINB12, FOLATE, FERRITIN, TIBC, IRON, RETICCTPCT in the last 72 hours. Urine analysis:    Component Value Date/Time   COLORURINE AMBER (A) 10/08/2017 0700   APPEARANCEUR HAZY (A) 10/08/2017 0700   LABSPEC 1.034 (H) 10/08/2017 0700   PHURINE 5.0 10/08/2017 0700   GLUCOSEU NEGATIVE 10/08/2017 0700   HGBUR NEGATIVE 10/08/2017 0700   BILIRUBINUR NEGATIVE 10/08/2017 0700   KETONESUR NEGATIVE 10/08/2017 0700   PROTEINUR NEGATIVE 10/08/2017 0700   UROBILINOGEN 1.0 12/13/2013 1432   NITRITE NEGATIVE 10/08/2017 0700   LEUKOCYTESUR NEGATIVE 10/08/2017 0700   Sepsis Labs: @LABRCNTIP (procalcitonin:4,lacticidven:4)  ) Recent Results (from  the past 240 hour(s))  Culture, body fluid-bottle     Status: None (Preliminary result)   Collection Time: 10/08/17  1:20 PM  Result Value Ref Range Status   Specimen Description PERITONEAL  Final   Special Requests NONE  Final   Culture NO GROWTH < 24 HOURS  Final   Report Status PENDING  Incomplete  Gram stain     Status: None   Collection Time: 10/08/17  1:20 PM  Result Value Ref Range Status   Specimen Description PERITONEAL  Final   Special Requests NONE  Final   Gram Stain   Final    FEW WBC PRESENT, PREDOMINANTLY MONONUCLEAR NO ORGANISMS SEEN    Report Status 10/08/2017 FINAL  Final      Radiology Studies: Dg Chest 2 View  Result Date: 10/07/2017 CLINICAL DATA:  Short of breath EXAM: CHEST  2 VIEW COMPARISON:  12/13/2013 FINDINGS: Low lung volumes. No focal consolidation or pleural effusion. Cardiomediastinal silhouette within normal limits. Aortic atherosclerosis. No pneumothorax.  IMPRESSION: No active cardiopulmonary disease.  Low lung volumes. Electronically Signed   By: Donavan Foil M.D.   On: 10/07/2017 23:15   Nm Hepatobiliary Liver Func  Result Date: 10/08/2017 CLINICAL DATA:  RIGHT upper quadrant pain question cholecystitis, history of cirrhosis, hepatitis-C EXAM: NUCLEAR MEDICINE HEPATOBILIARY IMAGING TECHNIQUE: Sequential images of the abdomen were obtained out to 60 minutes following intravenous administration of radiopharmaceutical. Following 1 hour of imaging, patient was injected with morphine and imaging was continued for 30 minutes. RADIOPHARMACEUTICALS:  4.2 mCi Tc-97m  Choletec IV COMPARISON:  None FINDINGS: Normal tracer extraction from bloodstream indicating normal hepatocellular function. Normal excretion of tracer into biliary tree. Gallbladder did not visualize after 1 hour of imaging. Following morphine and an additional 30 minutes of imaging, the gallbladder failed to visualize. Small bowel was visualized by 20 minutes. Somewhat prolonged hepatic retention of tracer consistent with hepatocellular dysfunction. IMPRESSION: Nonvisualization of the gallbladder despite morphine augmentation consistent with acute cholecystitis. Prolonged hepatic retention of tracer consistent with hepatocellular dysfunction; recommend correlation with LFTs. Electronically Signed   By: Lavonia Dana M.D.   On: 10/08/2017 19:15   Ct Abdomen Pelvis W Contrast  Result Date: 10/07/2017 CLINICAL DATA:  Epigastric abdominal pain EXAM: CT ABDOMEN AND PELVIS WITH CONTRAST TECHNIQUE: Multidetector CT imaging of the abdomen and pelvis was performed using the standard protocol following bolus administration of intravenous contrast. CONTRAST:  75 cc Isovue 300 intravenous COMPARISON:  CT abdomen pelvis 02/21/2016 FINDINGS: Lower chest: Lung bases demonstrate no acute consolidation or effusion. Borderline cardiomegaly. Hepatobiliary: Cirrhotic liver. Contracted gallbladder. No biliary dilatation.  Pancreas: No ductal dilatation. Fluid around the pancreas likely related to generalized ascites. Spleen: Normal in size without focal abnormality. Adrenals/Urinary Tract: Adrenal glands are within normal limits. Stable cyst in the upper pole of the left kidney. The bladder is unremarkable Stomach/Bowel: The stomach is nonenlarged. Horizontal portion of the duodenum is fuzzy and indistinct with possible wall thickening. No colon wall thickening. Normal appendix Vascular/Lymphatic: Extensive aortic atherosclerosis. No aneurysmal dilatation. Tortuous distal esophageal and perigastric vessels consistent with varices. No grossly enlarged lymph nodes Reproductive: Uterus and bilateral adnexa are unremarkable. Intrauterine device is present Other: Moderate to large volume of ascites in the abdomen and pelvis. Generalized edema within the left greater than right body wall. No free air. Musculoskeletal: Degenerative changes. No acute or suspicious lesion IMPRESSION: 1. Cirrhotic morphology of the liver with moderate to large volume of ascites in the abdomen and pelvis. Multiple tortuous distal esophageal  and perigastric vessels consistent with varices 2. Indistinct appearance of the third portion of duodenum with mild wall thickening, suggestive of duodenitis. Ascites fluid around the pancreas, suspect that this is related to generalized ascites as opposed to pancreatitis 3. Moderate to large amount of subcutaneous edema and fluid within the left greater than right flank. Electronically Signed   By: Donavan Foil M.D.   On: 10/07/2017 22:01   US Abdomen Limited Ruq  Result Date: 10/07/2017 CLINICAL DATA:  Initial evaluation for acute right upper quadrant pain. EXAM: ULTRASOUND ABDOMEN LIMITED RIGHT UPPER QUADRANT COMPARISON:  Prior CT from earlier the same day. FINDINGS: Gallbladder: Gallbladder contracted. No echogenic stones identified. Wall is thickened up to 7.2 mm, like related incomplete distension and/or hepatic  cirrhosis with ascites. Positive sonographic Murphy sign elicited on exam. Common bile duct: Diameter: 4.8 mm Liver: Liver demonstrates a coarse echogenic echotexture with nodular contour, compatible with cirrhosis. No discrete hepatic masses. Portal vein is patent on color Doppler imaging with hepatofugal blood flow away from the liver. Moderate volume ascites noted within the partially visualized abdomen. IMPRESSION: 1. Hepatic cirrhosis with moderate volume ascites and hepatofugal portal venous flow. 2. Contracted gallbladder without cholelithiasis or biliary dilatation. Gallbladder wall thickening felt to be related to incomplete distension and/or ascites/cirrhosis. Positive sonographic Percell Miller sign was elicited on exam. Electronically Signed   By: Jeannine Boga M.D.   On: 10/07/2017 22:27   Ir Paracentesis  Result Date: 10/08/2017 INDICATION: History of hepatitis-C with cirrhosis. Ascites present on recent CT scan. Request is made for diagnostic and therapeutic paracentesis. EXAM: ULTRASOUND GUIDED DIAGNOSTIC AND THERAPEUTIC PARACENTESIS MEDICATIONS: 2% lidocaine COMPLICATIONS: None immediate. PROCEDURE: Informed written consent was obtained from the patient after a discussion of the risks, benefits and alternatives to treatment. A timeout was performed prior to the initiation of the procedure. Initial ultrasound scanning demonstrates a small amount of ascites within the right upper abdominal quadrant. The right upper abdomen was prepped and draped in the usual sterile fashion. 2% lidocaine was used for local anesthesia. Following this, a 19 gauge, 15-cm, Yueh catheter was introduced. An ultrasound image was saved for documentation purposes. The paracentesis was performed. The catheter was removed and a dressing was applied. The patient tolerated the procedure well without immediate post procedural complication. FINDINGS: A total of approximately 2 L of slightly cloudy serous, orange colored fluid was  removed. Samples were sent to the laboratory as requested by the clinical team. IMPRESSION: Successful ultrasound-guided paracentesis yielding 2 liters of peritoneal fluid. Read by: Saverio Danker, PA-C Electronically Signed   By: Marybelle Killings M.D.   On: 10/08/2017 12:57     Scheduled Meds: . cholecalciferol  2,000 Units Oral Daily  . darifenacin  7.5 mg Oral Daily  . ferrous sulfate  325 mg Oral Q breakfast  . furosemide  40 mg Oral Daily  . lactulose  30 g Oral TID  . rifaximin  550 mg Oral BID   Continuous Infusions: . albumin human       LOS: 0 days   Time Spent in minutes   45 minutes  Saylor Sheckler D.O. on 10/09/2017 at 1:10 PM  Between 7am to 7pm - Pager - [phone]  After 7pm go to [email or web address] - password [email or web address]  And look for the night coverage person covering for me after hours  [facility] Office  [phone]

## 2017-10-10 ENCOUNTER — Encounter (HOSPITAL_COMMUNITY)
Admission: EM | Disposition: A | Discharge status: Home / Self Care | Payer: Self-pay | Source: Home / Self Care | Attending: Internal Medicine

## 2017-10-10 ENCOUNTER — Encounter (HOSPITAL_COMMUNITY): Payer: Self-pay | Admitting: *Deleted

## 2017-10-10 LAB — COMPREHENSIVE METABOLIC PANEL
ALK PHOS: 78 U/L (ref 38–126)
ALT: 60 U/L — ABNORMAL HIGH (ref 14–54)
ANION GAP: 8 (ref 5–15)
AST: 113 U/L — ABNORMAL HIGH (ref 15–41)
Albumin: 1.9 g/dL — ABNORMAL LOW (ref 3.5–5.0)
BILIRUBIN TOTAL: 4.5 mg/dL — AB (ref 0.3–1.2)
BUN: 40 mg/dL — ABNORMAL HIGH (ref 6–20)
CALCIUM: 8.3 mg/dL — AB (ref 8.9–10.3)
CO2: 20 mmol/L — AB (ref 22–32)
Chloride: 104 mmol/L (ref 101–111)
Creatinine, Ser: 1.81 mg/dL — ABNORMAL HIGH (ref 0.44–1.00)
GFR, EST AFRICAN AMERICAN: 34 mL/min — AB (ref >60)
GFR, EST NON AFRICAN AMERICAN: 29 mL/min — AB (ref >60)
Glucose, Bld: 98 mg/dL (ref 65–99)
Potassium: 5.2 mmol/L — ABNORMAL HIGH (ref 3.5–5.1)
Sodium: 132 mmol/L — ABNORMAL LOW (ref 135–145)
TOTAL PROTEIN: 5.8 g/dL — AB (ref 6.5–8.1)

## 2017-10-10 LAB — CBC
HCT: 23.9 % — ABNORMAL LOW (ref 36.0–46.0)
HEMOGLOBIN: 8.7 g/dL — AB (ref 12.0–15.0)
MCH: 33.7 pg (ref 26.0–34.0)
MCHC: 36.4 g/dL — AB (ref 30.0–36.0)
MCV: 92.6 fL (ref 78.0–100.0)
Platelets: 97 10*3/uL — ABNORMAL LOW (ref 150–400)
RBC: 2.58 MIL/uL — ABNORMAL LOW (ref 3.87–5.11)
RDW: 17.9 % — ABNORMAL HIGH (ref 11.5–15.5)
WBC: 9.3 10*3/uL (ref 4.0–10.5)

## 2017-10-10 LAB — PROTIME-INR
INR: 2.16
PROTHROMBIN TIME: 23.9 s — AB (ref 11.4–15.2)

## 2017-10-10 LAB — GLUCOSE, CAPILLARY
Glucose-Capillary: 85 mg/dL (ref 65–99)
Glucose-Capillary: 80 mg/dL (ref 65–99)
Glucose-Capillary: 115 mg/dL — ABNORMAL HIGH (ref 65–99)

## 2017-10-10 LAB — APTT: APTT: 42 s — AB (ref 24–36)

## 2017-10-10 SURGERY — CANCELLED PROCEDURE

## 2017-10-10 MED ORDER — SODIUM CHLORIDE 0.9% FLUSH
10.0000 mL | INTRAVENOUS | Status: DC | PRN
  Administered 2017-10-11: 10 mL
  Administered 2017-10-15: 20 mL
  Filled 2017-10-10 (×2): qty 40

## 2017-10-10 MED ORDER — SODIUM CHLORIDE 0.9 % IV SOLN: INTRAVENOUS | Status: DC

## 2017-10-10 SURGICAL SUPPLY — 14 items

## 2017-10-10 NOTE — Plan of Care (Signed)
  Progressing Health Behavior/Discharge Planning: Ability to manage health-related needs will improve 10/10/2017 1157 - Progressing by Rance Muir, RN Clinical Measurements: Ability to maintain clinical measurements within normal limits will improve 10/10/2017 1157 - Progressing by Rance Muir, RN Will remain free from infection 10/10/2017 1157 - Progressing by Rance Muir, RN Diagnostic test results will improve 10/10/2017 1157 - Progressing by Rance Muir, RN Respiratory complications will improve 10/10/2017 1157 - Progressing by Rance Muir, RN Cardiovascular complication will be avoided 10/10/2017 1157 - Progressing by Rance Muir, RN Activity: Risk for activity intolerance will decrease 10/10/2017 1157 - Progressing by Rance Muir, RN Nutrition: Adequate nutrition will be maintained 10/10/2017 1157 - Progressing by Rance Muir, RN Coping: Level of anxiety will decrease 10/10/2017 1157 - Progressing by Rance Muir, RN Elimination: Will not experience complications related to bowel motility 10/10/2017 1157 - Progressing by Rance Muir, RN Will not experience complications related to urinary retention 10/10/2017 1157 - Progressing by Rance Muir, RN Pain Managment: General experience of comfort will improve 10/10/2017 1157 - Progressing by Rance Muir, RN Safety: Ability to remain free from injury will improve 10/10/2017 1157 - Progressing by Rance Muir, RN Skin Integrity: Risk for impaired skin integrity will decrease 10/10/2017 1157 - Progressing by Rance Muir, RN

## 2017-10-10 NOTE — H&P (View-Only) (Signed)
Subjective: No acute events.  Objective: Vital signs in last 24 hours: Temp:  [97.9 F (36.6 C)-98.4 F (36.9 C)] 97.9 F (36.6 C) (01/24 1414) Pulse Rate:  [87-88] 87 (01/24 0425) Resp:  [14-18] 14 (01/24 1414) BP: (133-143)/(50-81) 136/53 (01/24 1414) SpO2:  [99 %-100 %] 100 % (01/24 1414) Last BM Date: 10/09/17  Intake/Output from previous day: No intake/output data recorded. Intake/Output this shift: No intake/output data recorded.  General appearance: alert and no distress GI: soft, non-tender; bowel sounds normal; no masses,  no organomegaly  Lab Results: Recent Labs    10/08/17 0943  10/09/17 0612 10/09/17 1328 10/10/17 0703  WBC 12.3*  --  10.9*  --  9.3  HGB 10.4*   < > 9.1* 9.7* 8.7*  HCT 29.0*   < > 25.0* 26.2* 23.9*  PLT 112*  --  108*  --  97*   < > = values in this interval not displayed.   BMET Recent Labs    10/08/17 1317 10/09/17 0612 10/10/17 0703  NA 133* 134* 132*  K 5.3* 5.5* 5.2*  CL 105 106 104  CO2 18* 20* 20*  GLUCOSE 86 102* 98  BUN 41* 42* 40*  CREATININE 1.79* 2.03* 1.81*  CALCIUM 8.2* 8.1* 8.3*   LFT Recent Labs    10/10/17 0703  PROT 5.8*  ALBUMIN 1.9*  AST 113*  ALT 60*  ALKPHOS 78  BILITOT 4.5*   PT/INR Recent Labs    10/10/17 0703  LABPROT 23.9*  INR 2.16   Hepatitis Panel No results for input(s): HEPBSAG, HCVAB, HEPAIGM, HEPBIGM in the last 72 hours. C-Diff No results for input(s): CDIFFTOX in the last 72 hours. Fecal Lactopherrin No results for input(s): FECLLACTOFRN in the last 72 hours.  Studies/Results: Nm Hepatobiliary Liver Func  Result Date: 10/08/2017 CLINICAL DATA:  RIGHT upper quadrant pain question cholecystitis, history of cirrhosis, hepatitis-C EXAM: NUCLEAR MEDICINE HEPATOBILIARY IMAGING TECHNIQUE: Sequential images of the abdomen were obtained out to 60 minutes following intravenous administration of radiopharmaceutical. Following 1 hour of imaging, patient was injected with morphine and  imaging was continued for 30 minutes. RADIOPHARMACEUTICALS:  4.2 mCi Tc-5m  Choletec IV COMPARISON:  None FINDINGS: Normal tracer extraction from bloodstream indicating normal hepatocellular function. Normal excretion of tracer into biliary tree. Gallbladder did not visualize after 1 hour of imaging. Following morphine and an additional 30 minutes of imaging, the gallbladder failed to visualize. Small bowel was visualized by 20 minutes. Somewhat prolonged hepatic retention of tracer consistent with hepatocellular dysfunction. IMPRESSION: Nonvisualization of the gallbladder despite morphine augmentation consistent with acute cholecystitis. Prolonged hepatic retention of tracer consistent with hepatocellular dysfunction; recommend correlation with LFTs. Electronically Signed   By: Lavonia Dana M.D.   On: 10/08/2017 19:15    Medications:  Scheduled: . [MAR Hold] cholecalciferol  2,000 Units Oral Daily  . [MAR Hold] darifenacin  7.5 mg Oral Daily  . [MAR Hold] ferrous sulfate  325 mg Oral Q breakfast  . [MAR Hold] furosemide  40 mg Oral Daily  . [MAR Hold] lactulose  30 g Oral TID  . [MAR Hold] rifaximin  550 mg Oral BID   Continuous: . sodium chloride    . [MAR Hold] piperacillin-tazobactam (ZOSYN)  IV 3.375 g (10/10/17 1027)    Assessment/Plan: 1) Decompensated cirrhosis. 2) Ascites. 3) LE edema. 4) Epigastric abdominal pain. 5) Possible cholecystitis.   Unable to perform an EGD as she did not have IV access.  Multiple attempts to obtain access in the endoscopy unit  failed.    Plan: 1) Continue with current medications. 2) PICC line. 3) Daily weights to follow diuresis.  LOS: 1 day   Elizabeth Mclean D 10/10/2017, 3:37 PM

## 2017-10-10 NOTE — Progress Notes (Signed)
    CC:  Fluid overload, abdominal pain  Subjective: Pt says she is OK, she is tender all over to palpation this AM, but no one place more so than another.    Objective: Vital signs in last 24 hours: Temp:  [98 F (36.7 C)-98.4 F (36.9 C)] 98.4 F (36.9 C) (01/24 0425) Pulse Rate:  [82-88] 87 (01/24 0425) Resp:  [18] 18 (01/24 0425) BP: (131-143)/(50-81) 143/50 (01/24 0425) SpO2:  [97 %-100 %] 99 % (01/24 0425) Last BM Date: 10/09/17  Intake/Output from previous day: No intake/output data recorded. Intake/Output this shift: No intake/output data recorded.  General appearance: alert, cooperative and no distress GI: soft, some diffuse tenderness with palpation but not focal.  Lab Results:  Recent Labs    10/09/17 0612 10/09/17 1328 10/10/17 0703  WBC 10.9*  --  9.3  HGB 9.1* 9.7* 8.7*  HCT 25.0* 26.2* 23.9*  PLT 108*  --  97*    BMET Recent Labs    10/09/17 0612 10/10/17 0703  NA 134* 132*  K 5.5* 5.2*  CL 106 104  CO2 20* 20*  GLUCOSE 102* 98  BUN 42* 40*  CREATININE 2.03* 1.81*  CALCIUM 8.1* 8.3*   PT/INR Recent Labs    10/10/17 0703  LABPROT 23.9*  INR 2.16    Recent Labs  Lab 10/07/17 1512 10/08/17 1317 10/09/17 0612 10/10/17 0703  AST 166* 151* 129* 113*  ALT 79* 75* 64* 60*  ALKPHOS 120 104 83 78  BILITOT 4.6* 4.4* 4.5* 4.5*  PROT 7.0 6.7 5.9* 5.8*  ALBUMIN 2.0* 1.9* 1.7* 1.9*     Lipase     Component Value Date/Time   LIPASE 55 (H) 10/07/2017 1512     Medications: . cholecalciferol  2,000 Units Oral Daily  . darifenacin  7.5 mg Oral Daily  . ferrous sulfate  325 mg Oral Q breakfast  . furosemide  40 mg Oral Daily  . lactulose  30 g Oral TID  . rifaximin  550 mg Oral BID   . piperacillin-tazobactam (ZOSYN)  IV 3.375 g (10/10/17 0300)   Anti-infectives (From admission, onward)   Start     Dose/Rate Route Frequency Ordered Stop   10/09/17 1800  piperacillin-tazobactam (ZOSYN) IVPB 3.375 g     3.375 g 12.5 mL/hr over  240 Minutes Intravenous Every 8 hours 10/09/17 1629     10/08/17 1000  rifaximin (XIFAXAN) tablet 550 mg     550 mg Oral 2 times daily 10/08/17 0253        Assessment/Plan Hepatitis C cirrhosis. HIDA positive -possible acute cholecystitis Fluid overload secondary to cirrhosis  Hypertension Dyspnea on exertion/bilateral lower extremity edema Anemia -stable Thrombocytopenia Acute on chronic kidney disease FEN:  NPO for EGD ID:  Zosyn 1/23 =>> day 2 DVT:  SCD Follow up:  TBD  Plan:  Antibiotics and watch.  EGD today aslo.           LOS: 1 day    Josep Luviano 10/10/2017 [phone]

## 2017-10-10 NOTE — Progress Notes (Signed)
Subjective: No acute events.  Objective: Vital signs in last 24 hours: Temp:  [97.9 F (36.6 C)-98.4 F (36.9 C)] 97.9 F (36.6 C) (01/24 1414) Pulse Rate:  [87-88] 87 (01/24 0425) Resp:  [14-18] 14 (01/24 1414) BP: (133-143)/(50-81) 136/53 (01/24 1414) SpO2:  [99 %-100 %] 100 % (01/24 1414) Last BM Date: 10/09/17  Intake/Output from previous day: No intake/output data recorded. Intake/Output this shift: No intake/output data recorded.  General appearance: alert and no distress GI: soft, non-tender; bowel sounds normal; no masses,  no organomegaly  Lab Results: Recent Labs    10/08/17 0943  10/09/17 0612 10/09/17 1328 10/10/17 0703  WBC 12.3*  --  10.9*  --  9.3  HGB 10.4*   < > 9.1* 9.7* 8.7*  HCT 29.0*   < > 25.0* 26.2* 23.9*  PLT 112*  --  108*  --  97*   < > = values in this interval not displayed.   BMET Recent Labs    10/08/17 1317 10/09/17 0612 10/10/17 0703  NA 133* 134* 132*  K 5.3* 5.5* 5.2*  CL 105 106 104  CO2 18* 20* 20*  GLUCOSE 86 102* 98  BUN 41* 42* 40*  CREATININE 1.79* 2.03* 1.81*  CALCIUM 8.2* 8.1* 8.3*   LFT Recent Labs    10/10/17 0703  PROT 5.8*  ALBUMIN 1.9*  AST 113*  ALT 60*  ALKPHOS 78  BILITOT 4.5*   PT/INR Recent Labs    10/10/17 0703  LABPROT 23.9*  INR 2.16   Hepatitis Panel No results for input(s): HEPBSAG, HCVAB, HEPAIGM, HEPBIGM in the last 72 hours. C-Diff No results for input(s): CDIFFTOX in the last 72 hours. Fecal Lactopherrin No results for input(s): FECLLACTOFRN in the last 72 hours.  Studies/Results: Nm Hepatobiliary Liver Func  Result Date: 10/08/2017 CLINICAL DATA:  RIGHT upper quadrant pain question cholecystitis, history of cirrhosis, hepatitis-C EXAM: NUCLEAR MEDICINE HEPATOBILIARY IMAGING TECHNIQUE: Sequential images of the abdomen were obtained out to 60 minutes following intravenous administration of radiopharmaceutical. Following 1 hour of imaging, patient was injected with morphine and  imaging was continued for 30 minutes. RADIOPHARMACEUTICALS:  4.2 mCi Tc-46m  Choletec IV COMPARISON:  None FINDINGS: Normal tracer extraction from bloodstream indicating normal hepatocellular function. Normal excretion of tracer into biliary tree. Gallbladder did not visualize after 1 hour of imaging. Following morphine and an additional 30 minutes of imaging, the gallbladder failed to visualize. Small bowel was visualized by 20 minutes. Somewhat prolonged hepatic retention of tracer consistent with hepatocellular dysfunction. IMPRESSION: Nonvisualization of the gallbladder despite morphine augmentation consistent with acute cholecystitis. Prolonged hepatic retention of tracer consistent with hepatocellular dysfunction; recommend correlation with LFTs. Electronically Signed   By: Lavonia Dana M.D.   On: 10/08/2017 19:15    Medications:  Scheduled: . [MAR Hold] cholecalciferol  2,000 Units Oral Daily  . [MAR Hold] darifenacin  7.5 mg Oral Daily  . [MAR Hold] ferrous sulfate  325 mg Oral Q breakfast  . [MAR Hold] furosemide  40 mg Oral Daily  . [MAR Hold] lactulose  30 g Oral TID  . [MAR Hold] rifaximin  550 mg Oral BID   Continuous: . sodium chloride    . [MAR Hold] piperacillin-tazobactam (ZOSYN)  IV 3.375 g (10/10/17 1027)    Assessment/Plan: 1) Decompensated cirrhosis. 2) Ascites. 3) LE edema. 4) Epigastric abdominal pain. 5) Possible cholecystitis.   Unable to perform an EGD as she did not have IV access.  Multiple attempts to obtain access in the endoscopy unit  failed.    Plan: 1) Continue with current medications. 2) PICC line. 3) Daily weights to follow diuresis.  LOS: 1 day   Alister Staver D 10/10/2017, 3:37 PM

## 2017-10-10 NOTE — Progress Notes (Signed)
Initial Nutrition Assessment  DOCUMENTATION CODES:   Morbid obesity  INTERVENTION:    Advance diet as medically appropriate  RD to add supplements when/as able  NUTRITION DIAGNOSIS:   Increased nutrient needs related to chronic illness, acute illness as evidenced by estimated needs  GOAL:   Patient will meet greater than or equal to 90% of their needs  MONITOR:   Diet advancement, PO intake, Supplement acceptance, Labs, Weight trends, Skin, I & O's  REASON FOR ASSESSMENT:   Consult Assessment of nutrition requirement/status  ASSESSMENT:   62 y.o. Female with PMH significant of HCV, cirrhosis, HTN.  Patient presented to the ED with c/o epigastric / RUQ pain.  Symptoms onset around 1 week ago.  Associated vomiting and diarrhea.  Worse with eating.  Dark colored vomit and stools.  Also reports peripheral edema, abdominal distention, and some SOB with exertion.  RD and Dietetic Intern spoke with in room. Sitting on recliner. Reports her PO intake has been decreased x 2 weeks. Tries to follow a 2 gm sodium diet. Also was experiencing N/V/D, abdominal pain and "sick spells".  Reveals she consumed Glucerna Shakes when she couldn't tolerate solid food. Pt also mentioned she was seeing an RD at one point in time at [facility].  She is currently NPO for EGD procedure. To advance to Clear Liquid diet after.  Medications include Lasix, Vitamin D, Zofran and ferrous sulfate. Labs reviewed. Na 132 (L). K 5.2 (H). AST 113 (H). ALT 60 (H). CBG's 92-91.  NUTRITION - FOCUSED PHYSICAL EXAM:    Most Recent Value  Orbital Region  No depletion  Upper Arm Region  No depletion  Thoracic and Lumbar Region  No depletion  Buccal Region  No depletion  Temple Region  No depletion  Clavicle Bone Region  No depletion  Clavicle and Acromion Bone Region  No depletion  Scapular Bone Region  No depletion  Dorsal Hand  No depletion  Patellar Region  No depletion  Anterior Thigh Region  No  depletion  Posterior Calf Region  No depletion  Edema (RD Assessment)  Severe [R & L lower extremities,  ascites ]     Diet Order:  Diet NPO time specified  EDUCATION NEEDS:   No education needs have been identified at this time  Skin:  Skin Assessment: Reviewed RN Assessment  Last BM:  1/23  Height:   Ht Readings from Last 1 Encounters:  10/07/17 5\' 7"  (1.702 m)   Weight:   Wt Readings from Last 1 Encounters:  10/08/17 297 lb 13.5 oz (135.1 kg)   Wt Readings from Last 10 Encounters:  10/08/17 297 lb 13.5 oz (135.1 kg)  09/04/17 260 lb (117.9 kg)  07/17/17 260 lb (117.9 kg)  06/25/17 298 lb 9.6 oz (135.4 kg)  06/21/17 296 lb 14.4 oz (134.7 kg)  04/10/17 298 lb 1.6 oz (135.2 kg)  03/12/17 293 lb 3.2 oz (133 kg)  01/28/17 285 lb 8 oz (129.5 kg)  06/13/16 287 lb 1.6 oz (130.2 kg)  05/03/16 287 lb 2 oz (130.2 kg)   Ideal Body Weight:  61.3 kg  BMI:  Body mass index is 46.65 kg/m. likely skewed due to fluid  Estimated Nutritional Needs:   Kcal:  2000-2200  Protein:  100-115 gm  Fluid:  per MD  Arthur Holms, RD, LDN Pager #: [phone] After-Hours Pager #: [phone]

## 2017-10-10 NOTE — Progress Notes (Signed)
PROGRESS NOTE    Elizabeth Mclean  ZOX:[id number] DOB: 03-21-1956 DOA: 10/07/2017 PCP: Kerin Perna, NP   Chief Complaint  Patient presents with  . Abdominal Pain    Brief Narrative:  HPI on 10/08/2017 by Dr. Crist Fat is a 62 y.o. female with medical history significant of HCV, cirrhosis, HTN.  Patient presents to the ED with c/o epigastric / RUQ pain.  Symptoms onset around 1 week ago.  Associated vomiting and diarrhea.  Worse with eating.  9/10, non-radiating.  Dark colored vomit and stools.  Also reports peripheral edema, abdominal distention, and some SOB with exertion.  Interim history Patient admitted for abdominal pain. Found to have positive FOBT. Also had HIDA scan showing acute cholecystitis. Gastroenterology consultation appreciated. Patient also had paracentesis with 2 L removed. Plan for EGD today.   Assessment & Plan   Right upper quadrant/epigastric abdominal pain/Possible acute cholecystitis  -Gastroenterology consultation appreciated -Right upper quadrant ultrasound showing thickened gallbladder, positive Murphy sign -CT abdomen and pelvis showed possible duodenitis -HIDA scan: Nonvisualization of the gallbladder despite morphine augmentation consistent with acute cholecystitis -Discussed with Dr. Benson Norway. Recommended surgical consultation. Possibility of EGD. -General surgery consulted and appreciated. Recommended starting IV Zosyn and possibly drain placement by interventional radiology. Will continue to monitor.  Decompensated Cirrhosis with anasarca -CT abdomen and pelvis showed cirrhotic morphology of the liver with moderate to large volume ascites in the abdomen and pelvis. -Interventional radiology consultation appreciated, status post paracentesis showing 2 L -Continue lasix (hold spironolactone due to hyperkalemia) -Continue lactulose and rifaximin -Monitor daily weights, intake and output -Fluid gram stain showed no organisms  seen; Fluid culture shows no growth to date  Occult blood positive stool -hemoglobin currently 8.7, appears to be stable. Baseline hemoglobin 9-10 -GI consulted, plan for EGD today -Continue to monitor CBC  Thrombocytopenia -likely secondary to cirrhosis  -Platelets currently 97, baseline prostate 70-80 -Continue monitor CBC  Acute kidney injury on chronic kidney disease, stage III -Creatinine trending downward, currently 1.81 ( baseline approximately 1.4-1.6) -Albumin was given on 10/09/2017 -Monitor BMP  Hyperkalemia -Likely secondary to spironolactone, potassium down to 5.2  -will continue to monitor closely -Continue lactulose and Lasix  Hypoalbuminemia -albumin 1.7 -As above, albumin given -Nutrition consulted and appreciated  Morbid obesity -BMI 46.64 -Nutrition consultation appreciated  DVT Prophylaxis  SCDs  Code Status: Full  Family Communication: None at bedside  Disposition Plan: Admitted. Pending EGD today and further recommendations from general surgery  Consultants Gastroenterology General surgery  Procedures  HIDA US guided therapeutic paracentesis   Antibiotics   Anti-infectives (From admission, onward)   Start     Dose/Rate Route Frequency Ordered Stop   10/09/17 1800  piperacillin-tazobactam (ZOSYN) IVPB 3.375 g     3.375 g 12.5 mL/hr over 240 Minutes Intravenous Every 8 hours 10/09/17 1629     10/08/17 1000  rifaximin (XIFAXAN) tablet 550 mg     550 mg Oral 2 times daily 10/08/17 0253        Subjective:   Elizabeth Mclean seen and examined today.  Patient is to have some abdominal pain but states it is improved as compared to previous days. Denies any current nausea or vomiting. Continues to have bowel movements. Denies chest pain, shortness of breath, dizziness or headache.  Objective:   Vitals:   10/09/17 0559 10/09/17 1316 10/09/17 1944 10/10/17 0425  BP: (!) 117/50 (!) 131/54 133/81 (!) 143/50  Pulse: 84 82 88 87  Resp: 16 18  18  18  Temp: 98.6 F (37 C) 98 F (36.7 C) 98.2 F (36.8 C) 98.4 F (36.9 C)  TempSrc: Oral Oral Oral Oral  SpO2: 98% 97% 100% 99%  Weight:      Height:        Intake/Output Summary (Last 24 hours) at 10/10/2017 1134 Last data filed at 10/10/2017 0900 Gross per 24 hour  Intake 0 ml  Output -  Net 0 ml   Filed Weights   10/07/17 1459 10/08/17 1400  Weight: 117.9 kg (260 lb) 135.1 kg (297 lb 13.5 oz)   Exam  General: Well developed, well nourished, NAD, appears stated age  HEENT: NCAT, mucous membranes moist.   Cardiovascular: S1 S2 auscultated, RRR, no murmurs.  Respiratory: Clear to auscultation bilaterally with equal chest rise  Abdomen: Soft, obese, mild TTP, nondistended, + bowel sounds  Extremities: warm dry without cyanosis clubbing. +LE edema.   Neuro: AAOx3, nonfocal  Psych: Appropriate mood and affect, pleasant  Data Reviewed: I have personally reviewed following labs and imaging studies  CBC: Recent Labs  Lab 10/07/17 1512 10/08/17 0741 10/08/17 0943  10/08/17 1915 10/09/17 0049 10/09/17 0612 10/09/17 1328 10/10/17 0703  WBC 12.2* 11.9* 12.3*  --   --   --  10.9*  --  9.3  NEUTROABS  --   --  10.1*  --   --   --  8.6*  --   --   HGB 10.0* 9.8* 10.4*   < > 10.2* 8.8* 9.1* 9.7* 8.7*  HCT 28.0* 27.0* 29.0*   < > 28.1* 24.4* 25.0* 26.2* 23.9*  MCV 93.0 92.2 93.9  --   --   --  92.6  --  92.6  PLT 74* PLATELET CLUMPS NOTED ON SMEAR, UNABLE TO ESTIMATE 112*  --   --   --  108*  --  97*   < > = values in this interval not displayed.   Basic Metabolic Panel: Recent Labs  Lab 10/07/17 1512 10/08/17 0943 10/08/17 1317 10/09/17 0612 10/10/17 0703  NA 132*  --  133* 134* 132*  K 5.0  --  5.3* 5.5* 5.2*  CL 104  --  105 106 104  CO2 20*  --  18* 20* 20*  GLUCOSE 133*  --  86 102* 98  BUN 37*  --  41* 42* 40*  CREATININE 1.66*  --  1.79* 2.03* 1.81*  CALCIUM 8.0*  --  8.2* 8.1* 8.3*  MG  --  2.0  --  2.1  --   PHOS  --  3.9  --  4.4  --     GFR: Estimated Creatinine Clearance: 46.9 mL/min (A) (by C-G formula based on SCr of 1.81 mg/dL (H)). Liver Function Tests: Recent Labs  Lab 10/07/17 1512 10/08/17 1317 10/09/17 0612 10/10/17 0703  AST 166* 151* 129* 113*  ALT 79* 75* 64* 60*  ALKPHOS 120 104 83 78  BILITOT 4.6* 4.4* 4.5* 4.5*  PROT 7.0 6.7 5.9* 5.8*  ALBUMIN 2.0* 1.9* 1.7* 1.9*   Recent Labs  Lab 10/07/17 1512  LIPASE 55*   Recent Labs  Lab 10/08/17 1317  AMMONIA 20   Coagulation Profile: Recent Labs  Lab 10/10/17 0703  INR 2.16   Cardiac Enzymes: No results for input(s): CKTOTAL, CKMB, CKMBINDEX, TROPONINI in the last 168 hours. BNP (last 3 results) No results for input(s): PROBNP in the last 8760 hours. HbA1C: No results for input(s): HGBA1C in the last 72 hours. CBG: Recent Labs  Lab 10/09/17  1314 10/09/17 1640  GLUCAP 92 91   Lipid Profile: No results for input(s): CHOL, HDL, LDLCALC, TRIG, CHOLHDL, LDLDIRECT in the last 72 hours. Thyroid Function Tests: No results for input(s): TSH, T4TOTAL, FREET4, T3FREE, THYROIDAB in the last 72 hours. Anemia Panel: No results for input(s): VITAMINB12, FOLATE, FERRITIN, TIBC, IRON, RETICCTPCT in the last 72 hours. Urine analysis:    Component Value Date/Time   COLORURINE AMBER (A) 10/08/2017 0700   APPEARANCEUR HAZY (A) 10/08/2017 0700   LABSPEC 1.034 (H) 10/08/2017 0700   PHURINE 5.0 10/08/2017 0700   GLUCOSEU NEGATIVE 10/08/2017 0700   HGBUR NEGATIVE 10/08/2017 0700   BILIRUBINUR NEGATIVE 10/08/2017 0700   KETONESUR NEGATIVE 10/08/2017 0700   PROTEINUR NEGATIVE 10/08/2017 0700   UROBILINOGEN 1.0 12/13/2013 1432   NITRITE NEGATIVE 10/08/2017 0700   LEUKOCYTESUR NEGATIVE 10/08/2017 0700   Sepsis Labs: @LABRCNTIP (procalcitonin:4,lacticidven:4)  ) Recent Results (from the past 240 hour(s))  Culture, body fluid-bottle     Status: None (Preliminary result)   Collection Time: 10/08/17  1:20 PM  Result Value Ref Range Status   Specimen  Description PERITONEAL  Final   Special Requests NONE  Final   Culture NO GROWTH 2 DAYS  Final   Report Status PENDING  Incomplete  Gram stain     Status: None   Collection Time: 10/08/17  1:20 PM  Result Value Ref Range Status   Specimen Description PERITONEAL  Final   Special Requests NONE  Final   Gram Stain   Final    FEW WBC PRESENT, PREDOMINANTLY MONONUCLEAR NO ORGANISMS SEEN    Report Status 10/08/2017 FINAL  Final      Radiology Studies: Nm Hepatobiliary Liver Func  Result Date: 10/08/2017 CLINICAL DATA:  RIGHT upper quadrant pain question cholecystitis, history of cirrhosis, hepatitis-C EXAM: NUCLEAR MEDICINE HEPATOBILIARY IMAGING TECHNIQUE: Sequential images of the abdomen were obtained out to 60 minutes following intravenous administration of radiopharmaceutical. Following 1 hour of imaging, patient was injected with morphine and imaging was continued for 30 minutes. RADIOPHARMACEUTICALS:  4.2 mCi Tc-22m  Choletec IV COMPARISON:  None FINDINGS: Normal tracer extraction from bloodstream indicating normal hepatocellular function. Normal excretion of tracer into biliary tree. Gallbladder did not visualize after 1 hour of imaging. Following morphine and an additional 30 minutes of imaging, the gallbladder failed to visualize. Small bowel was visualized by 20 minutes. Somewhat prolonged hepatic retention of tracer consistent with hepatocellular dysfunction. IMPRESSION: Nonvisualization of the gallbladder despite morphine augmentation consistent with acute cholecystitis. Prolonged hepatic retention of tracer consistent with hepatocellular dysfunction; recommend correlation with LFTs. Electronically Signed   By: Lavonia Dana M.D.   On: 10/08/2017 19:15   Ir Paracentesis  Result Date: 10/08/2017 INDICATION: History of hepatitis-C with cirrhosis. Ascites present on recent CT scan. Request is made for diagnostic and therapeutic paracentesis. EXAM: ULTRASOUND GUIDED DIAGNOSTIC AND THERAPEUTIC  PARACENTESIS MEDICATIONS: 2% lidocaine COMPLICATIONS: None immediate. PROCEDURE: Informed written consent was obtained from the patient after a discussion of the risks, benefits and alternatives to treatment. A timeout was performed prior to the initiation of the procedure. Initial ultrasound scanning demonstrates a small amount of ascites within the right upper abdominal quadrant. The right upper abdomen was prepped and draped in the usual sterile fashion. 2% lidocaine was used for local anesthesia. Following this, a 19 gauge, 15-cm, Yueh catheter was introduced. An ultrasound image was saved for documentation purposes. The paracentesis was performed. The catheter was removed and a dressing was applied. The patient tolerated the procedure well without immediate post  procedural complication. FINDINGS: A total of approximately 2 L of slightly cloudy serous, orange colored fluid was removed. Samples were sent to the laboratory as requested by the clinical team. IMPRESSION: Successful ultrasound-guided paracentesis yielding 2 liters of peritoneal fluid. Read by: Saverio Danker, PA-C Electronically Signed   By: Marybelle Killings M.D.   On: 10/08/2017 12:57     Scheduled Meds: . cholecalciferol  2,000 Units Oral Daily  . darifenacin  7.5 mg Oral Daily  . ferrous sulfate  325 mg Oral Q breakfast  . furosemide  40 mg Oral Daily  . lactulose  30 g Oral TID  . rifaximin  550 mg Oral BID   Continuous Infusions: . piperacillin-tazobactam (ZOSYN)  IV 3.375 g (10/10/17 1027)     LOS: 1 day   Time Spent in minutes   30 minutes  Elizabeth Mclean D.O. on 10/10/2017 at 11:34 AM  Between 7am to 7pm - Pager - [phone]  After 7pm go to [email or web address] - password [email or web address]  And look for the night coverage person covering for me after hours  [facility] Office  [phone]

## 2017-10-10 NOTE — Progress Notes (Signed)
Peripherally Inserted Central Catheter/Midline Placement  The IV Nurse has discussed with the patient and/or persons authorized to consent for the patient, the purpose of this procedure and the potential benefits and risks involved with this procedure.  The benefits include less needle sticks, lab draws from the catheter, and the patient may be discharged home with the catheter. Risks include, but not limited to, infection, bleeding, blood clot (thrombus formation), and puncture of an artery; nerve damage and irregular heartbeat and possibility to perform a PICC exchange if needed/ordered by physician.  Alternatives to this procedure were also discussed.  Bard Power PICC patient education guide, fact sheet on infection prevention and patient information card has been provided to patient /or left at bedside.    PICC/Midline Placement Documentation        Elizabeth Mclean 10/10/2017, 6:53 PM

## 2017-10-11 ENCOUNTER — Inpatient Hospital Stay (HOSPITAL_COMMUNITY): Payer: Medicaid Other | Admitting: Anesthesiology

## 2017-10-11 ENCOUNTER — Encounter (HOSPITAL_COMMUNITY)
Admission: EM | Disposition: A | Discharge status: Home / Self Care | Payer: Self-pay | Source: Home / Self Care | Attending: Internal Medicine

## 2017-10-11 ENCOUNTER — Encounter (HOSPITAL_COMMUNITY): Payer: Self-pay | Admitting: Anesthesiology

## 2017-10-11 ENCOUNTER — Encounter (HOSPITAL_COMMUNITY): Payer: Self-pay | Admitting: *Deleted

## 2017-10-11 HISTORY — PX: ESOPHAGOGASTRODUODENOSCOPY (EGD) WITH PROPOFOL: SHX5813

## 2017-10-11 LAB — BASIC METABOLIC PANEL
ANION GAP: 7 (ref 5–15)
BUN: 41 mg/dL — AB (ref 6–20)
CHLORIDE: 105 mmol/L (ref 101–111)
CO2: 20 mmol/L — ABNORMAL LOW (ref 22–32)
Calcium: 8.5 mg/dL — ABNORMAL LOW (ref 8.9–10.3)
Creatinine, Ser: 1.69 mg/dL — ABNORMAL HIGH (ref 0.44–1.00)
GFR calc Af Amer: 37 mL/min — ABNORMAL LOW (ref >60)
GFR, EST NON AFRICAN AMERICAN: 32 mL/min — AB (ref >60)
Glucose, Bld: 94 mg/dL (ref 65–99)
POTASSIUM: 4.6 mmol/L (ref 3.5–5.1)
SODIUM: 132 mmol/L — AB (ref 135–145)

## 2017-10-11 LAB — CBC
HCT: 24.2 % — ABNORMAL LOW (ref 36.0–46.0)
HEMOGLOBIN: 8.8 g/dL — AB (ref 12.0–15.0)
MCH: 33.8 pg (ref 26.0–34.0)
MCHC: 36.4 g/dL — ABNORMAL HIGH (ref 30.0–36.0)
MCV: 93.1 fL (ref 78.0–100.0)
PLATELETS: 93 10*3/uL — AB (ref 150–400)
RBC: 2.6 MIL/uL — AB (ref 3.87–5.11)
RDW: 17.8 % — ABNORMAL HIGH (ref 11.5–15.5)
WBC: 8.5 10*3/uL (ref 4.0–10.5)

## 2017-10-11 LAB — GLUCOSE, CAPILLARY
GLUCOSE-CAPILLARY: 96 mg/dL (ref 65–99)
GLUCOSE-CAPILLARY: 143 mg/dL — AB (ref 65–99)
GLUCOSE-CAPILLARY: 108 mg/dL — AB (ref 65–99)
Glucose-Capillary: 113 mg/dL — ABNORMAL HIGH (ref 65–99)

## 2017-10-11 SURGERY — ESOPHAGOGASTRODUODENOSCOPY (EGD) WITH PROPOFOL
Anesthesia: Monitor Anesthesia Care

## 2017-10-11 MED ORDER — LIDOCAINE HCL (CARDIAC) 20 MG/ML IV SOLN
INTRAVENOUS | Status: DC | PRN
  Administered 2017-10-11: 50 mg via INTRATRACHEAL

## 2017-10-11 MED ORDER — PROPOFOL 10 MG/ML IV BOLUS
INTRAVENOUS | Status: DC | PRN
Start: 2017-10-11 — End: 2017-10-11
  Administered 2017-10-11 (×3): 20 mg via INTRAVENOUS

## 2017-10-11 MED ORDER — SODIUM CHLORIDE 0.9 % IV SOLN
INTRAVENOUS | Status: DC | PRN
Start: 2017-10-11 — End: 2017-10-11
  Administered 2017-10-11: 08:00:00 via INTRAVENOUS

## 2017-10-11 MED ORDER — PROPOFOL 500 MG/50ML IV EMUL
INTRAVENOUS | Status: DC | PRN
Start: 2017-10-11 — End: 2017-10-11
  Administered 2017-10-11: 75 ug/kg/min via INTRAVENOUS

## 2017-10-11 MED ORDER — FUROSEMIDE 40 MG PO TABS
80.0000 mg | ORAL_TABLET | Freq: Every day | ORAL | Status: DC
  Administered 2017-10-12 – 2017-10-15 (×4): 80 mg via ORAL
  Filled 2017-10-11 (×4): qty 2

## 2017-10-11 MED ORDER — SPIRONOLACTONE 100 MG PO TABS
200.0000 mg | ORAL_TABLET | Freq: Every day | ORAL | Status: DC
  Administered 2017-10-11 – 2017-10-15 (×5): 200 mg via ORAL
  Filled 2017-10-11: qty 2
  Filled 2017-10-11 (×5): qty 8
  Filled 2017-10-11: qty 2

## 2017-10-11 SURGICAL SUPPLY — 14 items

## 2017-10-11 NOTE — Progress Notes (Signed)
PROGRESS NOTE    Elizabeth Mclean  RKY:[id number] DOB: 12-Sep-1956 DOA: 10/07/2017 PCP: Kerin Perna, NP   Chief Complaint  Patient presents with  . Abdominal Pain    Brief Narrative:  HPI on 10/08/2017 by Dr. Crist Fat is a 62 y.o. female with medical history significant of HCV, cirrhosis, HTN.  Patient presents to the ED with c/o epigastric / RUQ pain.  Symptoms onset around 1 week ago.  Associated vomiting and diarrhea.  Worse with eating.  9/10, non-radiating.  Dark colored vomit and stools.  Also reports peripheral edema, abdominal distention, and some SOB with exertion.  Interim history Patient admitted for abdominal pain. Found to have positive FOBT. Also had HIDA scan showing acute cholecystitis. Gastroenterology consultation appreciated. Patient also had paracentesis with 2 L removed. Plan for EGD today.   Assessment & Plan   Right upper quadrant/epigastric abdominal pain/Possible acute cholecystitis  -Gastroenterology consultation appreciated -Right upper quadrant ultrasound showing thickened gallbladder, positive Murphy sign -CT abdomen and pelvis showed possible duodenitis -HIDA scan: Nonvisualization of the gallbladder despite morphine augmentation consistent with acute cholecystitis -Discussed with Dr. Benson Norway. Recommended surgical consultation. Possibility of EGD. -General surgery consulted and appreciated. Recommended starting IV Zosyn and possibly drain placement by interventional radiology if pain does not improve. Will continue to monitor.  Decompensated Cirrhosis with anasarca -CT abdomen and pelvis showed cirrhotic morphology of the liver with moderate to large volume ascites in the abdomen and pelvis. -Interventional radiology consultation appreciated, status post paracentesis showing 2 L -Continue lasix (hold spironolactone due to hyperkalemia) -Continue lactulose and rifaximin -Monitor daily weights, intake and output -Fluid gram  stain showed no organisms seen; Fluid culture shows no growth to date -Noted to have small esophageal varices with portal hypertensive gastropathy on EGD. Clips (see MRI unsafe at) were placed  Occult blood positive stool -hemoglobin currently 8.8, appears to be stable. Baseline hemoglobin 9-10 -GI consulted, EGD showed small esophageal varices. Portal hypertensive gastropathy. Biopsied. Clips were placed. Gastric antral vascular ectasia without bleeding. Congested duodenal mucosa. -Continue to monitor CBC  Thrombocytopenia -likely secondary to cirrhosis  -Platelets currently 93, baseline prostate 70-80 -Continue monitor CBC  Acute kidney injury on chronic kidney disease, stage III -Creatinine trending downward, currently 1.69 ( baseline approximately 1.4-1.6) -Albumin was given on 10/09/2017 -Monitor BMP  Hyperkalemia -Likely secondary to spironolactone, potassium down to 4.6   -will continue to monitor closely -Continue lactulose and Lasix  Hypoalbuminemia -albumin 1.9 -As above, albumin given -Nutrition consulted and appreciated  Morbid obesity -BMI 46.64 -Nutrition consultation appreciated  DVT Prophylaxis  SCDs  Code Status: Full  Family Communication: None at bedside  Disposition Plan: Admitted. Pending EGD biopsy results and improvement in pain.   Consultants Gastroenterology General surgery  Procedures  HIDA US guided therapeutic paracentesis  EGD  Antibiotics   Anti-infectives (From admission, onward)   Start     Dose/Rate Route Frequency Ordered Stop   10/09/17 1800  piperacillin-tazobactam (ZOSYN) IVPB 3.375 g     3.375 g 12.5 mL/hr over 240 Minutes Intravenous Every 8 hours 10/09/17 1629     10/08/17 1000  rifaximin (XIFAXAN) tablet 550 mg     550 mg Oral 2 times daily 10/08/17 0253        Subjective:   Elizabeth Mclean seen and examined today. Continues to have some abdominal pain however states she feels hungry this morning. Denies any  recent nausea or vomiting. Denies chest pain, shortness of breath, dizziness or headache.  Objective:  Vitals:   10/11/17 0733 10/11/17 0843 10/11/17 0850 10/11/17 0900  BP: (!) 139/48 (!) 146/64 (!) 149/62   Pulse: 81 88 82   Resp: 10 13 16    Temp: 98.6 F (37 C) 98.4 F (36.9 C)    TempSrc: Oral Oral    SpO2: 100% 100% 100%   Weight:    134 kg (295 lb 6.7 oz)  Height:        Intake/Output Summary (Last 24 hours) at 10/11/2017 1307 Last data filed at 10/11/2017 0900 Gross per 24 hour  Intake 450 ml  Output -  Net 450 ml   Filed Weights   10/07/17 1459 10/08/17 1400 10/11/17 0900  Weight: 117.9 kg (260 lb) 135.1 kg (297 lb 13.5 oz) 134 kg (295 lb 6.7 oz)   Exam (no change in exam from 10/10/2017)  General: Well developed, well nourished, NAD, appears stated age  HEENT: NCAT, mucous membranes moist.   Cardiovascular: S1 S2 auscultated, RRR, no murmurs.  Respiratory: Clear to auscultation bilaterally with equal chest rise  Abdomen: Soft, obese, mild TTP, nondistended, + bowel sounds  Extremities: warm dry without cyanosis clubbing. +2LE edema.   Neuro: AAOx3, nonfocal  Psych: Appropriate mood and affect, pleasant  Data Reviewed: I have personally reviewed following labs and imaging studies  CBC: Recent Labs  Lab 10/08/17 0741 10/08/17 0943  10/09/17 0049 10/09/17 0612 10/09/17 1328 10/10/17 0703 10/11/17 0419  WBC 11.9* 12.3*  --   --  10.9*  --  9.3 8.5  NEUTROABS  --  10.1*  --   --  8.6*  --   --   --   HGB 9.8* 10.4*   < > 8.8* 9.1* 9.7* 8.7* 8.8*  HCT 27.0* 29.0*   < > 24.4* 25.0* 26.2* 23.9* 24.2*  MCV 92.2 93.9  --   --  92.6  --  92.6 93.1  PLT PLATELET CLUMPS NOTED ON SMEAR, UNABLE TO ESTIMATE 112*  --   --  108*  --  97* 93*   < > = values in this interval not displayed.   Basic Metabolic Panel: Recent Labs  Lab 10/07/17 1512 10/08/17 0943 10/08/17 1317 10/09/17 0612 10/10/17 0703 10/11/17 0419  NA 132*  --  133* 134* 132* 132*  K 5.0   --  5.3* 5.5* 5.2* 4.6  CL 104  --  105 106 104 105  CO2 20*  --  18* 20* 20* 20*  GLUCOSE 133*  --  86 102* 98 94  BUN 37*  --  41* 42* 40* 41*  CREATININE 1.66*  --  1.79* 2.03* 1.81* 1.69*  CALCIUM 8.0*  --  8.2* 8.1* 8.3* 8.5*  MG  --  2.0  --  2.1  --   --   PHOS  --  3.9  --  4.4  --   --    GFR: Estimated Creatinine Clearance: 50 mL/min (A) (by C-G formula based on SCr of 1.69 mg/dL (H)). Liver Function Tests: Recent Labs  Lab 10/07/17 1512 10/08/17 1317 10/09/17 0612 10/10/17 0703  AST 166* 151* 129* 113*  ALT 79* 75* 64* 60*  ALKPHOS 120 104 83 78  BILITOT 4.6* 4.4* 4.5* 4.5*  PROT 7.0 6.7 5.9* 5.8*  ALBUMIN 2.0* 1.9* 1.7* 1.9*   Recent Labs  Lab 10/07/17 1512  LIPASE 55*   Recent Labs  Lab 10/08/17 1317  AMMONIA 20   Coagulation Profile: Recent Labs  Lab 10/10/17 0703  INR 2.16   Cardiac Enzymes:  No results for input(s): CKTOTAL, CKMB, CKMBINDEX, TROPONINI in the last 168 hours. BNP (last 3 results) No results for input(s): PROBNP in the last 8760 hours. HbA1C: No results for input(s): HGBA1C in the last 72 hours. CBG: Recent Labs  Lab 10/10/17 1258 10/10/17 1632 10/10/17 1943 10/11/17 0557 10/11/17 1155  GLUCAP 85 80 115* 96 113*   Lipid Profile: No results for input(s): CHOL, HDL, LDLCALC, TRIG, CHOLHDL, LDLDIRECT in the last 72 hours. Thyroid Function Tests: No results for input(s): TSH, T4TOTAL, FREET4, T3FREE, THYROIDAB in the last 72 hours. Anemia Panel: No results for input(s): VITAMINB12, FOLATE, FERRITIN, TIBC, IRON, RETICCTPCT in the last 72 hours. Urine analysis:    Component Value Date/Time   COLORURINE AMBER (A) 10/08/2017 0700   APPEARANCEUR HAZY (A) 10/08/2017 0700   LABSPEC 1.034 (H) 10/08/2017 0700   PHURINE 5.0 10/08/2017 0700   GLUCOSEU NEGATIVE 10/08/2017 0700   HGBUR NEGATIVE 10/08/2017 0700   BILIRUBINUR NEGATIVE 10/08/2017 0700   KETONESUR NEGATIVE 10/08/2017 0700   PROTEINUR NEGATIVE 10/08/2017 0700    UROBILINOGEN 1.0 12/13/2013 1432   NITRITE NEGATIVE 10/08/2017 0700   LEUKOCYTESUR NEGATIVE 10/08/2017 0700   Sepsis Labs: @LABRCNTIP (procalcitonin:4,lacticidven:4)  ) Recent Results (from the past 240 hour(s))  Culture, body fluid-bottle     Status: None (Preliminary result)   Collection Time: 10/08/17  1:20 PM  Result Value Ref Range Status   Specimen Description PERITONEAL  Final   Special Requests NONE  Final   Culture NO GROWTH 2 DAYS  Final   Report Status PENDING  Incomplete  Gram stain     Status: None   Collection Time: 10/08/17  1:20 PM  Result Value Ref Range Status   Specimen Description PERITONEAL  Final   Special Requests NONE  Final   Gram Stain   Final    FEW WBC PRESENT, PREDOMINANTLY MONONUCLEAR NO ORGANISMS SEEN    Report Status 10/08/2017 FINAL  Final      Radiology Studies: No results found.   Scheduled Meds: . cholecalciferol  2,000 Units Oral Daily  . darifenacin  7.5 mg Oral Daily  . ferrous sulfate  325 mg Oral Q breakfast  . [START ON 10/12/2017] furosemide  80 mg Oral Daily  . lactulose  30 g Oral TID  . rifaximin  550 mg Oral BID  . spironolactone  200 mg Oral Daily   Continuous Infusions: . piperacillin-tazobactam (ZOSYN)  IV 3.375 g (10/11/17 0957)     LOS: 2 days   Time Spent in minutes   30 minutes  Jemarion Roycroft D.O. on 10/11/2017 at 1:07 PM  Between 7am to 7pm - Pager - [phone]  After 7pm go to [email or web address] - password [email or web address]  And look for the night coverage person covering for me after hours  [facility] Office  [phone]

## 2017-10-11 NOTE — Anesthesia Preprocedure Evaluation (Signed)
Anesthesia Evaluation  Patient identified by MRN, date of birth, ID band  Reviewed: Allergy & Precautions, NPO status , Patient's Chart, lab work & pertinent test results  Airway Mallampati: II  TM Distance: >3 FB Neck ROM: Full    Dental  (+) Dental Advisory Given, Partial Lower, Partial Upper, Poor Dentition, Chipped, Missing,    Pulmonary former smoker,    Pulmonary exam normal        Cardiovascular hypertension, Pt. on medications Normal cardiovascular exam Rhythm:Regular + Systolic murmurs- Diastolic murmurs    Neuro/Psych PSYCHIATRIC DISORDERS Depression    GI/Hepatic GERD  Controlled,(+) Hepatitis -, C  Endo/Other  diabetes, Type 2Morbid obesity  Renal/GU      Musculoskeletal  (+) Arthritis ,   Abdominal   Peds  Hematology  (+) Blood dyscrasia (thrombocytopenia), anemia ,   Anesthesia Other Findings Breast cancer s/p radiation/resection  Reproductive/Obstetrics                             Anesthesia Physical  Anesthesia Plan  ASA: III  Anesthesia Plan: MAC   Post-op Pain Management:    Induction: Intravenous  PONV Risk Score and Plan: Propofol infusion and Treatment may vary due to age or medical condition  Airway Management Planned:   Additional Equipment:   Intra-op Plan:   Post-operative Plan:   Informed Consent: I have reviewed the patients History and Physical, chart, labs and discussed the procedure including the risks, benefits and alternatives for the proposed anesthesia with the patient or authorized representative who has indicated his/her understanding and acceptance.   Dental advisory given  Plan Discussed with: CRNA  Anesthesia Plan Comments:         Anesthesia Quick Evaluation

## 2017-10-11 NOTE — Interval H&P Note (Signed)
History and Physical Interval Note:  10/11/2017 8:18 AM  Elizabeth Mclean  has presented today for surgery, with the diagnosis of epigastric pain  The various methods of treatment have been discussed with the patient and family. After consideration of risks, benefits and other options for treatment, the patient has consented to  Procedure(s): ESOPHAGOGASTRODUODENOSCOPY (EGD) WITH PROPOFOL (N/A) as a surgical intervention .  The patient's history has been reviewed, patient examined, no change in status, stable for surgery.  I have reviewed the patient's chart and labs.  Questions were answered to the patient's satisfaction.     Otilia Kareem D

## 2017-10-11 NOTE — Transfer of Care (Signed)
Immediate Anesthesia Transfer of Care Note  Patient: Elizabeth Mclean  Procedure(s) Performed: ESOPHAGOGASTRODUODENOSCOPY (EGD) WITH PROPOFOL (N/A )  Patient Location: PACU  Anesthesia Type:MAC  Level of Consciousness: awake, alert  and oriented  Airway & Oxygen Therapy: Patient Spontanous Breathing and Patient connected to nasal cannula oxygen  Post-op Assessment: Report given to RN and Post -op Vital signs reviewed and stable  Post vital signs: Reviewed and stable  Last Vitals:  Vitals:   10/11/17 0553 10/11/17 0733  BP: (!) 130/55 (!) 139/48  Pulse: 85 81  Resp: 15 10  Temp: 36.9 C 37 C  SpO2: 96% 100%    Last Pain:  Vitals:   10/11/17 0733  TempSrc: Oral  PainSc:          Complications: No apparent anesthesia complications

## 2017-10-11 NOTE — Progress Notes (Signed)
[facility]/Trauma Progress Note  Day of Surgery   Assessment/Plan Principal Problem:   RUQ abdominal pain Active Problems:   Liver cirrhosis (HCC)   Anasarca   Occult blood positive stool   Thrombocytopenia (HCC)  HIDA +, possible acute cholecystitis - continue antibiotics and if she does not improve we recommend a per chole drain - pt is not an ideal surgical candidate   FEN: clears okay to advance as tolerated to low fat diet  VTE: SCD's ID: Zosyn 01/23>> Follow up: none needed  DISPO: Surgery will sign off at this time. Please page [place] with any further needs for this pt.     LOS: 2 days    Subjective:  CC: epigastric abdominal pain  Pain is unchanged. No nausea or vomiting, fever or chills overnight. EGD this AM.   Objective: Vital signs in last 24 hours: Temp:  [97.9 F (36.6 C)-98.6 F (37 C)] 98.4 F (36.9 C) (01/25 0843) Pulse Rate:  [81-88] 82 (01/25 0850) Resp:  [10-16] 16 (01/25 0850) BP: (130-149)/(46-64) 149/62 (01/25 0850) SpO2:  [96 %-100 %] 100 % (01/25 0850) Weight:  [295 lb 6.7 oz (134 kg)] 295 lb 6.7 oz (134 kg) (01/25 0900) Last BM Date: 10/10/17  Intake/Output from previous day: 01/24 0701 - 01/25 0700 In: 180 [P.O.:120; I.V.:10; IV Piggyback:50] Out: -  Intake/Output this shift: Total I/O In: 270 [P.O.:120; I.V.:150] Out: -   PE: Gen:  Alert, NAD, pleasant, cooperative Pulm:  Rate and effort normal Abd: Soft, obese, not distended, +BS, TTP in epigastric region without guarding Skin: no rashes noted, warm and dry   Anti-infectives: Anti-infectives (From admission, onward)   Start     Dose/Rate Route Frequency Ordered Stop   10/09/17 1800  piperacillin-tazobactam (ZOSYN) IVPB 3.375 g     3.375 g 12.5 mL/hr over 240 Minutes Intravenous Every 8 hours 10/09/17 1629     10/08/17 1000  rifaximin (XIFAXAN) tablet 550 mg     550 mg Oral 2 times daily 10/08/17 0253        Lab Results:  Recent Labs    10/10/17 0703  10/11/17 0419  WBC 9.3 8.5  HGB 8.7* 8.8*  HCT 23.9* 24.2*  PLT 97* 93*   BMET Recent Labs    10/10/17 0703 10/11/17 0419  NA 132* 132*  K 5.2* 4.6  CL 104 105  CO2 20* 20*  GLUCOSE 98 94  BUN 40* 41*  CREATININE 1.81* 1.69*  CALCIUM 8.3* 8.5*   PT/INR Recent Labs    10/10/17 0703  LABPROT 23.9*  INR 2.16   CMP     Component Value Date/Time   NA 132 (L) 10/11/2017 0419   NA 138 07/22/2014 0917   K 4.6 10/11/2017 0419   K 4.3 07/22/2014 0917   CL 105 10/11/2017 0419   CL 108 (H) 01/26/2013 0914   CO2 20 (L) 10/11/2017 0419   CO2 27 07/22/2014 0917   GLUCOSE 94 10/11/2017 0419   GLUCOSE 228 (H) 07/22/2014 0917   GLUCOSE 131 (H) 01/26/2013 0914   BUN 41 (H) 10/11/2017 0419   BUN 10.2 07/22/2014 0917   CREATININE 1.69 (H) 10/11/2017 0419   CREATININE 0.8 07/22/2014 0917   CALCIUM 8.5 (L) 10/11/2017 0419   CALCIUM 8.6 07/22/2014 0917   PROT 5.8 (L) 10/10/2017 0703   PROT 6.6 07/22/2014 0917   ALBUMIN 1.9 (L) 10/10/2017 0703   ALBUMIN 2.7 (L) 07/22/2014 0917   AST 113 (H) 10/10/2017 0703   AST 130 (  H) 07/22/2014 0917   ALT 60 (H) 10/10/2017 0703   ALT 112 (H) 07/22/2014 0917   ALKPHOS 78 10/10/2017 0703   ALKPHOS 158 (H) 07/22/2014 0917   BILITOT 4.5 (H) 10/10/2017 0703   BILITOT 0.99 07/22/2014 0917   GFRNONAA 32 (L) 10/11/2017 0419   GFRAA 37 (L) 10/11/2017 0419   Lipase     Component Value Date/Time   LIPASE 55 (H) 10/07/2017 1512    Studies/Results: No results found.    Kalman Drape , [facility] Surgery 10/11/2017, 9:55 AM Pager: [phone] Consults: [phone] Mon-Fri 7:00 am-4:30 pm Sat-Sun 7:00 am-11:30 am

## 2017-10-11 NOTE — Op Note (Signed)
[facility] Patient Name: [place] Procedure Date : 10/11/2017 MRN: [id number] Attending MD: Carol Ada , MD Date of Birth: 1956/07/11 CSN: [id number] Age: 62 Admit Type: Inpatient Procedure:                Upper GI endoscopy Indications:              Epigastric abdominal pain Providers:                Carol Ada, MD, Laverta Baltimore RN, RN, Alan Mulder, Technician, Virgilio Belling. Beckner, CRNA Referring MD:              Medicines:                Propofol per Anesthesia Complications:            No immediate complications. Estimated Blood Loss:     Estimated blood loss was minimal. Procedure:                Pre-Anesthesia Assessment:                           - Prior to the procedure, a History and Physical                            was performed, and patient medications and                            allergies were reviewed. The patient's tolerance of                            previous anesthesia was also reviewed. The risks                            and benefits of the procedure and the sedation                            options and risks were discussed with the patient.                            All questions were answered, and informed consent                            was obtained. Prior Anticoagulants: The patient has                            taken no previous anticoagulant or antiplatelet                            agents. ASA Grade Assessment: III - A patient with                            severe systemic disease. After reviewing the risks  and benefits, the patient was deemed in                            satisfactory condition to undergo the procedure.                           - Sedation was administered by an anesthesia                            professional. Deep sedation was attained.                           After obtaining informed consent, the endoscope was       passed under direct vision. Throughout the                            procedure, the patient's blood pressure, pulse, and                            oxygen saturations were monitored continuously. The                            EG-[id number] ([id number]) scope was introduced through the                            mouth, and advanced to the second part of duodenum.                            The upper GI endoscopy was accomplished without                            difficulty. The patient tolerated the procedure                            well. Scope In: Scope Out: Findings:      Small (< 5 mm) varices were found in the lower third of the esophagus.      Severe portal hypertensive gastropathy was found in the entire examined       stomach. This was biopsied with a cold forceps for histology. For       hemostasis, two hemostatic clips were successfully placed (MR unsafe).       Baseline oozing.      Mild gastric antral vascular ectasia without bleeding was present in the       gastric antrum.      Diffuse moderately congested mucosa without active bleeding and with no       stigmata of bleeding was found in the entire duodenum.      In the gastric lumen, proximal body, there was evidence of two       erythematous and friable patches of mucosa. The larger patch was slowly       oozing blood, which most likely accounts for the heme positivity. These       two areas appear to be inflammatory rather than from portal HTN. The       larger patch did not appear to be an IGV (Isolated Gastric  Varix). A       cold biopsy was obtained, but this induced prolonged and greater oozing.       Two hemoclips were placed at the biposy site, which brought the bleeding       back to its baseline level. There was no evidence of fundic varices. Impression:               - Small (< 5 mm) esophageal varices.                           - Portal hypertensive gastropathy. Biopsied. Clips                            (MR  unsafe) were placed.                           - Gastric antral vascular ectasia without bleeding.                           - Congested duodenal mucosa. Moderate Sedation:      None Recommendation:           - Return patient to hospital ward for ongoing care.                           - Low sodium diet.                           - Continue present medications.                           - Await pathology results.                           - Repeat upper endoscopy in 3 years for                            surveillance. Procedure Code(s):        --- Professional ---                           40981, 50, Esophagogastroduodenoscopy, flexible,                            transoral; with control of bleeding, any method                           43239, Esophagogastroduodenoscopy, flexible,                            transoral; with biopsy, single or multiple Diagnosis Code(s):        --- Professional ---                           I85.00, Esophageal varices without bleeding                           K76.6, Portal hypertension  K31.89, Other diseases of stomach and duodenum                           K31.819, Angiodysplasia of stomach and duodenum                            without bleeding                           R10.13, Epigastric pain CPT copyright 2016 [employer]. All rights reserved. The codes documented in this report are preliminary and upon coder review may  be revised to meet current compliance requirements. Carol Ada, MD Carol Ada, MD 10/11/2017 9:06:54 AM This report has been signed electronically. Number of Addenda: 0

## 2017-10-11 NOTE — Anesthesia Postprocedure Evaluation (Signed)
Anesthesia Post Note  Patient: Elizabeth Mclean  Procedure(s) Performed: ESOPHAGOGASTRODUODENOSCOPY (EGD) WITH PROPOFOL (N/A )     Patient location during evaluation: PACU Anesthesia Type: MAC Level of consciousness: awake and alert Pain management: pain level controlled Vital Signs Assessment: post-procedure vital signs reviewed and stable Respiratory status: spontaneous breathing Cardiovascular status: stable Anesthetic complications: no    Last Vitals:  Vitals:   10/11/17 0843 10/11/17 0850  BP: (!) 146/64 (!) 149/62  Pulse: 88 82  Resp: 13 16  Temp: 36.9 C   SpO2: 100% 100%    Last Pain:  Vitals:   10/11/17 0843  TempSrc: Oral  PainSc:                  Nolon Nations

## 2017-10-12 DIAGNOSIS — R195 Other fecal abnormalities: Secondary | ICD-10-CM

## 2017-10-12 DIAGNOSIS — K7460 Unspecified cirrhosis of liver: Secondary | ICD-10-CM

## 2017-10-12 DIAGNOSIS — R188 Other ascites: Secondary | ICD-10-CM

## 2017-10-12 DIAGNOSIS — R1011 Right upper quadrant pain: Secondary | ICD-10-CM

## 2017-10-12 LAB — BASIC METABOLIC PANEL
Anion gap: 5 (ref 5–15)
BUN: 36 mg/dL — ABNORMAL HIGH (ref 6–20)
CALCIUM: 8.3 mg/dL — AB (ref 8.9–10.3)
CHLORIDE: 104 mmol/L (ref 101–111)
CO2: 21 mmol/L — ABNORMAL LOW (ref 22–32)
CREATININE: 1.98 mg/dL — AB (ref 0.44–1.00)
GFR calc non Af Amer: 26 mL/min — ABNORMAL LOW (ref >60)
GFR, EST AFRICAN AMERICAN: 30 mL/min — AB (ref >60)
Glucose, Bld: 114 mg/dL — ABNORMAL HIGH (ref 65–99)
Potassium: 4.6 mmol/L (ref 3.5–5.1)
SODIUM: 130 mmol/L — AB (ref 135–145)

## 2017-10-12 LAB — GLUCOSE, CAPILLARY
GLUCOSE-CAPILLARY: 104 mg/dL — AB (ref 65–99)
GLUCOSE-CAPILLARY: 115 mg/dL — AB (ref 65–99)
Glucose-Capillary: 95 mg/dL (ref 65–99)
Glucose-Capillary: 131 mg/dL — ABNORMAL HIGH (ref 65–99)

## 2017-10-12 LAB — CBC
HCT: 25.4 % — ABNORMAL LOW (ref 36.0–46.0)
HEMOGLOBIN: 9.2 g/dL — AB (ref 12.0–15.0)
MCH: 33.7 pg (ref 26.0–34.0)
MCHC: 36.2 g/dL — AB (ref 30.0–36.0)
MCV: 93 fL (ref 78.0–100.0)
Platelets: 93 10*3/uL — ABNORMAL LOW (ref 150–400)
RBC: 2.73 MIL/uL — ABNORMAL LOW (ref 3.87–5.11)
RDW: 17.8 % — ABNORMAL HIGH (ref 11.5–15.5)
WBC: 10 10*3/uL (ref 4.0–10.5)

## 2017-10-12 NOTE — Progress Notes (Signed)
  GI Progress Note covering for Drs. Mann & Hung   Subjective  Abdominal pain has improved. Remains distended.    Objective  Vital signs in last 24 hours: Temp:  [98.2 F (36.8 C)-98.4 F (36.9 C)] 98.3 F (36.8 C) (01/26 1457) Pulse Rate:  [82-84] 83 (01/26 1457) Resp:  [16-18] 18 (01/26 1457) BP: (117-146)/(43-60) 134/43 (01/26 1457) SpO2:  [95 %-100 %] 100 % (01/26 1457) Last BM Date: 10/12/17  General: Alert, well-developed, in NAD Heart:  Regular rate and rhythm; no murmurs Chest: Clear to ascultation bilaterally Abdomen:  Soft, nontender and moderately distended. Normal bowel sounds, without guarding, and without rebound.   Extremities:  Without edema. Neurologic:  Alert and  oriented x4; grossly normal neurologically. Psych:  Alert and cooperative. Normal mood and affect.  Intake/Output from previous day: 01/25 0701 - 01/26 0700 In: 750 [P.O.:600; I.V.:150] Out: -  Intake/Output this shift: No intake/output data recorded.  Lab Results: Recent Labs    10/10/17 0703 10/11/17 0419 10/12/17 0356  WBC 9.3 8.5 10.0  HGB 8.7* 8.8* 9.2*  HCT 23.9* 24.2* 25.4*  PLT 97* 93* 93*   BMET Recent Labs    10/10/17 0703 10/11/17 0419 10/12/17 0356  NA 132* 132* 130*  K 5.2* 4.6 4.6  CL 104 105 104  CO2 20* 20* 21*  GLUCOSE 98 94 114*  BUN 40* 41* 36*  CREATININE 1.81* 1.69* 1.98*  CALCIUM 8.3* 8.5* 8.3*   LFT Recent Labs    10/10/17 0703  PROT 5.8*  ALBUMIN 1.9*  AST 113*  ALT 60*  ALKPHOS 78  BILITOT 4.5*   PT/INR Recent Labs    10/10/17 0703  LABPROT 23.9*  INR 2.16      Assessment & Plan   1. Decompensated cirrhosis with ascites, anasarca. Cr has slightly increased to 1.98 today. EGD yesterday showed small varices and portal gastropathy that required clips following biopsies. Continue current mgmt. If Cr continues to increase will need to adjust diuretics. Consider resuming Aldactone as hyperkalemia has resolved however would wait until Cr has  stabilized.  2. RUQ pain, resolving. Suspected cholecystitis being treated with IV Zosyn. Surgery is following.    3. Heme positive stool likely from portal gastropathy. Further evaluation per Dr. Benson Norway.     LOS: 3 days   Norberto Sorenson T. Fuller Plan MD 10/12/2017, 3:14 PM

## 2017-10-12 NOTE — Progress Notes (Signed)
PROGRESS NOTE    Elizabeth Mclean  IHK:[id number] DOB: 1956-08-25 DOA: 10/07/2017 PCP: Kerin Perna, NP   Chief Complaint  Patient presents with  . Abdominal Pain    Brief Narrative:  HPI on 10/08/2017 by Dr. Crist Fat is a 62 y.o. female with medical history significant of HCV, cirrhosis, HTN.  Patient presents to the ED with c/o epigastric / RUQ pain.  Symptoms onset around 1 week ago.  Associated vomiting and diarrhea.  Worse with eating.  9/10, non-radiating.  Dark colored vomit and stools.  Also reports peripheral edema, abdominal distention, and some SOB with exertion.  Interim history Patient admitted for abdominal pain. Found to have positive FOBT. Also had HIDA scan showing acute cholecystitis. Gastroenterology consultation appreciated. Patient also had paracentesis with 2 L removed. S/p EGD.  Assessment & Plan   Right upper quadrant/epigastric abdominal pain/Possible acute cholecystitis  -Gastroenterology consultation appreciated -Right upper quadrant ultrasound showing thickened gallbladder, positive Murphy sign -CT abdomen and pelvis showed possible duodenitis -HIDA scan: Nonvisualization of the gallbladder despite morphine augmentation consistent with acute cholecystitis -Discussed with Dr. Benson Norway. Recommended surgical consultation. -General surgery consulted and appreciated. Recommended starting IV Zosyn and possibly drain placement by interventional radiology if pain does not improve. Will continue to monitor. -Abdominal pain improving. General surgery signed off  Decompensated Cirrhosis with anasarca -CT abdomen and pelvis showed cirrhotic morphology of the liver with moderate to large volume ascites in the abdomen and pelvis. -Interventional radiology consultation appreciated, status post paracentesis showing 2 L -Continue lasix (hold spironolactone due to hyperkalemia) -Continue lactulose and rifaximin -Monitor daily weights, intake and  output -Fluid gram stain showed no organisms seen; Fluid culture shows no growth to date -Noted to have small esophageal varices with portal hypertensive gastropathy on EGD. Clips (see MRI unsafe at) were placed  Occult blood positive stool -hemoglobin currently 9.2, appears to be stable. Baseline hemoglobin 9-10 -GI consulted, EGD showed small esophageal varices. Portal hypertensive gastropathy. Biopsied. Clips were placed. Gastric antral vascular ectasia without bleeding. Congested duodenal mucosa. -Continue to monitor CBC  Thrombocytopenia -likely secondary to cirrhosis  -Platelets currently 93, baseline prostate 70-80 -Continue monitor CBC  Acute kidney injury on chronic kidney disease, stage III -Creatinine trending downward, currently 1.98 ( baseline approximately 1.4-1.6) -Albumin was given on 10/09/2017 -Monitor BMP  Hyperkalemia -Likely secondary to spironolactone, potassium down to 4.6   -will continue to monitor closely -Continue lactulose and Lasix  Hypoalbuminemia -albumin 1.9 -As above, albumin given -Nutrition consulted and appreciated  Morbid obesity -BMI 46.64 -Nutrition consultation appreciated  DVT Prophylaxis  SCDs  Code Status: Full  Family Communication: None at bedside  Disposition Plan: Admitted. Possibly discharge to home in 24-48 hours pending GI recommendations.   Consultants Gastroenterology General surgery  Procedures  HIDA US guided therapeutic paracentesis  EGD  Antibiotics   Anti-infectives (From admission, onward)   Start     Dose/Rate Route Frequency Ordered Stop   10/09/17 1800  piperacillin-tazobactam (ZOSYN) IVPB 3.375 g     3.375 g 12.5 mL/hr over 240 Minutes Intravenous Every 8 hours 10/09/17 1629     10/08/17 1000  rifaximin (XIFAXAN) tablet 550 mg     550 mg Oral 2 times daily 10/08/17 0253        Subjective:   Elizabeth Mclean seen and examined today. No longer having abdominal pain, nausea or vomiting. Denies  chest pain, shortness of breath, dizziness or headache. Wishes her leg swelling would improve.  Objective:  Vitals:   10/11/17 0900 10/11/17 1300 10/11/17 2033 10/12/17 0356  BP:  (!) 133/57 (!) 146/54 117/60  Pulse:  86 84 82  Resp:  16 16 16   Temp:  98.9 F (37.2 C) 98.4 F (36.9 C) 98.2 F (36.8 C)  TempSrc:  Oral Oral Oral  SpO2:  100% 100% 95%  Weight: 134 kg (295 lb 6.7 oz)     Height:        Intake/Output Summary (Last 24 hours) at 10/12/2017 1038 Last data filed at 10/11/2017 1700 Gross per 24 hour  Intake 480 ml  Output -  Net 480 ml   Filed Weights   10/07/17 1459 10/08/17 1400 10/11/17 0900  Weight: 117.9 kg (260 lb) 135.1 kg (297 lb 13.5 oz) 134 kg (295 lb 6.7 oz)   Exam  General: Well developed, NAD  HEENT: NCAT, mucous membranes moist.   Cardiovascular: S1 S2 auscultated, no rubs, murmurs or gallops. Regular rate and rhythm.  Respiratory: Clear to auscultation bilaterally with equal chest rise  Abdomen: Soft, obese, nontender, nondistended, + bowel sounds  Extremities: warm dry without cyanosis clubbing. 2+Edema LE  Neuro: AAOx3, nonfocal  Psych: Appropriate and pleasant  Data Reviewed: I have personally reviewed following labs and imaging studies  CBC: Recent Labs  Lab 10/08/17 0943  10/09/17 0612 10/09/17 1328 10/10/17 0703 10/11/17 0419 10/12/17 0356  WBC 12.3*  --  10.9*  --  9.3 8.5 10.0  NEUTROABS 10.1*  --  8.6*  --   --   --   --   HGB 10.4*   < > 9.1* 9.7* 8.7* 8.8* 9.2*  HCT 29.0*   < > 25.0* 26.2* 23.9* 24.2* 25.4*  MCV 93.9  --  92.6  --  92.6 93.1 93.0  PLT 112*  --  108*  --  97* 93* 93*   < > = values in this interval not displayed.   Basic Metabolic Panel: Recent Labs  Lab 10/08/17 0943 10/08/17 1317 10/09/17 0612 10/10/17 0703 10/11/17 0419 10/12/17 0356  NA  --  133* 134* 132* 132* 130*  K  --  5.3* 5.5* 5.2* 4.6 4.6  CL  --  105 106 104 105 104  CO2  --  18* 20* 20* 20* 21*  GLUCOSE  --  86 102* 98 94 114*   BUN  --  41* 42* 40* 41* 36*  CREATININE  --  1.79* 2.03* 1.81* 1.69* 1.98*  CALCIUM  --  8.2* 8.1* 8.3* 8.5* 8.3*  MG 2.0  --  2.1  --   --   --   PHOS 3.9  --  4.4  --   --   --    GFR: Estimated Creatinine Clearance: 42.7 mL/min (A) (by C-G formula based on SCr of 1.98 mg/dL (H)). Liver Function Tests: Recent Labs  Lab 10/07/17 1512 10/08/17 1317 10/09/17 0612 10/10/17 0703  AST 166* 151* 129* 113*  ALT 79* 75* 64* 60*  ALKPHOS 120 104 83 78  BILITOT 4.6* 4.4* 4.5* 4.5*  PROT 7.0 6.7 5.9* 5.8*  ALBUMIN 2.0* 1.9* 1.7* 1.9*   Recent Labs  Lab 10/07/17 1512  LIPASE 55*   Recent Labs  Lab 10/08/17 1317  AMMONIA 20   Coagulation Profile: Recent Labs  Lab 10/10/17 0703  INR 2.16   Cardiac Enzymes: No results for input(s): CKTOTAL, CKMB, CKMBINDEX, TROPONINI in the last 168 hours. BNP (last 3 results) No results for input(s): PROBNP in the last 8760 hours. HbA1C: No results  for input(s): HGBA1C in the last 72 hours. CBG: Recent Labs  Lab 10/11/17 0557 10/11/17 1155 10/11/17 1616 10/11/17 2101 10/12/17 0652  GLUCAP 96 113* 143* 108* 95   Lipid Profile: No results for input(s): CHOL, HDL, LDLCALC, TRIG, CHOLHDL, LDLDIRECT in the last 72 hours. Thyroid Function Tests: No results for input(s): TSH, T4TOTAL, FREET4, T3FREE, THYROIDAB in the last 72 hours. Anemia Panel: No results for input(s): VITAMINB12, FOLATE, FERRITIN, TIBC, IRON, RETICCTPCT in the last 72 hours. Urine analysis:    Component Value Date/Time   COLORURINE AMBER (A) 10/08/2017 0700   APPEARANCEUR HAZY (A) 10/08/2017 0700   LABSPEC 1.034 (H) 10/08/2017 0700   PHURINE 5.0 10/08/2017 0700   GLUCOSEU NEGATIVE 10/08/2017 0700   HGBUR NEGATIVE 10/08/2017 0700   BILIRUBINUR NEGATIVE 10/08/2017 0700   KETONESUR NEGATIVE 10/08/2017 0700   PROTEINUR NEGATIVE 10/08/2017 0700   UROBILINOGEN 1.0 12/13/2013 1432   NITRITE NEGATIVE 10/08/2017 0700   LEUKOCYTESUR NEGATIVE 10/08/2017 0700   Sepsis  Labs: @LABRCNTIP (procalcitonin:4,lacticidven:4)  ) Recent Results (from the past 240 hour(s))  Culture, body fluid-bottle     Status: None (Preliminary result)   Collection Time: 10/08/17  1:20 PM  Result Value Ref Range Status   Specimen Description PERITONEAL  Final   Special Requests NONE  Final   Culture NO GROWTH 3 DAYS  Final   Report Status PENDING  Incomplete  Gram stain     Status: None   Collection Time: 10/08/17  1:20 PM  Result Value Ref Range Status   Specimen Description PERITONEAL  Final   Special Requests NONE  Final   Gram Stain   Final    FEW WBC PRESENT, PREDOMINANTLY MONONUCLEAR NO ORGANISMS SEEN    Report Status 10/08/2017 FINAL  Final      Radiology Studies: No results found.   Scheduled Meds: . cholecalciferol  2,000 Units Oral Daily  . darifenacin  7.5 mg Oral Daily  . ferrous sulfate  325 mg Oral Q breakfast  . furosemide  80 mg Oral Daily  . lactulose  30 g Oral TID  . rifaximin  550 mg Oral BID  . spironolactone  200 mg Oral Daily   Continuous Infusions: . piperacillin-tazobactam (ZOSYN)  IV 3.375 g (10/12/17 1829)     LOS: 3 days   Time Spent in minutes   30 minutes  Bentli Llorente D.O. on 10/12/2017 at 10:38 AM  Between 7am to 7pm - Pager - [phone]  After 7pm go to [email or web address] - password [email or web address]  And look for the night coverage person covering for me after hours  [facility] Office  [phone]

## 2017-10-13 ENCOUNTER — Encounter (HOSPITAL_COMMUNITY): Payer: Self-pay | Admitting: Gastroenterology

## 2017-10-13 LAB — BASIC METABOLIC PANEL
Anion gap: 7 (ref 5–15)
BUN: 35 mg/dL — AB (ref 6–20)
CO2: 21 mmol/L — ABNORMAL LOW (ref 22–32)
Calcium: 8 mg/dL — ABNORMAL LOW (ref 8.9–10.3)
Chloride: 102 mmol/L (ref 101–111)
Creatinine, Ser: 1.89 mg/dL — ABNORMAL HIGH (ref 0.44–1.00)
GFR, EST AFRICAN AMERICAN: 32 mL/min — AB (ref >60)
GFR, EST NON AFRICAN AMERICAN: 28 mL/min — AB (ref >60)
Glucose, Bld: 106 mg/dL — ABNORMAL HIGH (ref 65–99)
POTASSIUM: 4.4 mmol/L (ref 3.5–5.1)
SODIUM: 130 mmol/L — AB (ref 135–145)

## 2017-10-13 LAB — GLUCOSE, CAPILLARY
GLUCOSE-CAPILLARY: 97 mg/dL (ref 65–99)
GLUCOSE-CAPILLARY: 102 mg/dL — AB (ref 65–99)
GLUCOSE-CAPILLARY: 105 mg/dL — AB (ref 65–99)
Glucose-Capillary: 122 mg/dL — ABNORMAL HIGH (ref 65–99)

## 2017-10-13 LAB — CULTURE, BODY FLUID W GRAM STAIN -BOTTLE: Culture: NO GROWTH

## 2017-10-13 LAB — CULTURE, BODY FLUID-BOTTLE

## 2017-10-13 NOTE — Progress Notes (Addendum)
  GI Progress Note   covering for Drs Collene Mares & Benson Norway    Subjective  No abdominal pain.    Objective  Vital signs in last 24 hours: Temp:  [98.1 F (36.7 C)-98.4 F (36.9 C)] 98.4 F (36.9 C) (01/27 0457) Pulse Rate:  [83-86] 86 (01/27 0457) Resp:  [18] 18 (01/27 0457) BP: (108-134)/(34-43) 132/41 (01/27 0457) SpO2:  [95 %-100 %] 95 % (01/27 0457) Last BM Date: 10/12/17  General: Alert, well-developed, in NAD Heart:  Regular rate and rhythm; no murmurs Chest: Clear to ascultation bilaterally Abdomen:  Soft, nontender and moderately distended. Normal bowel sounds, without guarding, and without rebound.   Extremities:  Without edema. Neurologic:  Alert and  oriented x4; grossly normal neurologically. Psych:  Alert and cooperative. Normal mood and affect.  Intake/Output from previous day: 01/26 0701 - 01/27 0700 In: 100 [IV Piggyback:100] Out: -  Intake/Output this shift: No intake/output data recorded.  Lab Results: Recent Labs    10/11/17 0419 10/12/17 0356  WBC 8.5 10.0  HGB 8.8* 9.2*  HCT 24.2* 25.4*  PLT 93* 93*   BMET Recent Labs    10/11/17 0419 10/12/17 0356 10/13/17 1144  NA 132* 130* 130*  K 4.6 4.6 4.4  CL 105 104 102  CO2 20* 21* 21*  GLUCOSE 94 114* 106*  BUN 41* 36* 35*  CREATININE 1.69* 1.98* 1.89*  CALCIUM 8.5* 8.3* 8.0*      Assessment & Plan   1. Decompensated cirrhosis with ascites, anasarca. Cr=1.89, BUN=35 today. Weight: 297 on 1/22 and 295 on 1/25. Request daily weights. Currently taking Lasix 80 mg daily and Aldactone 200 mg daily.   2. RUQ pain, resolved. Suspected cholecystitis. Continue IV Zosyn.   3. Heme positive stool.   Drs. Mann & Benson Norway to resume GI care on Monday.    LOS: 4 days   Norberto Sorenson T. Fuller Plan MD 10/13/2017, 12:37 PM

## 2017-10-13 NOTE — Progress Notes (Signed)
PROGRESS NOTE    Elizabeth Mclean  JSE:[id number] DOB: Aug 13, 1956 DOA: 10/07/2017 PCP: Kerin Perna, NP   Chief Complaint  Patient presents with  . Abdominal Pain    Brief Narrative:  HPI on 10/08/2017 by Dr. Crist Fat is a 62 y.o. female with medical history significant of HCV, cirrhosis, HTN.  Patient presents to the ED with c/o epigastric / RUQ pain.  Symptoms onset around 1 week ago.  Associated vomiting and diarrhea.  Worse with eating.  9/10, non-radiating.  Dark colored vomit and stools.  Also reports peripheral edema, abdominal distention, and some SOB with exertion.  Interim history Patient admitted for abdominal pain. Found to have positive FOBT. Also had HIDA scan showing acute cholecystitis. Gastroenterology consultation appreciated. Patient also had paracentesis with 2 L removed. S/p EGD.  Assessment & Plan   Right upper quadrant/epigastric abdominal pain/Possible acute cholecystitis  -Gastroenterology consultation appreciated -Right upper quadrant ultrasound showing thickened gallbladder, positive Murphy sign -CT abdomen and pelvis showed possible duodenitis -HIDA scan: Nonvisualization of the gallbladder despite morphine augmentation consistent with acute cholecystitis -Discussed with Dr. Benson Norway. Recommended surgical consultation. -General surgery consulted and appreciated. Recommended starting IV Zosyn and possibly drain placement by interventional radiology if pain does not improve. Will continue to monitor. -Abdominal pain continues to improve. General surgery signed off  Decompensated Cirrhosis with anasarca -CT abdomen and pelvis showed cirrhotic morphology of the liver with moderate to large volume ascites in the abdomen and pelvis. -Interventional radiology consultation appreciated, status post paracentesis showing 2 L -Continue lasix (hold spironolactone due to hyperkalemia) -Continue lactulose and rifaximin -Monitor daily weights,  intake and output -Fluid gram stain showed no organisms seen; Fluid culture shows no growth to date -Noted to have small esophageal varices with portal hypertensive gastropathy on EGD. Clips (see MRI unsafe at) were placed  Occult blood positive stool -hemoglobin currently 9.2, appears to be stable. Baseline hemoglobin 9-10 -GI consulted, EGD showed small esophageal varices. Portal hypertensive gastropathy. Biopsied. Clips were placed. Gastric antral vascular ectasia without bleeding. Congested duodenal mucosa. -Continue to monitor CBC  Thrombocytopenia -likely secondary to cirrhosis  -Platelets currently 93, baseline prostate 70-80 -Continue monitor CBC  Acute kidney injury on chronic kidney disease, stage III -pending labs this morning ( baseline approximately 1.4-1.6) -Albumin was given on 10/09/2017 -Continue to Monitor BMP  Hyperkalemia -Likely secondary to spironolactone, potassium down to 4.6   -will continue to monitor closely- pending labs today -Continue lactulose and Lasix  Hypoalbuminemia -albumin 1.9 -As above, albumin given -Nutrition consulted and appreciated  Morbid obesity -BMI 46.64 -Nutrition consultation appreciated  DVT Prophylaxis  SCDs  Code Status: Full  Family Communication: None at bedside  Disposition Plan: Admitted. Possibly discharge to home in 24-48 hours pending GI recommendations. Biopsy results pending   Consultants Gastroenterology General surgery  Procedures  HIDA US guided therapeutic paracentesis  EGD  Antibiotics   Anti-infectives (From admission, onward)   Start     Dose/Rate Route Frequency Ordered Stop   10/09/17 1800  piperacillin-tazobactam (ZOSYN) IVPB 3.375 g     3.375 g 12.5 mL/hr over 240 Minutes Intravenous Every 8 hours 10/09/17 1629     10/08/17 1000  rifaximin (XIFAXAN) tablet 550 mg     550 mg Oral 2 times daily 10/08/17 0253        Subjective:   Elizabeth Mclean seen and examined today. Feels abdominal  pain has improved. Denies hest pain, shortness of breath, dizziness or headache.   Objective:  Vitals:   10/12/17 0356 10/12/17 1457 10/12/17 1935 10/13/17 0457  BP: 117/60 (!) 134/43 (!) 108/34 (!) 132/41  Pulse: 82 83 84 86  Resp: 16 18 18 18   Temp: 98.2 F (36.8 C) 98.3 F (36.8 C) 98.1 F (36.7 C) 98.4 F (36.9 C)  TempSrc: Oral Oral Oral Oral  SpO2: 95% 100% 100% 95%  Weight:      Height:        Intake/Output Summary (Last 24 hours) at 10/13/2017 1037 Last data filed at 10/12/2017 1800 Gross per 24 hour  Intake 100 ml  Output -  Net 100 ml   Filed Weights   10/07/17 1459 10/08/17 1400 10/11/17 0900  Weight: 117.9 kg (260 lb) 135.1 kg (297 lb 13.5 oz) 134 kg (295 lb 6.7 oz)   Exam  General: Well developed, well nourished, NAD  HEENT: NCAT, mucous membranes moist.   Cardiovascular: S1 S2 auscultated, RRR, no murmus   Respiratory: Clear to auscultation bilaterally with equal chest rise  Abdomen: Soft, obese, nontender, nondistended, + bowel sounds  Extremities: warm dry without cyanosis clubbing. 2+Edema LE  Neuro: AAOx3, nonfocal  Psych: Appropriate mood and affect, and pleasant  Data Reviewed: I have personally reviewed following labs and imaging studies  CBC: Recent Labs  Lab 10/08/17 0943  10/09/17 0612 10/09/17 1328 10/10/17 0703 10/11/17 0419 10/12/17 0356  WBC 12.3*  --  10.9*  --  9.3 8.5 10.0  NEUTROABS 10.1*  --  8.6*  --   --   --   --   HGB 10.4*   < > 9.1* 9.7* 8.7* 8.8* 9.2*  HCT 29.0*   < > 25.0* 26.2* 23.9* 24.2* 25.4*  MCV 93.9  --  92.6  --  92.6 93.1 93.0  PLT 112*  --  108*  --  97* 93* 93*   < > = values in this interval not displayed.   Basic Metabolic Panel: Recent Labs  Lab 10/08/17 0943 10/08/17 1317 10/09/17 0612 10/10/17 0703 10/11/17 0419 10/12/17 0356  NA  --  133* 134* 132* 132* 130*  K  --  5.3* 5.5* 5.2* 4.6 4.6  CL  --  105 106 104 105 104  CO2  --  18* 20* 20* 20* 21*  GLUCOSE  --  86 102* 98 94 114*    BUN  --  41* 42* 40* 41* 36*  CREATININE  --  1.79* 2.03* 1.81* 1.69* 1.98*  CALCIUM  --  8.2* 8.1* 8.3* 8.5* 8.3*  MG 2.0  --  2.1  --   --   --   PHOS 3.9  --  4.4  --   --   --    GFR: Estimated Creatinine Clearance: 42.7 mL/min (A) (by C-G formula based on SCr of 1.98 mg/dL (H)). Liver Function Tests: Recent Labs  Lab 10/07/17 1512 10/08/17 1317 10/09/17 0612 10/10/17 0703  AST 166* 151* 129* 113*  ALT 79* 75* 64* 60*  ALKPHOS 120 104 83 78  BILITOT 4.6* 4.4* 4.5* 4.5*  PROT 7.0 6.7 5.9* 5.8*  ALBUMIN 2.0* 1.9* 1.7* 1.9*   Recent Labs  Lab 10/07/17 1512  LIPASE 55*   Recent Labs  Lab 10/08/17 1317  AMMONIA 20   Coagulation Profile: Recent Labs  Lab 10/10/17 0703  INR 2.16   Cardiac Enzymes: No results for input(s): CKTOTAL, CKMB, CKMBINDEX, TROPONINI in the last 168 hours. BNP (last 3 results) No results for input(s): PROBNP in the last 8760 hours. HbA1C: No results  for input(s): HGBA1C in the last 72 hours. CBG: Recent Labs  Lab 10/12/17 0652 10/12/17 1136 10/12/17 1641 10/12/17 1935 10/13/17 0657  GLUCAP 95 131* 104* 115* 97   Lipid Profile: No results for input(s): CHOL, HDL, LDLCALC, TRIG, CHOLHDL, LDLDIRECT in the last 72 hours. Thyroid Function Tests: No results for input(s): TSH, T4TOTAL, FREET4, T3FREE, THYROIDAB in the last 72 hours. Anemia Panel: No results for input(s): VITAMINB12, FOLATE, FERRITIN, TIBC, IRON, RETICCTPCT in the last 72 hours. Urine analysis:    Component Value Date/Time   COLORURINE AMBER (A) 10/08/2017 0700   APPEARANCEUR HAZY (A) 10/08/2017 0700   LABSPEC 1.034 (H) 10/08/2017 0700   PHURINE 5.0 10/08/2017 0700   GLUCOSEU NEGATIVE 10/08/2017 0700   HGBUR NEGATIVE 10/08/2017 0700   BILIRUBINUR NEGATIVE 10/08/2017 0700   KETONESUR NEGATIVE 10/08/2017 0700   PROTEINUR NEGATIVE 10/08/2017 0700   UROBILINOGEN 1.0 12/13/2013 1432   NITRITE NEGATIVE 10/08/2017 0700   LEUKOCYTESUR NEGATIVE 10/08/2017 0700   Sepsis  Labs: @LABRCNTIP (procalcitonin:4,lacticidven:4)  ) Recent Results (from the past 240 hour(s))  Culture, body fluid-bottle     Status: None (Preliminary result)   Collection Time: 10/08/17  1:20 PM  Result Value Ref Range Status   Specimen Description PERITONEAL  Final   Special Requests NONE  Final   Culture NO GROWTH 4 DAYS  Final   Report Status PENDING  Incomplete  Gram stain     Status: None   Collection Time: 10/08/17  1:20 PM  Result Value Ref Range Status   Specimen Description PERITONEAL  Final   Special Requests NONE  Final   Gram Stain   Final    FEW WBC PRESENT, PREDOMINANTLY MONONUCLEAR NO ORGANISMS SEEN    Report Status 10/08/2017 FINAL  Final      Radiology Studies: No results found.   Scheduled Meds: . cholecalciferol  2,000 Units Oral Daily  . darifenacin  7.5 mg Oral Daily  . ferrous sulfate  325 mg Oral Q breakfast  . furosemide  80 mg Oral Daily  . lactulose  30 g Oral TID  . rifaximin  550 mg Oral BID  . spironolactone  200 mg Oral Daily   Continuous Infusions: . piperacillin-tazobactam (ZOSYN)  IV 3.375 g (10/13/17 0155)     LOS: 4 days   Time Spent in minutes   30 minutes  Tristen Luce D.O. on 10/13/2017 at 10:37 AM  Between 7am to 7pm - Pager - [phone]  After 7pm go to [email or web address] - password [email or web address]  And look for the night coverage person covering for me after hours  [facility] Office  [phone]

## 2017-10-13 NOTE — Plan of Care (Signed)
  Nutrition: Adequate nutrition will be maintained 10/13/2017 1947 - Progressing by Irish Lack, RN   Clinical Measurements: Ability to maintain clinical measurements within normal limits will improve 10/13/2017 1947 - Progressing by Irish Lack, RN   Health Behavior/Discharge Planning: Ability to manage health-related needs will improve 10/13/2017 1947 - Progressing by Irish Lack, RN

## 2017-10-14 LAB — BASIC METABOLIC PANEL
ANION GAP: 7 (ref 5–15)
BUN: 33 mg/dL — ABNORMAL HIGH (ref 6–20)
CALCIUM: 7.9 mg/dL — AB (ref 8.9–10.3)
CO2: 19 mmol/L — ABNORMAL LOW (ref 22–32)
Chloride: 103 mmol/L (ref 101–111)
Creatinine, Ser: 1.81 mg/dL — ABNORMAL HIGH (ref 0.44–1.00)
GFR calc non Af Amer: 29 mL/min — ABNORMAL LOW (ref >60)
GFR, EST AFRICAN AMERICAN: 34 mL/min — AB (ref >60)
GLUCOSE: 132 mg/dL — AB (ref 65–99)
Potassium: 4.1 mmol/L (ref 3.5–5.1)
Sodium: 129 mmol/L — ABNORMAL LOW (ref 135–145)

## 2017-10-14 LAB — GLUCOSE, CAPILLARY
GLUCOSE-CAPILLARY: 104 mg/dL — AB (ref 65–99)
Glucose-Capillary: 88 mg/dL (ref 65–99)
Glucose-Capillary: 109 mg/dL — ABNORMAL HIGH (ref 65–99)
Glucose-Capillary: 107 mg/dL — ABNORMAL HIGH (ref 65–99)

## 2017-10-14 NOTE — Progress Notes (Signed)
Subjective: Feeling well.  No complaints.  Objective: Vital signs in last 24 hours: Temp:  [97.9 F (36.6 C)-98.7 F (37.1 C)] 98.7 F (37.1 C) (01/28 0700) Pulse Rate:  [82-87] 87 (01/28 0700) Resp:  [16-18] 18 (01/28 0700) BP: (116-136)/(49-59) 116/49 (01/28 0700) SpO2:  [98 %-99 %] 98 % (01/28 0700) Weight:  [132.4 kg (291 lb 14.2 oz)] 132.4 kg (291 lb 14.2 oz) (01/27 1500) Last BM Date: 10/14/17  Intake/Output from previous day: 01/27 0701 - 01/28 0700 In: 460 [P.O.:360; IV Piggyback:100] Out: -  Intake/Output this shift: No intake/output data recorded.  General appearance: alert and no distress GI: soft, non-tender; bowel sounds normal; no masses,  no organomegaly and + ascites Extremities: 4+ pitting edema   Lab Results: Recent Labs    10/12/17 0356  WBC 10.0  HGB 9.2*  HCT 25.4*  PLT 93*   BMET Recent Labs    10/12/17 0356 10/13/17 1144 10/14/17 0843  NA 130* 130* 129*  K 4.6 4.4 4.1  CL 104 102 103  CO2 21* 21* 19*  GLUCOSE 114* 106* 132*  BUN 36* 35* 33*  CREATININE 1.98* 1.89* 1.81*  CALCIUM 8.3* 8.0* 7.9*   LFT No results for input(s): PROT, ALBUMIN, AST, ALT, ALKPHOS, BILITOT, BILIDIR, IBILI in the last 72 hours. PT/INR No results for input(s): LABPROT, INR in the last 72 hours. Hepatitis Panel No results for input(s): HEPBSAG, HCVAB, HEPAIGM, HEPBIGM in the last 72 hours. C-Diff No results for input(s): CDIFFTOX in the last 72 hours. Fecal Lactopherrin No results for input(s): FECLLACTOFRN in the last 72 hours.  Studies/Results: No results found.  Medications:  Scheduled: . cholecalciferol  2,000 Units Oral Daily  . darifenacin  7.5 mg Oral Daily  . ferrous sulfate  325 mg Oral Q breakfast  . furosemide  80 mg Oral Daily  . lactulose  30 g Oral TID  . rifaximin  550 mg Oral BID  . spironolactone  200 mg Oral Daily   Continuous: . piperacillin-tazobactam (ZOSYN)  IV Stopped (10/14/17 1326)    Assessment/Plan: 1) Decompensated  cirrhosis. 2) Ascites. 3) LE edema. 4) ? Cholecystitis.   Her current weight is at 127 kilograms.  She does not have any further abdominal pain.  The patient continues to diurese well on Step II diuretics.  Her creatinine is at 1.8, which is the threshold for spironolactone effectiveness.  Her LE edema is improving as there are wrinkles in her skin.    Plan: 1) Continue with Step II diuretics. 2) Follow BMP. 3) Change from Zosyn to Augmentin upon discharge and treated for a total of 10 days. 4) Okay to D/C home from the GI standpoint tomorrow AM. 5) Follow up in my office in one week.  LOS: 5 days   Seema Blum D 10/14/2017, 1:47 PM

## 2017-10-14 NOTE — Progress Notes (Signed)
PROGRESS NOTE    Elizabeth Mclean  VEL:[id number] DOB: [birth year] DOA: 10/07/2017 PCP: Kerin Perna, NP   Chief Complaint  Patient presents with  . Abdominal Pain    Brief Narrative:  HPI on 10/08/2017 by Dr. Crist Fat is a 62 y.o. female with medical history significant of HCV, cirrhosis, HTN.  Patient presents to the ED with c/o epigastric / RUQ pain.  Symptoms onset around 1 week ago.  Associated vomiting and diarrhea.  Worse with eating.  9/10, non-radiating.  Dark colored vomit and stools.  Also reports peripheral edema, abdominal distention, and some SOB with exertion.  Interim history Patient admitted for abdominal pain. Found to have positive FOBT. Also had HIDA scan showing acute cholecystitis. Gastroenterology consultation appreciated. Patient also had paracentesis with 2 L removed. S/p EGD.  Assessment & Plan   Right upper quadrant/epigastric abdominal pain/Possible acute cholecystitis  -Gastroenterology consultation appreciated -Right upper quadrant ultrasound showing thickened gallbladder, positive Murphy sign -CT abdomen and pelvis showed possible duodenitis -HIDA scan: Nonvisualization of the gallbladder despite morphine augmentation consistent with acute cholecystitis -Discussed with Dr. Benson Norway. Recommended surgical consultation. -General surgery consulted and appreciated. Recommended starting IV Zosyn and possibly drain placement by interventional radiology if pain does not improve. Will continue to monitor. -Abdominal pain continues to improve. General surgery signed off -pending further recommendations from gastroenterology  Decompensated Cirrhosis with anasarca -CT abdomen and pelvis showed cirrhotic morphology of the liver with moderate to large volume ascites in the abdomen and pelvis. -Interventional radiology consultation appreciated, status post paracentesis showing 2 L -Continue lasix, spironolactone -Continue lactulose and  rifaximin -Monitor daily weights, intake and output -Fluid gram stain showed no organisms seen; Fluid culture shows no growth to date -Noted to have small esophageal varices with portal hypertensive gastropathy on EGD. Clips (MRI unsafe) were placed  Occult blood positive stool -hemoglobin currently 9.2, appears to be stable. Baseline hemoglobin 9-10 -GI consulted, EGD showed small esophageal varices. Portal hypertensive gastropathy. Biopsied. Clips were placed. Gastric antral vascular ectasia without bleeding. Congested duodenal mucosa. -Continue to monitor CBC  Thrombocytopenia -likely secondary to cirrhosis  -Platelets currently 93, baseline prostate 70-80 -Will order CBC for AM  Acute kidney injury on chronic kidney disease, stage III -Creatinine currently 1.81 ( baseline approximately 1.4-1.6) -Albumin was given on 10/09/2017 -Continue to Monitor BMP  Hyperkalemia -Likely secondary to spironolactone, potassium down to 4.1 -Continue lactulose and Lasix -Continue to monitor BMP  Hypoalbuminemia -albumin 1.9 -As above, albumin given -Nutrition consulted and appreciated  Morbid obesity -BMI 46.64 -Nutrition consultation appreciated  DVT Prophylaxis  SCDs  Code Status: Full  Family Communication: None at bedside  Disposition Plan: Admitted. Possibly discharge to home in 24-48 hours pending GI recommendations. Biopsy results pending   Consultants Gastroenterology General surgery  Procedures  HIDA US guided therapeutic paracentesis  EGD  Antibiotics   Anti-infectives (From admission, onward)   Start     Dose/Rate Route Frequency Ordered Stop   10/09/17 1800  piperacillin-tazobactam (ZOSYN) IVPB 3.375 g     3.375 g 12.5 mL/hr over 240 Minutes Intravenous Every 8 hours 10/09/17 1629     10/08/17 1000  rifaximin (XIFAXAN) tablet 550 mg     550 mg Oral 2 times daily 10/08/17 0253        Subjective:   Elizabeth Mclean seen and examined today. Denies further  abdominal pain, nausea, vomiting. Denies chest pain, shortness of breath.   Objective:   Vitals:   10/13/17 1419 10/13/17 1500  10/13/17 2235 10/14/17 0700  BP: (!) 123/59  (!) 136/53 (!) 116/49  Pulse: 85  82 87  Resp: 18  16 18   Temp: 97.9 F (36.6 C)  98.5 F (36.9 C) 98.7 F (37.1 C)  TempSrc: Oral  Oral Oral  SpO2: 99%  98% 98%  Weight:  132.4 kg (291 lb 14.2 oz)    Height:        Intake/Output Summary (Last 24 hours) at 10/14/2017 1048 Last data filed at 10/14/2017 0606 Gross per 24 hour  Intake 460 ml  Output -  Net 460 ml   Filed Weights   10/08/17 1400 10/11/17 0900 10/13/17 1500  Weight: 135.1 kg (297 lb 13.5 oz) 134 kg (295 lb 6.7 oz) 132.4 kg (291 lb 14.2 oz)   Exam  General: Well developed, well nourished, NAD  HEENT: NCAT, mucous membranes moist.   Cardiovascular: S1 S2 auscultated, RRR, no murmus   Respiratory: Clear to auscultation bilaterally with equal chest rise, no wheezing  Abdomen: Soft, obese, nontender, nondistended, + bowel sounds  Extremities: warm dry without cyanosis clubbing. 2+Edema LE  Neuro: AAOx3, nonfocal  Psych: Appropriate mood and affect, and pleasant  Data Reviewed: I have personally reviewed following labs and imaging studies  CBC: Recent Labs  Lab 10/08/17 0943  10/09/17 0612 10/09/17 1328 10/10/17 0703 10/11/17 0419 10/12/17 0356  WBC 12.3*  --  10.9*  --  9.3 8.5 10.0  NEUTROABS 10.1*  --  8.6*  --   --   --   --   HGB 10.4*   < > 9.1* 9.7* 8.7* 8.8* 9.2*  HCT 29.0*   < > 25.0* 26.2* 23.9* 24.2* 25.4*  MCV 93.9  --  92.6  --  92.6 93.1 93.0  PLT 112*  --  108*  --  97* 93* 93*   < > = values in this interval not displayed.   Basic Metabolic Panel: Recent Labs  Lab 10/08/17 0943  10/09/17 0612 10/10/17 0703 10/11/17 0419 10/12/17 0356 10/13/17 1144 10/14/17 0843  NA  --    < > 134* 132* 132* 130* 130* 129*  K  --    < > 5.5* 5.2* 4.6 4.6 4.4 4.1  CL  --    < > 106 104 105 104 102 103  CO2  --    < >  20* 20* 20* 21* 21* 19*  GLUCOSE  --    < > 102* 98 94 114* 106* 132*  BUN  --    < > 42* 40* 41* 36* 35* 33*  CREATININE  --    < > 2.03* 1.81* 1.69* 1.98* 1.89* 1.81*  CALCIUM  --    < > 8.1* 8.3* 8.5* 8.3* 8.0* 7.9*  MG 2.0  --  2.1  --   --   --   --   --   PHOS 3.9  --  4.4  --   --   --   --   --    < > = values in this interval not displayed.   GFR: Estimated Creatinine Clearance: 46.3 mL/min (A) (by C-G formula based on SCr of 1.81 mg/dL (H)). Liver Function Tests: Recent Labs  Lab 10/07/17 1512 10/08/17 1317 10/09/17 0612 10/10/17 0703  AST 166* 151* 129* 113*  ALT 79* 75* 64* 60*  ALKPHOS 120 104 83 78  BILITOT 4.6* 4.4* 4.5* 4.5*  PROT 7.0 6.7 5.9* 5.8*  ALBUMIN 2.0* 1.9* 1.7* 1.9*   Recent Labs  Lab 10/07/17 1512  LIPASE 55*   Recent Labs  Lab 10/08/17 1317  AMMONIA 20   Coagulation Profile: Recent Labs  Lab 10/10/17 0703  INR 2.16   Cardiac Enzymes: No results for input(s): CKTOTAL, CKMB, CKMBINDEX, TROPONINI in the last 168 hours. BNP (last 3 results) No results for input(s): PROBNP in the last 8760 hours. HbA1C: No results for input(s): HGBA1C in the last 72 hours. CBG: Recent Labs  Lab 10/13/17 0657 10/13/17 1154 10/13/17 1643 10/13/17 2233 10/14/17 0726  GLUCAP 97 102* 105* 122* 88   Lipid Profile: No results for input(s): CHOL, HDL, LDLCALC, TRIG, CHOLHDL, LDLDIRECT in the last 72 hours. Thyroid Function Tests: No results for input(s): TSH, T4TOTAL, FREET4, T3FREE, THYROIDAB in the last 72 hours. Anemia Panel: No results for input(s): VITAMINB12, FOLATE, FERRITIN, TIBC, IRON, RETICCTPCT in the last 72 hours. Urine analysis:    Component Value Date/Time   COLORURINE AMBER (A) 10/08/2017 0700   APPEARANCEUR HAZY (A) 10/08/2017 0700   LABSPEC 1.034 (H) 10/08/2017 0700   PHURINE 5.0 10/08/2017 0700   GLUCOSEU NEGATIVE 10/08/2017 0700   HGBUR NEGATIVE 10/08/2017 0700   BILIRUBINUR NEGATIVE 10/08/2017 0700   KETONESUR NEGATIVE  10/08/2017 0700   PROTEINUR NEGATIVE 10/08/2017 0700   UROBILINOGEN 1.0 12/13/2013 1432   NITRITE NEGATIVE 10/08/2017 0700   LEUKOCYTESUR NEGATIVE 10/08/2017 0700   Sepsis Labs: @LABRCNTIP (procalcitonin:4,lacticidven:4)  ) Recent Results (from the past 240 hour(s))  Culture, body fluid-bottle     Status: None   Collection Time: 10/08/17  1:20 PM  Result Value Ref Range Status   Specimen Description PERITONEAL  Final   Special Requests NONE  Final   Culture NO GROWTH 5 DAYS  Final   Report Status 10/13/2017 FINAL  Final  Gram stain     Status: None   Collection Time: 10/08/17  1:20 PM  Result Value Ref Range Status   Specimen Description PERITONEAL  Final   Special Requests NONE  Final   Gram Stain   Final    FEW WBC PRESENT, PREDOMINANTLY MONONUCLEAR NO ORGANISMS SEEN    Report Status 10/08/2017 FINAL  Final      Radiology Studies: No results found.   Scheduled Meds: . cholecalciferol  2,000 Units Oral Daily  . darifenacin  7.5 mg Oral Daily  . ferrous sulfate  325 mg Oral Q breakfast  . furosemide  80 mg Oral Daily  . lactulose  30 g Oral TID  . rifaximin  550 mg Oral BID  . spironolactone  200 mg Oral Daily   Continuous Infusions: . piperacillin-tazobactam (ZOSYN)  IV 3.375 g (10/14/17 0924)     LOS: 5 days   Time Spent in minutes   30 minutes  Elizabeth Mclean D.O. on 10/14/2017 at 10:48 AM  Between 7am to 7pm - Pager - [phone]  After 7pm go to [email or web address] - password [email or web address]  And look for the night coverage person covering for me after hours  [facility] Office  [phone]

## 2017-10-15 ENCOUNTER — Other Ambulatory Visit: Payer: Self-pay

## 2017-10-15 ENCOUNTER — Encounter (HOSPITAL_COMMUNITY): Payer: Self-pay | Admitting: General Practice

## 2017-10-15 LAB — GLUCOSE, CAPILLARY: Glucose-Capillary: 101 mg/dL — ABNORMAL HIGH (ref 65–99)

## 2017-10-15 LAB — BASIC METABOLIC PANEL
Anion gap: 7 (ref 5–15)
BUN: 34 mg/dL — AB (ref 6–20)
CHLORIDE: 104 mmol/L (ref 101–111)
CO2: 21 mmol/L — AB (ref 22–32)
CREATININE: 1.81 mg/dL — AB (ref 0.44–1.00)
Calcium: 8 mg/dL — ABNORMAL LOW (ref 8.9–10.3)
GFR calc Af Amer: 34 mL/min — ABNORMAL LOW (ref >60)
GFR calc non Af Amer: 29 mL/min — ABNORMAL LOW (ref >60)
Glucose, Bld: 99 mg/dL (ref 65–99)
POTASSIUM: 4.2 mmol/L (ref 3.5–5.1)
SODIUM: 132 mmol/L — AB (ref 135–145)

## 2017-10-15 LAB — CBC
HEMATOCRIT: 24.8 % — AB (ref 36.0–46.0)
HEMOGLOBIN: 9.1 g/dL — AB (ref 12.0–15.0)
MCH: 34.2 pg — AB (ref 26.0–34.0)
MCHC: 36.7 g/dL — AB (ref 30.0–36.0)
MCV: 93.2 fL (ref 78.0–100.0)
Platelets: 73 10*3/uL — ABNORMAL LOW (ref 150–400)
RBC: 2.66 MIL/uL — AB (ref 3.87–5.11)
RDW: 17.7 % — ABNORMAL HIGH (ref 11.5–15.5)
WBC: 9.1 10*3/uL (ref 4.0–10.5)

## 2017-10-15 MED ORDER — TRAMADOL HCL 50 MG PO TABS: 50.0000 mg | ORAL_TABLET | Freq: Four times a day (QID) | ORAL | 0 refills | Status: DC | PRN

## 2017-10-15 MED ORDER — SPIRONOLACTONE 100 MG PO TABS: 200.0000 mg | ORAL_TABLET | Freq: Every day | ORAL | 0 refills | Status: DC

## 2017-10-15 MED ORDER — FUROSEMIDE 80 MG PO TABS: 80.0000 mg | ORAL_TABLET | Freq: Every day | ORAL | 0 refills | Status: DC

## 2017-10-15 MED ORDER — AMOXICILLIN-POT CLAVULANATE 875-125 MG PO TABS: 1.0000 | ORAL_TABLET | Freq: Two times a day (BID) | ORAL | 0 refills | Status: AC

## 2017-10-15 NOTE — Plan of Care (Signed)
  Health Behavior/Discharge Planning: Ability to manage health-related needs will improve 10/15/2017 1454 - Adequate for Discharge by Governor Rooks, RN   Clinical Measurements: Diagnostic test results will improve 10/15/2017 1454 - Adequate for Discharge by Governor Rooks, RN

## 2017-10-15 NOTE — Progress Notes (Signed)
Pt given discharge instructions and gone over with her. Answered all questions to satisfaction. Pt in no distress at time of discharge. Waiting on ride to get here to take her home.

## 2017-10-15 NOTE — Discharge Summary (Signed)
Physician Discharge Summary  Elizabeth Mclean EXH:[id number] DOB: [birth year] DOA: 10/07/2017  PCP: Elizabeth Perna, NP  Admit date: 10/07/2017 Discharge date: 10/15/2017  Time spent: 45 minutes  Recommendations for Outpatient Follow-up:  Patient will be discharged to home.  Patient will need to follow up with primary care provider within one week of discharge, repeat BMP in one week.  Follow up with Dr. Benson Mclean, gastroenterology, in one week. Patient should continue medications as prescribed.  Patient should follow a 2 g sodium diet.   Discharge Diagnoses:  Right upper quadrant/epigastric abdominal pain/Possible acute cholecystitis  Decompensated Cirrhosis with anasarca Occult blood positive stool Thrombocytopenia Acute kidney injury on chronic kidney disease, stage III Hyperkalemia Hypoalbuminemia Morbid obesity  Discharge Condition: Stable  Diet recommendation: Sodium restricted  Filed Weights   10/08/17 1400 10/11/17 0900 10/13/17 1500  Weight: 135.1 kg (297 lb 13.5 oz) 134 kg (295 lb 6.7 oz) 132.4 kg (291 lb 14.2 oz)    History of present illness:  on 10/08/2017 by Dr. Ian Mclean [name] a 62 y.o.femalewith medical history significant ofHCV, cirrhosis, HTN. Patient presents to the ED with c/o epigastric / RUQ pain. Symptoms onset around 1 week ago. Associated vomiting and diarrhea. Worse with eating. 9/10, non-radiating. Dark colored vomit and stools. Also reports peripheral edema, abdominal distention, and some SOB with exertion.  Hospital Course:  Right upper quadrant/epigastric abdominal pain/Possible acute cholecystitis  -Gastroenterology consultation appreciated -Abdominal pain has improved -Right upper quadrant ultrasound showing thickened gallbladder, positive Murphy sign -CT abdomen and pelvis showed possible duodenitis -HIDA scan: Nonvisualization of the gallbladder despite morphine augmentation consistent with acute  cholecystitis -Discussed with Dr. Benson Mclean. Recommended surgical consultation. -General surgery consulted and appreciated. Recommended starting IV Zosyn and possibly drain placement by interventional radiology if pain does not improve. -Abdominal pain continues to improve. General surgery signed off -Gastroenterology recommended one week follow up and continuing antibiotics for 10days total along with diuretics.  -Will discharge patient with Augmentin -will discharge patient with tramadol for pain control  Decompensated Cirrhosis with anasarca -CT abdomen and pelvis showed cirrhotic morphology of the liver with moderate to large volume ascites in the abdomen and pelvis. -Interventional radiology consultation appreciated, status post paracentesis showing 2 L -Continue lasix, spironolactone -Continue lactulose and rifaximin -Monitor daily weights, intake and output -Fluid gram stain showed no organisms seen; Fluid culture shows no growth to date -Noted to have small esophageal varices with portal hypertensive gastropathy on EGD. Clips (MRI unsafe) were placed  Occult blood positive stool/chronic anemia -hemoglobin currently 9.1, appears to be stable. Baseline hemoglobin 9-10 -GI consulted, EGD showed small esophageal varices. Portal hypertensive gastropathy. Biopsied. Clips were placed. Gastric antral vascular ectasia without bleeding. Congested duodenal mucosa. -Continue iron supplementation  Thrombocytopenia -likely secondary to cirrhosis  -Platelets currently 73, baseline prostate 70-80  Acute kidney injury on chronic kidney disease, stage III -Creatinine currently 1.81 ( baseline approximately 1.4-1.6) -Albumin was given on 10/09/2017  Hyperkalemia -Resolved -Likely secondary to spironolactone -Continue lactulose and Lasix -Repeat BMP in 1 week  Hypoalbuminemia -albumin 1.9 -As above, albumin given -Nutrition consulted and appreciated  Morbid obesity -BMI  46.64 -Nutrition consultation appreciated  Consultants Gastroenterology General surgery  Procedures  HIDA US guided therapeutic paracentesis  EGD  Discharge Exam: Vitals:   10/14/17 2011 10/15/17 0558  BP: (!) 131/55 (!) 139/52  Pulse: 81 83  Resp: 17 18  Temp: 98.6 F (37 C) 98.3 F (36.8 C)  SpO2: 100% 100%   Patient feels abdominal pain  has improved.  Denies current nausea vomiting, chest pain, shortness of breath, dizziness or headache.   General: Well developed, well nourished, NAD, appears stated age  62: NCAT, mucous membranes moist.  Cardiovascular: S1 S2 auscultated, RRR, no murmur  Respiratory: Clear to auscultation bilaterally with equal chest rise  Abdomen: Soft, obese, nontender, nondistended, + bowel sounds  Extremities: warm dry without cyanosis clubbing.  2+ lower extremity edema  Neuro: AAOx3, nonfocal  Psych: Normal affect and demeanor with intact judgement and insight  Discharge Instructions Discharge Instructions    Discharge instructions   Complete by:  As directed    Patient will be discharged to home.  Patient will need to follow up with primary care provider within one week of discharge, repeat BMP in one week.  Follow up with Dr. Benson Mclean, gastroenterology, in one week. Patient should continue medications as prescribed.  Patient should follow a 2 g sodium diet.     Allergies as of 10/15/2017      Reactions   Aspirin Other (See Comments)   Stomach aches    Fexofenadine Other (See Comments)   Causes leg and back pain   Hydrocodone Itching      Medication List    STOP taking these medications   aspirin-sod bicarb-citric acid 325 MG Tbef tablet Commonly known as:  ALKA-SELTZER   potassium chloride SA 20 MEQ tablet Commonly known as:  K-DUR,KLOR-CON     TAKE these medications   amoxicillin-clavulanate 875-125 MG tablet Commonly known as:  AUGMENTIN Take 1 tablet by mouth 2 (two) times daily for 5 days.   BEANO Tabs Take  1-2 tablets by mouth daily as needed (to prevent gas with certain foods).   ferrous sulfate 325 (65 FE) MG tablet Take 325 mg by mouth daily with breakfast.   furosemide 80 MG tablet Commonly known as:  LASIX Take 1 tablet (80 mg total) by mouth daily. Start taking on:  10/16/2017 What changed:    medication strength  how much to take  when to take this  additional instructions   hydrOXYzine 25 MG tablet Commonly known as:  ATARAX/VISTARIL Take 25 mg by mouth at bedtime as needed for itching   lactulose 10 GM/15ML solution Commonly known as:  CHRONULAC Take 30 mLs by mouth 3 (three) times daily. Hold dose for more than 4 loose bowel movements per 24hr.   megestrol 40 MG tablet Commonly known as:  MEGACE Take 1 tablet (40 mg total) by mouth 2 (two) times daily. What changed:  when to take this   MIRENA (52 MG) 20 MCG/24HR IUD Generic drug:  levonorgestrel 1 each by Intrauterine route once.   RA VITAMIN D-3 2000 units Caps Generic drug:  Cholecalciferol Take 2,000 Units by mouth daily.   rifaximin 550 MG Tabs tablet Commonly known as:  XIFAXAN Take 550 mg by mouth 2 (two) times daily.   spironolactone 100 MG tablet Commonly known as:  ALDACTONE Take 2 tablets (200 mg total) by mouth daily. Start taking on:  10/16/2017 What changed:  how much to take   traMADol 50 MG tablet Commonly known as:  ULTRAM Take 1 tablet (50 mg total) by mouth every 6 (six) hours as needed. What changed:  See the new instructions.   VESICARE 5 MG tablet Generic drug:  solifenacin Take 5 mg by mouth daily.   VITAMIN C PO Take 1 tablet by mouth daily.      Allergies  Allergen Reactions  . Aspirin Other (See Comments)  Stomach aches   . Fexofenadine Other (See Comments)    Causes leg and back pain  . Hydrocodone Itching   Follow-up Information    Elizabeth Perna, NP. Schedule an appointment as soon as possible for a visit in 1 week(s).   Specialty:  Internal  Medicine Why:  Hospital follow-up Contact information: [place] [address] [phone]        Carol Ada, MD. Schedule an appointment as soon as possible for a visit in 1 week(s).   Specialty:  Gastroenterology Why:  Hospital follow-up Contact information: [place], [place] [address] [phone]            The results of significant diagnostics from this hospitalization (including imaging, microbiology, ancillary and laboratory) are listed below for reference.    Significant Diagnostic Studies: Dg Chest 2 View  Result Date: 10/07/2017 CLINICAL DATA:  Short of breath EXAM: CHEST  2 VIEW COMPARISON:  12/13/2013 FINDINGS: Low lung volumes. No focal consolidation or pleural effusion. Cardiomediastinal silhouette within normal limits. Aortic atherosclerosis. No pneumothorax. IMPRESSION: No active cardiopulmonary disease.  Low lung volumes. Electronically Signed   By: Donavan Foil M.D.   On: 10/07/2017 23:15   Nm Hepatobiliary Liver Func  Result Date: 10/08/2017 CLINICAL DATA:  RIGHT upper quadrant pain question cholecystitis, history of cirrhosis, hepatitis-C EXAM: NUCLEAR MEDICINE HEPATOBILIARY IMAGING TECHNIQUE: Sequential images of the abdomen were obtained out to 60 minutes following intravenous administration of radiopharmaceutical. Following 1 hour of imaging, patient was injected with morphine and imaging was continued for 30 minutes. RADIOPHARMACEUTICALS:  4.2 mCi Tc-68m  Choletec IV COMPARISON:  None FINDINGS: Normal tracer extraction from bloodstream indicating normal hepatocellular function. Normal excretion of tracer into biliary tree. Gallbladder did not visualize after 1 hour of imaging. Following morphine and an additional 30 minutes of imaging, the gallbladder failed to visualize. Small bowel was visualized by 20 minutes. Somewhat prolonged hepatic retention of tracer consistent with hepatocellular dysfunction.  IMPRESSION: Nonvisualization of the gallbladder despite morphine augmentation consistent with acute cholecystitis. Prolonged hepatic retention of tracer consistent with hepatocellular dysfunction; recommend correlation with LFTs. Electronically Signed   By: Lavonia Dana M.D.   On: 10/08/2017 19:15   Ct Abdomen Pelvis W Contrast  Result Date: 10/07/2017 CLINICAL DATA:  Epigastric abdominal pain EXAM: CT ABDOMEN AND PELVIS WITH CONTRAST TECHNIQUE: Multidetector CT imaging of the abdomen and pelvis was performed using the standard protocol following bolus administration of intravenous contrast. CONTRAST:  75 cc Isovue 300 intravenous COMPARISON:  CT abdomen pelvis 02/21/2016 FINDINGS: Lower chest: Lung bases demonstrate no acute consolidation or effusion. Borderline cardiomegaly. Hepatobiliary: Cirrhotic liver. Contracted gallbladder. No biliary dilatation. Pancreas: No ductal dilatation. Fluid around the pancreas likely related to generalized ascites. Spleen: Normal in size without focal abnormality. Adrenals/Urinary Tract: Adrenal glands are within normal limits. Stable cyst in the upper pole of the left kidney. The bladder is unremarkable Stomach/Bowel: The stomach is nonenlarged. Horizontal portion of the duodenum is fuzzy and indistinct with possible wall thickening. No colon wall thickening. Normal appendix Vascular/Lymphatic: Extensive aortic atherosclerosis. No aneurysmal dilatation. Tortuous distal esophageal and perigastric vessels consistent with varices. No grossly enlarged lymph nodes Reproductive: Uterus and bilateral adnexa are unremarkable. Intrauterine device is present Other: Moderate to large volume of ascites in the abdomen and pelvis. Generalized edema within the left greater than right body wall. No free air. Musculoskeletal: Degenerative changes. No acute or suspicious lesion IMPRESSION: 1. Cirrhotic morphology of the liver with moderate to large volume  of ascites in the abdomen and pelvis.  Multiple tortuous distal esophageal and perigastric vessels consistent with varices 2. Indistinct appearance of the third portion of duodenum with mild wall thickening, suggestive of duodenitis. Ascites fluid around the pancreas, suspect that this is related to generalized ascites as opposed to pancreatitis 3. Moderate to large amount of subcutaneous edema and fluid within the left greater than right flank. Electronically Signed   By: Donavan Foil M.D.   On: 10/07/2017 22:01   Dg Foot 2 Views Right  Result Date: 09/25/2017 Please see detailed radiograph report in office note.  US Abdomen Limited Ruq  Result Date: 10/07/2017 CLINICAL DATA:  Initial evaluation for acute right upper quadrant pain. EXAM: ULTRASOUND ABDOMEN LIMITED RIGHT UPPER QUADRANT COMPARISON:  Prior CT from earlier the same day. FINDINGS: Gallbladder: Gallbladder contracted. No echogenic stones identified. Wall is thickened up to 7.2 mm, like related incomplete distension and/or hepatic cirrhosis with ascites. Positive sonographic Murphy sign elicited on exam. Common bile duct: Diameter: 4.8 mm Liver: Liver demonstrates a coarse echogenic echotexture with nodular contour, compatible with cirrhosis. No discrete hepatic masses. Portal vein is patent on color Doppler imaging with hepatofugal blood flow away from the liver. Moderate volume ascites noted within the partially visualized abdomen. IMPRESSION: 1. Hepatic cirrhosis with moderate volume ascites and hepatofugal portal venous flow. 2. Contracted gallbladder without cholelithiasis or biliary dilatation. Gallbladder wall thickening felt to be related to incomplete distension and/or ascites/cirrhosis. Positive sonographic Percell Miller sign was elicited on exam. Electronically Signed   By: Jeannine Boga M.D.   On: 10/07/2017 22:27   Ir Paracentesis  Result Date: 10/08/2017 INDICATION: History of hepatitis-C with cirrhosis. Ascites present on recent CT scan. Request is made for  diagnostic and therapeutic paracentesis. EXAM: ULTRASOUND GUIDED DIAGNOSTIC AND THERAPEUTIC PARACENTESIS MEDICATIONS: 2% lidocaine COMPLICATIONS: None immediate. PROCEDURE: Informed written consent was obtained from the patient after a discussion of the risks, benefits and alternatives to treatment. A timeout was performed prior to the initiation of the procedure. Initial ultrasound scanning demonstrates a small amount of ascites within the right upper abdominal quadrant. The right upper abdomen was prepped and draped in the usual sterile fashion. 2% lidocaine was used for local anesthesia. Following this, a 19 gauge, 15-cm, Yueh catheter was introduced. An ultrasound image was saved for documentation purposes. The paracentesis was performed. The catheter was removed and a dressing was applied. The patient tolerated the procedure well without immediate post procedural complication. FINDINGS: A total of approximately 2 L of slightly cloudy serous, orange colored fluid was removed. Samples were sent to the laboratory as requested by the clinical team. IMPRESSION: Successful ultrasound-guided paracentesis yielding 2 liters of peritoneal fluid. Read by: Saverio Danker, PA-C Electronically Signed   By: Marybelle Killings M.D.   On: 10/08/2017 12:57    Microbiology: Recent Results (from the past 240 hour(s))  Culture, body fluid-bottle     Status: None   Collection Time: 10/08/17  1:20 PM  Result Value Ref Range Status   Specimen Description PERITONEAL  Final   Special Requests NONE  Final   Culture NO GROWTH 5 DAYS  Final   Report Status 10/13/2017 FINAL  Final  Gram stain     Status: None   Collection Time: 10/08/17  1:20 PM  Result Value Ref Range Status   Specimen Description PERITONEAL  Final   Special Requests NONE  Final   Gram Stain   Final    FEW WBC PRESENT, PREDOMINANTLY MONONUCLEAR NO ORGANISMS SEEN  Report Status 10/08/2017 FINAL  Final     Labs: Basic Metabolic Panel: Recent Labs  Lab  10/09/17 0612  10/11/17 0419 10/12/17 0356 10/13/17 1144 10/14/17 0843 10/15/17 0448  NA 134*   < > 132* 130* 130* 129* 132*  K 5.5*   < > 4.6 4.6 4.4 4.1 4.2  CL 106   < > 105 104 102 103 104  CO2 20*   < > 20* 21* 21* 19* 21*  GLUCOSE 102*   < > 94 114* 106* 132* 99  BUN 42*   < > 41* 36* 35* 33* 34*  CREATININE 2.03*   < > 1.69* 1.98* 1.89* 1.81* 1.81*  CALCIUM 8.1*   < > 8.5* 8.3* 8.0* 7.9* 8.0*  MG 2.1  --   --   --   --   --   --   PHOS 4.4  --   --   --   --   --   --    < > = values in this interval not displayed.   Liver Function Tests: Recent Labs  Lab 10/08/17 1317 10/09/17 0612 10/10/17 0703  AST 151* 129* 113*  ALT 75* 64* 60*  ALKPHOS 104 83 78  BILITOT 4.4* 4.5* 4.5*  PROT 6.7 5.9* 5.8*  ALBUMIN 1.9* 1.7* 1.9*   No results for input(s): LIPASE, AMYLASE in the last 168 hours. Recent Labs  Lab 10/08/17 1317  AMMONIA 20   CBC: Recent Labs  Lab 10/09/17 0612 10/09/17 1328 10/10/17 0703 10/11/17 0419 10/12/17 0356 10/15/17 0448  WBC 10.9*  --  9.3 8.5 10.0 9.1  NEUTROABS 8.6*  --   --   --   --   --   HGB 9.1* 9.7* 8.7* 8.8* 9.2* 9.1*  HCT 25.0* 26.2* 23.9* 24.2* 25.4* 24.8*  MCV 92.6  --  92.6 93.1 93.0 93.2  PLT 108*  --  97* 93* 93* 73*   Cardiac Enzymes: No results for input(s): CKTOTAL, CKMB, CKMBINDEX, TROPONINI in the last 168 hours. BNP: BNP (last 3 results) Recent Labs    10/07/17 2056  BNP 26.9    ProBNP (last 3 results) No results for input(s): PROBNP in the last 8760 hours.  CBG: Recent Labs  Lab 10/13/17 2233 10/14/17 0726 10/14/17 1208 10/14/17 1633 10/14/17 2149  GLUCAP 122* 88 109* 104* 107*       Signed:  Cachet Mccutchen  [facility] 10/15/2017, 10:21 AM

## 2017-10-15 NOTE — Discharge Instructions (Signed)

## 2017-12-06 ENCOUNTER — Encounter (HOSPITAL_COMMUNITY): Payer: Self-pay | Admitting: Emergency Medicine

## 2017-12-06 ENCOUNTER — Emergency Department (HOSPITAL_COMMUNITY): Payer: Medicaid Other

## 2017-12-06 ENCOUNTER — Inpatient Hospital Stay (HOSPITAL_COMMUNITY)
Admission: EM | Admit: 2017-12-06 | Discharge: 2017-12-16 | DRG: 432 | Disposition: A | Discharge status: Home / Self Care | Payer: Medicaid Other | Attending: Internal Medicine | Admitting: Internal Medicine

## 2017-12-06 ENCOUNTER — Other Ambulatory Visit: Payer: Self-pay

## 2017-12-06 ENCOUNTER — Inpatient Hospital Stay (HOSPITAL_COMMUNITY): Payer: Medicaid Other

## 2017-12-06 DIAGNOSIS — E875 Hyperkalemia: Secondary | ICD-10-CM | POA: Diagnosis present

## 2017-12-06 DIAGNOSIS — E1122 Type 2 diabetes mellitus with diabetic chronic kidney disease: Secondary | ICD-10-CM | POA: Diagnosis present

## 2017-12-06 DIAGNOSIS — Z885 Allergy status to narcotic agent status: Secondary | ICD-10-CM

## 2017-12-06 DIAGNOSIS — N19 Unspecified kidney failure: Secondary | ICD-10-CM

## 2017-12-06 DIAGNOSIS — K767 Hepatorenal syndrome: Secondary | ICD-10-CM | POA: Diagnosis present

## 2017-12-06 DIAGNOSIS — I10 Essential (primary) hypertension: Secondary | ICD-10-CM | POA: Diagnosis present

## 2017-12-06 DIAGNOSIS — D649 Anemia, unspecified: Secondary | ICD-10-CM | POA: Diagnosis not present

## 2017-12-06 DIAGNOSIS — E869 Volume depletion, unspecified: Secondary | ICD-10-CM | POA: Diagnosis not present

## 2017-12-06 DIAGNOSIS — F329 Major depressive disorder, single episode, unspecified: Secondary | ICD-10-CM

## 2017-12-06 DIAGNOSIS — N183 Chronic kidney disease, stage 3 unspecified: Secondary | ICD-10-CM | POA: Diagnosis present

## 2017-12-06 DIAGNOSIS — Z853 Personal history of malignant neoplasm of breast: Secondary | ICD-10-CM | POA: Diagnosis not present

## 2017-12-06 DIAGNOSIS — D6489 Other specified anemias: Secondary | ICD-10-CM | POA: Diagnosis not present

## 2017-12-06 DIAGNOSIS — R188 Other ascites: Secondary | ICD-10-CM | POA: Diagnosis present

## 2017-12-06 DIAGNOSIS — Z888 Allergy status to other drugs, medicaments and biological substances status: Secondary | ICD-10-CM | POA: Diagnosis not present

## 2017-12-06 DIAGNOSIS — K219 Gastro-esophageal reflux disease without esophagitis: Secondary | ICD-10-CM | POA: Diagnosis present

## 2017-12-06 DIAGNOSIS — R1011 Right upper quadrant pain: Secondary | ICD-10-CM

## 2017-12-06 DIAGNOSIS — Z833 Family history of diabetes mellitus: Secondary | ICD-10-CM

## 2017-12-06 DIAGNOSIS — E8770 Fluid overload, unspecified: Secondary | ICD-10-CM | POA: Diagnosis not present

## 2017-12-06 DIAGNOSIS — I129 Hypertensive chronic kidney disease with stage 1 through stage 4 chronic kidney disease, or unspecified chronic kidney disease: Secondary | ICD-10-CM | POA: Diagnosis present

## 2017-12-06 DIAGNOSIS — K828 Other specified diseases of gallbladder: Secondary | ICD-10-CM

## 2017-12-06 DIAGNOSIS — M160 Bilateral primary osteoarthritis of hip: Secondary | ICD-10-CM | POA: Diagnosis present

## 2017-12-06 DIAGNOSIS — R011 Cardiac murmur, unspecified: Secondary | ICD-10-CM | POA: Diagnosis not present

## 2017-12-06 DIAGNOSIS — Z79899 Other long term (current) drug therapy: Secondary | ICD-10-CM

## 2017-12-06 DIAGNOSIS — M19012 Primary osteoarthritis, left shoulder: Secondary | ICD-10-CM | POA: Diagnosis present

## 2017-12-06 DIAGNOSIS — N3281 Overactive bladder: Secondary | ICD-10-CM | POA: Diagnosis not present

## 2017-12-06 DIAGNOSIS — E119 Type 2 diabetes mellitus without complications: Secondary | ICD-10-CM

## 2017-12-06 DIAGNOSIS — Z87891 Personal history of nicotine dependence: Secondary | ICD-10-CM | POA: Diagnosis not present

## 2017-12-06 DIAGNOSIS — K7469 Other cirrhosis of liver: Secondary | ICD-10-CM

## 2017-12-06 DIAGNOSIS — K7460 Unspecified cirrhosis of liver: Secondary | ICD-10-CM | POA: Diagnosis present

## 2017-12-06 DIAGNOSIS — N289 Disorder of kidney and ureter, unspecified: Secondary | ICD-10-CM

## 2017-12-06 DIAGNOSIS — D696 Thrombocytopenia, unspecified: Secondary | ICD-10-CM

## 2017-12-06 DIAGNOSIS — R1010 Upper abdominal pain, unspecified: Secondary | ICD-10-CM

## 2017-12-06 DIAGNOSIS — T182XXA Foreign body in stomach, initial encounter: Secondary | ICD-10-CM

## 2017-12-06 DIAGNOSIS — G8929 Other chronic pain: Secondary | ICD-10-CM | POA: Diagnosis present

## 2017-12-06 DIAGNOSIS — G4700 Insomnia, unspecified: Secondary | ICD-10-CM | POA: Diagnosis present

## 2017-12-06 DIAGNOSIS — E871 Hypo-osmolality and hyponatremia: Secondary | ICD-10-CM | POA: Diagnosis not present

## 2017-12-06 DIAGNOSIS — Z886 Allergy status to analgesic agent status: Secondary | ICD-10-CM

## 2017-12-06 DIAGNOSIS — D72829 Elevated white blood cell count, unspecified: Secondary | ICD-10-CM | POA: Diagnosis not present

## 2017-12-06 DIAGNOSIS — B182 Chronic viral hepatitis C: Secondary | ICD-10-CM

## 2017-12-06 DIAGNOSIS — Z975 Presence of (intrauterine) contraceptive device: Secondary | ICD-10-CM | POA: Diagnosis not present

## 2017-12-06 DIAGNOSIS — M19011 Primary osteoarthritis, right shoulder: Secondary | ICD-10-CM | POA: Diagnosis present

## 2017-12-06 DIAGNOSIS — M1991 Primary osteoarthritis, unspecified site: Secondary | ICD-10-CM | POA: Diagnosis present

## 2017-12-06 DIAGNOSIS — R10811 Right upper quadrant abdominal tenderness: Secondary | ICD-10-CM | POA: Diagnosis not present

## 2017-12-06 DIAGNOSIS — R110 Nausea: Secondary | ICD-10-CM | POA: Diagnosis not present

## 2017-12-06 DIAGNOSIS — K319 Disease of stomach and duodenum, unspecified: Secondary | ICD-10-CM | POA: Diagnosis present

## 2017-12-06 DIAGNOSIS — N179 Acute kidney failure, unspecified: Secondary | ICD-10-CM | POA: Diagnosis not present

## 2017-12-06 DIAGNOSIS — R1013 Epigastric pain: Secondary | ICD-10-CM

## 2017-12-06 DIAGNOSIS — Z8719 Personal history of other diseases of the digestive system: Secondary | ICD-10-CM | POA: Diagnosis not present

## 2017-12-06 DIAGNOSIS — D631 Anemia in chronic kidney disease: Secondary | ICD-10-CM | POA: Diagnosis present

## 2017-12-06 DIAGNOSIS — D6959 Other secondary thrombocytopenia: Secondary | ICD-10-CM | POA: Diagnosis present

## 2017-12-06 DIAGNOSIS — R109 Unspecified abdominal pain: Secondary | ICD-10-CM

## 2017-12-06 DIAGNOSIS — R935 Abnormal findings on diagnostic imaging of other abdominal regions, including retroperitoneum: Secondary | ICD-10-CM | POA: Diagnosis not present

## 2017-12-06 DIAGNOSIS — R10812 Left upper quadrant abdominal tenderness: Secondary | ICD-10-CM | POA: Diagnosis not present

## 2017-12-06 HISTORY — DX: Unspecified chronic bronchitis: J42

## 2017-12-06 HISTORY — DX: Other seasonal allergic rhinitis: J30.2

## 2017-12-06 HISTORY — DX: Malignant neoplasm of unspecified site of left female breast: C50.912

## 2017-12-06 HISTORY — DX: Low back pain: M54.5

## 2017-12-06 HISTORY — DX: Type 2 diabetes mellitus without complications: E11.9

## 2017-12-06 HISTORY — DX: Other chronic pain: G89.29

## 2017-12-06 HISTORY — DX: Low back pain, unspecified: M54.50

## 2017-12-06 HISTORY — DX: Anxiety disorder, unspecified: F41.9

## 2017-12-06 LAB — URINALYSIS, ROUTINE W REFLEX MICROSCOPIC
BACTERIA UA: NONE SEEN
BILIRUBIN URINE: NEGATIVE
Glucose, UA: NEGATIVE mg/dL
KETONES UR: NEGATIVE mg/dL
LEUKOCYTES UA: NEGATIVE
Nitrite: NEGATIVE
Protein, ur: NEGATIVE mg/dL
SPECIFIC GRAVITY, URINE: 1.011 (ref 1.005–1.030)
pH: 6 (ref 5.0–8.0)

## 2017-12-06 LAB — COMPREHENSIVE METABOLIC PANEL
ALBUMIN: 2 g/dL — AB (ref 3.5–5.0)
ALT: 55 U/L — ABNORMAL HIGH (ref 14–54)
ANION GAP: 9 (ref 5–15)
AST: 94 U/L — AB (ref 15–41)
Alkaline Phosphatase: 79 U/L (ref 38–126)
BILIRUBIN TOTAL: 5.2 mg/dL — AB (ref 0.3–1.2)
BUN: 35 mg/dL — AB (ref 6–20)
CHLORIDE: 101 mmol/L (ref 101–111)
CO2: 19 mmol/L — AB (ref 22–32)
Calcium: 8.6 mg/dL — ABNORMAL LOW (ref 8.9–10.3)
Creatinine, Ser: 1.96 mg/dL — ABNORMAL HIGH (ref 0.44–1.00)
GFR calc Af Amer: 31 mL/min — ABNORMAL LOW (ref >60)
GFR calc non Af Amer: 26 mL/min — ABNORMAL LOW (ref >60)
GLUCOSE: 104 mg/dL — AB (ref 65–99)
POTASSIUM: 5.5 mmol/L — AB (ref 3.5–5.1)
SODIUM: 129 mmol/L — AB (ref 135–145)
TOTAL PROTEIN: 7 g/dL (ref 6.5–8.1)

## 2017-12-06 LAB — CBC WITH DIFFERENTIAL/PLATELET
BASOS PCT: 0 %
Basophils Absolute: 0 10*3/uL (ref 0.0–0.1)
EOS ABS: 0 10*3/uL (ref 0.0–0.7)
EOS PCT: 0 %
HCT: 28.7 % — ABNORMAL LOW (ref 36.0–46.0)
Hemoglobin: 10.4 g/dL — ABNORMAL LOW (ref 12.0–15.0)
LYMPHS ABS: 1 10*3/uL (ref 0.7–4.0)
Lymphocytes Relative: 10 %
MCH: 32.4 pg (ref 26.0–34.0)
MCHC: 36.2 g/dL — AB (ref 30.0–36.0)
MCV: 89.4 fL (ref 78.0–100.0)
MONOS PCT: 5 %
Monocytes Absolute: 0.5 10*3/uL (ref 0.1–1.0)
NEUTROS PCT: 85 %
Neutro Abs: 8.6 10*3/uL — ABNORMAL HIGH (ref 1.7–7.7)
PLATELETS: 71 10*3/uL — AB (ref 150–400)
RBC: 3.21 MIL/uL — AB (ref 3.87–5.11)
RDW: 15.9 % — ABNORMAL HIGH (ref 11.5–15.5)
WBC: 10.1 10*3/uL (ref 4.0–10.5)

## 2017-12-06 LAB — PROTIME-INR
INR: 2.12
PROTHROMBIN TIME: 23.6 s — AB (ref 11.4–15.2)

## 2017-12-06 LAB — LIPASE, BLOOD: LIPASE: 40 U/L (ref 11–51)

## 2017-12-06 MED ORDER — FENTANYL CITRATE (PF) 100 MCG/2ML IJ SOLN
12.5000 ug | INTRAMUSCULAR | Status: DC | PRN
  Administered 2017-12-06 – 2017-12-16 (×31): 12.5 ug via INTRAVENOUS
  Filled 2017-12-06 (×33): qty 2

## 2017-12-06 MED ORDER — VITAMIN D 1000 UNITS PO TABS
2000.0000 [IU] | ORAL_TABLET | Freq: Every day | ORAL | Status: DC
  Administered 2017-12-06 – 2017-12-16 (×11): 2000 [IU] via ORAL
  Filled 2017-12-06 (×11): qty 2

## 2017-12-06 MED ORDER — BOOST / RESOURCE BREEZE PO LIQD CUSTOM
1.0000 | Freq: Two times a day (BID) | ORAL | Status: DC
  Administered 2017-12-06: 1 via ORAL

## 2017-12-06 MED ORDER — MEGESTROL ACETATE 40 MG PO TABS
40.0000 mg | ORAL_TABLET | Freq: Two times a day (BID) | ORAL | Status: DC
  Administered 2017-12-06 – 2017-12-16 (×18): 40 mg via ORAL
  Filled 2017-12-06 (×22): qty 1

## 2017-12-06 MED ORDER — ONDANSETRON HCL 4 MG/2ML IJ SOLN
4.0000 mg | Freq: Once | INTRAMUSCULAR | Status: AC
  Administered 2017-12-06: 4 mg via INTRAVENOUS
  Filled 2017-12-06: qty 2

## 2017-12-06 MED ORDER — ONDANSETRON HCL 4 MG/2ML IJ SOLN
4.0000 mg | Freq: Four times a day (QID) | INTRAMUSCULAR | Status: AC | PRN
  Administered 2017-12-07: 4 mg via INTRAVENOUS
  Filled 2017-12-06: qty 2

## 2017-12-06 MED ORDER — FENTANYL CITRATE (PF) 100 MCG/2ML IJ SOLN: 25.0000 ug | INTRAMUSCULAR | Status: DC | PRN

## 2017-12-06 MED ORDER — HYDROXYZINE HCL 25 MG PO TABS
25.0000 mg | ORAL_TABLET | Freq: Every evening | ORAL | Status: DC | PRN
Start: 2017-12-06 — End: 2017-12-16
  Administered 2017-12-09 – 2017-12-15 (×5): 25 mg via ORAL
  Filled 2017-12-06 (×5): qty 1

## 2017-12-06 MED ORDER — LACTULOSE 10 GM/15ML PO SOLN
20.0000 g | Freq: Three times a day (TID) | ORAL | Status: DC
  Administered 2017-12-06 – 2017-12-16 (×25): 20 g via ORAL
  Filled 2017-12-06 (×29): qty 30

## 2017-12-06 MED ORDER — DARIFENACIN HYDROBROMIDE ER 7.5 MG PO TB24
7.5000 mg | ORAL_TABLET | Freq: Every day | ORAL | Status: DC
  Administered 2017-12-07 – 2017-12-16 (×10): 7.5 mg via ORAL
  Filled 2017-12-06 (×11): qty 1

## 2017-12-06 MED ORDER — LIDOCAINE HCL (PF) 2 % IJ SOLN
INTRAMUSCULAR | Status: AC
  Filled 2017-12-06: qty 20

## 2017-12-06 MED ORDER — MORPHINE SULFATE (PF) 4 MG/ML IV SOLN
4.0000 mg | Freq: Once | INTRAVENOUS | Status: AC
  Administered 2017-12-06: 4 mg via INTRAVENOUS
  Filled 2017-12-06: qty 1

## 2017-12-06 MED ORDER — ADULT MULTIVITAMIN W/MINERALS CH
1.0000 | ORAL_TABLET | Freq: Every day | ORAL | Status: DC
  Administered 2017-12-06 – 2017-12-16 (×11): 1 via ORAL
  Filled 2017-12-06 (×11): qty 1

## 2017-12-06 MED ORDER — ENOXAPARIN SODIUM 40 MG/0.4ML ~~LOC~~ SOLN: 40.0000 mg | SUBCUTANEOUS | Status: DC

## 2017-12-06 MED ORDER — RIFAXIMIN 550 MG PO TABS
550.0000 mg | ORAL_TABLET | Freq: Two times a day (BID) | ORAL | Status: DC
  Administered 2017-12-06 – 2017-12-16 (×21): 550 mg via ORAL
  Filled 2017-12-06 (×21): qty 1

## 2017-12-06 MED ORDER — FERROUS SULFATE 325 (65 FE) MG PO TABS
325.0000 mg | ORAL_TABLET | Freq: Every day | ORAL | Status: DC
  Administered 2017-12-07 – 2017-12-16 (×10): 325 mg via ORAL
  Filled 2017-12-06 (×11): qty 1

## 2017-12-06 MED ORDER — PIPERACILLIN-TAZOBACTAM 3.375 G IVPB
3.3750 g | Freq: Three times a day (TID) | INTRAVENOUS | Status: DC
  Administered 2017-12-06 – 2017-12-12 (×17): 3.375 g via INTRAVENOUS
  Filled 2017-12-06 (×19): qty 50

## 2017-12-06 MED ORDER — FUROSEMIDE 80 MG PO TABS
80.0000 mg | ORAL_TABLET | Freq: Every day | ORAL | Status: DC
  Administered 2017-12-06 – 2017-12-07 (×2): 80 mg via ORAL
  Filled 2017-12-06 (×2): qty 1

## 2017-12-06 MED ORDER — FENTANYL CITRATE (PF) 100 MCG/2ML IJ SOLN: 50.0000 ug | Freq: Once | INTRAMUSCULAR | Status: DC

## 2017-12-06 MED ORDER — ALBUMIN HUMAN 25 % IV SOLN
12.5000 g | Freq: Three times a day (TID) | INTRAVENOUS | Status: DC
Start: 2017-12-06 — End: 2017-12-16
  Administered 2017-12-06 – 2017-12-16 (×30): 12.5 g via INTRAVENOUS
  Filled 2017-12-06 (×35): qty 50

## 2017-12-06 MED ORDER — ONDANSETRON HCL 4 MG/2ML IJ SOLN: 4.0000 mg | Freq: Once | INTRAMUSCULAR | Status: DC | PRN

## 2017-12-06 NOTE — H&P (Addendum)
Date: 12/06/2017               Patient Name:  Elizabeth Mclean MRN: [id number]  DOB: 24-Apr-1956 Age / Sex: 62 y.o., female   PCP: Kerin Perna, NP         Medical Service: Internal Medicine Teaching Service         Attending Physician: Dr. Lucious Groves, DO    First Contact: Dr. Frederico Hamman Pager: [phone]  Second Contact: Dr. Jari Favre Pager: [phone]       After Hours (After 5p/  First Contact Pager: [phone]  weekends / holidays): Second Contact Pager: [phone]   Chief Complaint: Abdominal pain  History of Present Illness: Patient is a 62 year old female with a past medical history of hepatitis C, cirrhosis, hypertension, diabetes, GERD, breast cancer in 2011, chronic pain, depression presenting to the hospital with a chief complaint of epigastric/right upper quadrant abdominal pain.  States her pain started yesterday when after she woke up in the morning to use the bathroom.  She describes is that stabbing in character and constant in nature.  It is 8-9 out of 10 in intensity and worse with movement.  She is also been feeling nauseous since yesterday but no episodes of vomiting.  She has not been able to eat solid food but has been keeping herself hydrated.  Reports having occasional heartburn which is well controlled with over-the-counter Zantac.  Denies having any GERD symptoms at present.  Denies having any fevers, chills, diarrhea, or constipation.  States she takes lactulose with a goal of 2-3 bowel movements per day.  She is being followed by Dr. Almyra Free (GI).  States her last office visit with him was 4 days ago when he increased the dose of her Lasix and spironolactone.  Upon arrival to the ED, vital signs stable.  Labs showing normal lipase, sodium 129, potassium 5.5, bicarb 19, creatinine 1.9, T bili 5.2.  No white count.  INR 2.1.  Right upper quadrant ultrasound showing mild thickening of the gallbladder wall and ascites.  No gallstones reported.  Meds:  Current Meds    Medication Sig  . Alpha-D-Galactosidase (BEANO) TABS Take 1-2 tablets by mouth daily as needed (to prevent gas with certain foods).  . Ascorbic Acid (VITAMIN C PO) Take 1 tablet by mouth daily.  . ferrous sulfate 325 (65 FE) MG tablet Take 325 mg by mouth daily with breakfast.  . furosemide (LASIX) 80 MG tablet Take 1 tablet (80 mg total) by mouth daily.  . hydrOXYzine (ATARAX/VISTARIL) 25 MG tablet Take 25 mg by mouth at bedtime as needed for itching  . lactulose (CHRONULAC) 10 GM/15ML solution Take 30 mLs by mouth 3 (three) times daily. Hold dose for more than 4 loose bowel movements per 24hr.  Marland Kitchen levonorgestrel (MIRENA, 52 MG,) 20 MCG/24HR IUD 1 each by Intrauterine route once.  . megestrol (MEGACE) 40 MG tablet Take 1 tablet (40 mg total) by mouth 2 (two) times daily.  Marland Kitchen RA VITAMIN D-3 2000 units CAPS Take 2,000 Units by mouth daily.   . rifaximin (XIFAXAN) 550 MG TABS tablet Take 550 mg by mouth 2 (two) times daily.  Marland Kitchen spironolactone (ALDACTONE) 100 MG tablet Take 2 tablets (200 mg total) by mouth daily.  . traMADol (ULTRAM) 50 MG tablet Take 1 tablet (50 mg total) by mouth every 6 (six) hours as needed.  . VESICARE 5 MG tablet Take 5 mg by mouth daily.     Allergies: Allergies as of  12/06/2017 - Review Complete 12/06/2017  Allergen Reaction Noted  . Aspirin Other (See Comments) 02/08/2011  . Fexofenadine Other (See Comments)   . Hydrocodone Itching 04/16/2016   Past Medical History:  Diagnosis Date  . Allergy   . Arthritis    hips, shoulders, face  . Bilateral swelling of feet   . Bone spur of foot   . Breast cancer ([place]) 2011  . Cancer [facility]) 2011   Left Breast cancer  . Chronic pain   . Depression   . Diabetes mellitus without complication ([place])   . GERD (gastroesophageal reflux disease)   . Hepatitis C   . Hypertension   . Insomnia   . Leg cramps   . Personal history of radiation therapy 2011   Left Breast Cancer    Family History:   Mother-diabetes Father-diabetes  Social History: No ethanol or illicit drug use.  Former smoker.  Review of Systems: A complete ROS was negative except as per HPI.   Physical Exam: Blood pressure (!) 147/58, pulse 89, temperature 98 F (36.7 C), temperature source Axillary, resp. rate 18, height 5\' 7"  (1.702 m), weight 264 lb 15.9 oz (120.2 kg), last menstrual period 06/04/2017, SpO2 100 %. Physical Exam  Constitutional: She is oriented to person, place, and time. She appears well-developed and well-nourished. No distress.  Lying comfortably in hospital stretcher.  HENT:  Head: Normocephalic and atraumatic.  Dry mucous membranes  Eyes: Scleral icterus is present.  Cardiovascular: Normal rate, regular rhythm and intact distal pulses. Exam reveals no gallop and no friction rub.  No murmur heard. Pulmonary/Chest: Effort normal and breath sounds normal. No respiratory distress. She has no wheezes. She has no rales.  Abdominal: Soft. Bowel sounds are normal.  Epigastrium and right upper quadrant tender to palpation.  No rebound or rigidity.  Musculoskeletal:  +2 pitting edema of bilateral lower extremities  Neurological: She is alert and oriented to person, place, and time.  Skin: Skin is warm and dry.   Assessment & Plan by Problem: Active Problems:   Abdominal pain   62 year old female with a past medical history of hepatitis C, cirrhosis, hypertension, diabetes, GERD, breast cancer in 2011, chronic pain, depression presenting to the hospital with a chief complaint of epigastric/right upper quadrant abdominal pain.  Possible acalculous cholecystitis: Patient is presenting with a 1 day history of epigastric and right upper quadrant abdominal pain.  Right upper quadrant ultrasound showing ascites and mild thickening of the gallbladder wall which could possibly be due to acalculous cholecystitis versus low protein state due to her liver cirrhosis.  AST 94 and ALT 55 which are improved  since prior labs done in January.  T bili is now worse at 5.2.  She was hospitalized for similar complaint in January 2019 and HIDA scan at that time revealed acute cholecystitis.  At that time, patient was managed conservatively with antibiotics and improved clinically.  Abdominal pain could also be related to her ascites. She does not have leukocytosis.  However, cannot rule out infection as she may not be able to mount an immune response in the setting of advanced liver disease.  As such, there remains concern for possible SBP. I spoke to her gastroenterologist Dr. Benson Norway and he recommended speaking to surgery as he thinks her abdominal may be related to gallbladder etiology. He also recommended starting the patient on an albumin infusion and sodium restricted diet. -Admit to [facility] for empiric coverage -Spoke to surgery PA. She will discuss the case with  her attending and leave recommendations. -Diagnostic and therapeutic paracentesis -25% concentrated albumin 12.5 g 3 times a day -Fentanyl 12.5 mg every 2 hours as needed for pain -Continue home rifaximin -Restrict sodium to less than 2 g/day -Nutrition consult -CBC in a.m. -CMP in a.m.  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  Labs showing sodium 129 (baseline 129-130).  INR 2.1 due to decreased hepatic synthetic function.  Potassium 5.5 in the setting of spironolactone use. Her creatinine was 1.1 and December 2018 but has been getting progressively worse since January.  At present creatinine is 1.9.  AST 94 and ALT 55 which are improved since prior labs done in January.  T bili is now worse at 5.2.  -Continue Lasix 80 mg daily -Hold home spironolactone -Continue home lactulose. Titrate to 2-3 bowel movements per day.  -Restrict sodium to less than 2 g/day -25% concentrated albumin 12.5 g 3 times a day  Mild hyperkalemia: Potassium 5.5 in the setting of spironolactone use. -Hold  spironolactone -Continue Lasix and lactulose  Chronic anemia and thrombocytopenia: Likely related to cirrhosis.  Hemoglobin stable at 10.4.  Platelets 71,000, stable since last reading a month ago. -Monitor CBC -Continue home ferrous sulfate  Overactive bladder -Darifenacin (home Vesicare not in formulary)  Diet: Heart healthy with sodium restriction (less than 2 g/day)  DVT prophylaxis: SCDs  CODE STATUS: Full code (discussed with patient)  Dispo: Admit patient to Inpatient with expected length of stay greater than 2 midnights.  Signed: Shela Leff, MD 12/06/2017, 2:16 PM  Pager: [phone]

## 2017-12-06 NOTE — Progress Notes (Signed)
Pt arrived to unit at approximately 1330 from the ED.  Pt arrived on stretcher, placed in bed and the RN left.  Pt VS were taken, this RN assessed the Pt, and notified MD of the Pt's arrival to the unit.

## 2017-12-06 NOTE — Progress Notes (Addendum)
[facility] has called about this patient.  She has a history of hep C with cirrhosis.  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  She was admitted in January with similar complaints, but improved with conservative medical management.  The patient returns today with epigastric and some abdominal pain.  She had a RUQ [place] completed that revealed no gallstones, ascites, and some mild gallbladder wall thickening.  We were asked for opinions of the current situation.  The patient is not a surgical candidate at least at this facility.  In order to have an operation she would need tx to a tertiary care facility where a hepatologist was available.  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  A repeat HIDA scan in the setting of cirrhosis can give a false positive and would not be terribly beneficial at this point.  If the patient does not improve, a perc chole drain could be considered, but given her ascites etc, this is likely not a great option for her either.  I have discussed this case with Dr. Grandville Silos who agrees and recommends the above.  Conservative management is our recommendation.  Given the patient is not a surgical candidate we will defer care to the primary service.  Henreitta Cea 2:55 PM 12/06/2017

## 2017-12-06 NOTE — ED Triage Notes (Signed)
Pt BIB EMS from home. Reports RUQ pain for approx 24hrs. Hx of gall bladder issues and states this feels similar. +N/V/D. +HepC. VSS

## 2017-12-06 NOTE — ED Provider Notes (Signed)
[facility] EMERGENCY DEPARTMENT Provider Note   CSN: [id number] Arrival date & time: 12/06/17  8127     History   Chief Complaint Chief Complaint  Patient presents with  . Abdominal Pain    HPI Elizabeth Mclean is a 62 y.o. female.  The history is provided by the patient.  Abdominal Pain    She has history of diabetes, hypertension, hepatitis C with cirrhosis and comes in complaining of upper abdominal pain which started yesterday morning and is getting worse.  There is associated nausea but no vomiting.  She has chronic diarrhea because she takes lactulose, and this has been unchanged.  Pain is not affected by bowel movement.  She rates pain a 10/10.  She denies fever or chills.  Pain sometimes radiates to the lower abdomen but not to the back or to the chest.  She has not taken anything for the pain.  Nothing makes it better, nothing makes it worse.  Past Medical History:  Diagnosis Date  . Allergy   . Arthritis    hips, shoulders, face  . Bilateral swelling of feet   . Bone spur of foot   . Breast cancer ([place]) 2011  . Cancer [facility]) 2011   Left Breast cancer  . Chronic pain   . Depression   . Diabetes mellitus without complication ([place])   . GERD (gastroesophageal reflux disease)   . Hepatitis C   . Hypertension   . Insomnia   . Leg cramps   . Personal history of radiation therapy 2011   Left Breast Cancer    Patient Active Problem List   Diagnosis Date Noted  . Anasarca 10/08/2017  . Occult blood positive stool 10/08/2017  . RUQ abdominal pain 10/08/2017  . Thrombocytopenia ([place]) 10/08/2017  . Liver cirrhosis ([place]) 06/17/2017  . Menopausal bleeding 05/15/2016  . 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 HYPERTENSION, BENIGN ESSENTIAL, CONTROLLED 05/21/2007  . COCAINE ABUSE 05/15/2007  . ALLERGIC RHINITIS 05/15/2007  . POSTMENOPAUSAL STATUS 05/15/2007  .  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 BREAST LUMPECTOMY Left 2011  . BREAST SURGERY    . COLONOSCOPY    . epidural steroid injection    . ESOPHAGOGASTRODUODENOSCOPY (EGD) WITH PROPOFOL N/A 10/11/2017   Procedure: ESOPHAGOGASTRODUODENOSCOPY (EGD) WITH PROPOFOL;  Surgeon: Carol Ada, MD;  Location: [facility];  Service: Endoscopy;  Laterality: N/A;  . HYSTEROSCOPY W/D&C N/A 05/15/2016   Procedure: DILATATION AND CURETTAGE /HYSTEROSCOPY;  Surgeon: Lavonia Drafts, MD;  Location: [facility];  Service: Gynecology;  Laterality: N/A;  . IR PARACENTESIS  10/08/2017  . MOUTH SURGERY    . THUMB SURGERY      OB History    Gravida  5   Para  4   Term      Preterm      AB  1   Living  3     SAB      TAB  1   Ectopic      Multiple      Live Births  4            Home Medications    Prior to Admission medications   Medication Sig Start Date End Date Taking? Authorizing Provider  Alpha-D-Galactosidase Satira Mccallum) TABS Take 1-2 tablets by mouth daily as needed (to prevent gas with certain foods).  [provider]  Ascorbic Acid (VITAMIN C PO) Take 1 tablet by mouth daily.    [provider]  ferrous sulfate 325 (65 FE) MG tablet Take 325 mg by mouth daily with breakfast.    [provider]  furosemide (LASIX) 80 MG tablet Take 1 tablet (80 mg total) by mouth daily. 10/16/17   Cristal Ford, DO  hydrOXYzine (ATARAX/VISTARIL) 25 MG tablet Take 25 mg by mouth at bedtime as needed for itching 08/23/17   [provider]  lactulose (CHRONULAC) 10 GM/15ML solution Take 30 mLs by mouth 3 (three) times daily. Hold dose for more than 4 loose bowel movements per 24hr. 06/11/17   [provider]  levonorgestrel (MIRENA, 52 MG,) 20 MCG/24HR IUD 1 each by Intrauterine route once.    [provider]  megestrol (MEGACE) 40 MG tablet Take 1 tablet (40 mg total) by  mouth 2 (two) times daily. Patient taking differently: Take 40 mg by mouth daily.  09/05/17   Degele, [name], MD  RA VITAMIN D-3 2000 units CAPS Take 2,000 Units by mouth daily.  01/09/16   [provider]  rifaximin (XIFAXAN) 550 MG TABS tablet Take 550 mg by mouth 2 (two) times daily.    [provider]  spironolactone (ALDACTONE) 100 MG tablet Take 2 tablets (200 mg total) by mouth daily. 10/16/17   Mikhail, Velta Addison, DO  traMADol (ULTRAM) 50 MG tablet Take 1 tablet (50 mg total) by mouth every 6 (six) hours as needed. 10/15/17   Mikhail, Velta Addison, DO  VESICARE 5 MG tablet Take 5 mg by mouth daily. 02/08/16   [provider]    Family History Family History  Problem Relation Age of Onset  . Diabetes Mother   . Diabetes Father   . Hypertension Other     Social History Social History   Tobacco Use  . Smoking status: Former Research scientist (life sciences)  . Smokeless tobacco: Never Used  Substance Use Topics  . Alcohol use: No  . Drug use: No     Allergies   Aspirin; Fexofenadine; and Hydrocodone   Review of Systems Review of Systems  Gastrointestinal: Positive for abdominal pain.  All other systems reviewed and are negative.    Physical Exam Updated Vital Signs BP (!) 137/40 (BP Location: Left Arm)   Pulse 88   Temp 98.9 F (37.2 C) (Oral)   Resp 16   Ht 5\' 7"  (1.702 m)   Wt 124.3 kg (274 lb)   LMP 06/04/2017 (LMP Unknown)   SpO2 100%   BMI 42.91 kg/m   Physical Exam  Nursing note and vitals reviewed.  62 year old female, resting comfortably and in no acute distress. Vital signs are normal. Oxygen saturation is 100%, which is normal. Head is normocephalic and atraumatic. PERRLA, EOMI. Oropharynx is clear. Neck is nontender and supple without adenopathy or JVD. Back is nontender and there is no CVA tenderness. Lungs are clear without rales, wheezes, or rhonchi. Chest is nontender. Heart has regular rate and rhythm without murmur. Abdomen is soft, flat,  with moderate tenderness across the upper abdomen.  There is no rebound or guarding.  There is no lower abdominal tenderness.  There are no masses or hepatosplenomegaly and peristalsis is hypoactive. Extremities have no cyanosis or edema, full range of motion is present. Skin is warm and dry without rash. Neurologic: Mental status is normal, cranial nerves are intact, there are no motor or sensory deficits.  ED Treatments / Results  Labs (all labs  ordered are listed, but only abnormal results are displayed) Labs Reviewed  COMPREHENSIVE METABOLIC PANEL - Abnormal; Notable for the following components:      Result Value   Sodium 129 (*)    Potassium 5.5 (*)    CO2 19 (*)    Glucose, Bld 104 (*)    BUN 35 (*)    Creatinine, Ser 1.96 (*)    Calcium 8.6 (*)    Albumin 2.0 (*)    AST 94 (*)    ALT 55 (*)    Total Bilirubin 5.2 (*)    GFR calc non Af Amer 26 (*)    GFR calc Af Amer 31 (*)    All other components within normal limits  URINALYSIS, ROUTINE W REFLEX MICROSCOPIC - Abnormal; Notable for the following components:   Hgb urine dipstick MODERATE (*)    Squamous Epithelial / LPF 0-5 (*)    All other components within normal limits  PROTIME-INR - Abnormal; Notable for the following components:   Prothrombin Time 23.6 (*)    All other components within normal limits  LIPASE, BLOOD  CBC WITH DIFFERENTIAL/PLATELET   Procedures Procedures  Medications Ordered in ED Medications  ondansetron (ZOFRAN) injection 4 mg (has no administration in time range)  morphine 4 MG/ML injection 4 mg (has no administration in time range)  ondansetron (ZOFRAN) injection 4 mg (4 mg Intravenous Given 12/06/17 0620)  morphine 4 MG/ML injection 4 mg (4 mg Intravenous Given 12/06/17 0620)     Initial Impression / Assessment and Plan / ED Course  I have reviewed the triage vital signs and the nursing notes.  Pertinent lab results that were available during my care of the patient were reviewed by me  and considered in my medical decision making (see chart for details).  Abdominal pain with nausea in patient with known history of cirrhosis.  Old records are reviewed, and she also had a hospitalization 2 months ago for upper abdominal pain and had a positive HIDA scan.  Surgical consult at that time stated they did not want to do cholecystectomy because of underlying cirrhosis.  Presumably, if this pain is from biliary colic, that recommendation would still be in force.  She is given IV fluids, morphine, ondansetron.  Screening labs obtained.  Symptoms today certainly are consistent with recurrent cholecystitis.  Ultrasound would not be valuable given the fact that she had a calculus cholecystitis at previous hospitalization.  Doubt bacterial peritonitis given localization of findings to the upper abdomen.  She had modest relief with morphine and is given additional morphine.  Elevated transaminases and bilirubin are not significantly changed from baseline.  Renal insufficiency is present also not significantly changed from baseline.  CBC is pending.  Case is signed out to Dr. Dolly Rias.  Final Clinical Impressions(s) / ED Diagnoses   Final diagnoses:  Upper abdominal pain  Hepatic cirrhosis, unspecified hepatic cirrhosis type, unspecified whether ascites present [facility])  Renal insufficiency    ED Discharge Orders    None       Delora Fuel, MD 71/69/67 [phone]

## 2017-12-06 NOTE — Progress Notes (Signed)
Initial Nutrition Assessment  DOCUMENTATION CODES:   Obesity unspecified  INTERVENTION:  - Will order Boost Breeze BID, each supplement provides 250 kcal and 9 grams of protein - Will order daily multivitamin with minerals.  - Continue to encourage PO intakes.   NUTRITION DIAGNOSIS:   Inadequate oral intake related to acute illness, chronic illness, early satiety, decreased appetite as evidenced by per patient/family report.  GOAL:   Patient will meet greater than or equal to 90% of their needs  MONITOR:   PO intake, Supplement acceptance, Weight trends, Labs  REASON FOR ASSESSMENT:   Consult Assessment of nutrition requirement/status  ASSESSMENT:   62 year old with history of diabetes, hypertension, hepatitis C with cirrhosis and comes in complaining of upper abdominal pain which started yesterday morning and is getting worse. There is associated nausea but no vomiting. She has chronic diarrhea because she takes lactulose, and this has been unchanged. Pain is not affected by bowel movement. She rates pain a 10/10.  No intakes documented as diet advanced from NPO ~1 hour ago. Pt reports feeling hungry at this time and applesauce and popsicle, per preferences, provided. Pt reports that severe abdominal pain with associated nausea began yesterday morning. The last thing she ate was part of a Lean Cuisine on 3/20 evening. She reports that since January she has felt that she needs to eat as soon as she feels hungry otherwise she has difficulty making herself eat and will often feel very nauseated if she tries to eat when not hungry. The nausea causes her to feel very nervous.   She reports that her sister died 23 month ago and she has been depressed since that time. She reports that this has also affected her appetite. Her PCP provided her with antinausea medication but she takes it intermittently. She reports PCP gave her a list of foods not to eat which included salads (which she  greatly enjoys) d/t "ruffage" and Mrs. Deliah Boston (which she frequently used) due to "possibly having hidden salt."  At home she was taking 40 mg Lasix/day and on 3/18 it was increased to 80 mg/day. She states that in January she has 2L of fluid removed during thoracentesis and this scared her.   Physical assessment showed no muscle and no fat wasting. Per chart review, she has lost 27 lbs (9.3% body weight) in the past 2 months. This is significant for time frame. Unable to confidently state malnutrition at this time but will continue to monitor.  Medications reviewed; 12.5 g albumin TID, 2000 units vitamin D/day, 325 mg ferrous sulfate/day, 80 mg oral Lasix/day, 20 g lactulose TID, 40 mg Megace BID, 4 g IV Zofran x1 today.  Labs reviewed; Na: 129 mmol/L, K: 5.5 mg/dL, BUN: 35 mg/dL, creatinine: 1.96 mg/dL, Ca: 8.6 mg/dL, LFTs elevated, GFR: 26 mL/min.       NUTRITION - FOCUSED PHYSICAL EXAM:  Completed/assessed with no muscle and no fat wasting noted.   Diet Order:  Diet Heart Room service appropriate? Yes; Fluid consistency: Thin  EDUCATION NEEDS:   No education needs have been identified at this time  Skin:  Skin Assessment: Reviewed RN Assessment  Last BM:  PTA/unknown  Height:   Ht Readings from Last 1 Encounters:  12/06/17 5\' 7"  (1.702 m)    Weight:   Wt Readings from Last 1 Encounters:  12/06/17 264 lb 15.9 oz (120.2 kg)    Ideal Body Weight:  61.36 kg  BMI:  Body mass index is 41.5 kg/m.  Estimated  Nutritional Needs:   Kcal:  2165-2300  Protein:  105-120 grams  Fluid:  >/= 1.8 L/day     Jarome Matin, MS, RD, LDN, [facility] Inpatient Clinical Dietitian Pager # [phone] After hours/weekend pager # [phone]

## 2017-12-07 ENCOUNTER — Inpatient Hospital Stay (HOSPITAL_COMMUNITY): Payer: Medicaid Other

## 2017-12-07 DIAGNOSIS — N179 Acute kidney failure, unspecified: Secondary | ICD-10-CM | POA: Diagnosis present

## 2017-12-07 DIAGNOSIS — E875 Hyperkalemia: Secondary | ICD-10-CM | POA: Diagnosis present

## 2017-12-07 DIAGNOSIS — R188 Other ascites: Secondary | ICD-10-CM | POA: Diagnosis present

## 2017-12-07 LAB — COMPREHENSIVE METABOLIC PANEL
ALK PHOS: 62 U/L (ref 38–126)
ALT: 48 U/L (ref 14–54)
AST: 67 U/L — ABNORMAL HIGH (ref 15–41)
Albumin: 2.4 g/dL — ABNORMAL LOW (ref 3.5–5.0)
Anion gap: 9 (ref 5–15)
BUN: 43 mg/dL — ABNORMAL HIGH (ref 6–20)
CALCIUM: 8.5 mg/dL — AB (ref 8.9–10.3)
CO2: 19 mmol/L — ABNORMAL LOW (ref 22–32)
CREATININE: 2.43 mg/dL — AB (ref 0.44–1.00)
Chloride: 98 mmol/L — ABNORMAL LOW (ref 101–111)
GFR, EST AFRICAN AMERICAN: 24 mL/min — AB (ref >60)
GFR, EST NON AFRICAN AMERICAN: 20 mL/min — AB (ref >60)
Glucose, Bld: 124 mg/dL — ABNORMAL HIGH (ref 65–99)
Potassium: 5.1 mmol/L (ref 3.5–5.1)
Sodium: 126 mmol/L — ABNORMAL LOW (ref 135–145)
TOTAL PROTEIN: 7 g/dL (ref 6.5–8.1)
Total Bilirubin: 7 mg/dL — ABNORMAL HIGH (ref 0.3–1.2)

## 2017-12-07 LAB — CBC
HCT: 27 % — ABNORMAL LOW (ref 36.0–46.0)
HEMOGLOBIN: 10 g/dL — AB (ref 12.0–15.0)
MCH: 33.6 pg (ref 26.0–34.0)
MCHC: 37 g/dL — AB (ref 30.0–36.0)
MCV: 90.6 fL (ref 78.0–100.0)
Platelets: 94 10*3/uL — ABNORMAL LOW (ref 150–400)
RBC: 2.98 MIL/uL — ABNORMAL LOW (ref 3.87–5.11)
RDW: 16.1 % — AB (ref 11.5–15.5)
WBC: 5.5 10*3/uL (ref 4.0–10.5)

## 2017-12-07 MED ORDER — LIDOCAINE HCL (PF) 1 % IJ SOLN
INTRAMUSCULAR | Status: AC
Start: 2017-12-07 — End: 2017-12-07
  Filled 2017-12-07: qty 30

## 2017-12-07 MED ORDER — HYDROMORPHONE HCL 1 MG/ML IJ SOLN
1.0000 mg | Freq: Once | INTRAMUSCULAR | Status: AC
  Administered 2017-12-07: 1 mg via INTRAVENOUS
  Filled 2017-12-07: qty 1

## 2017-12-07 MED ORDER — BOOST / RESOURCE BREEZE PO LIQD CUSTOM
1.0000 | Freq: Two times a day (BID) | ORAL | Status: DC
Start: 2017-12-07 — End: 2017-12-11
  Administered 2017-12-08 – 2017-12-10 (×4): 1 via ORAL
  Administered 2017-12-10: 10:00:00 via ORAL
  Administered 2017-12-11: 1 via ORAL

## 2017-12-07 NOTE — Progress Notes (Signed)
Nutrition Follow-up  DOCUMENTATION CODES:   Obesity unspecified  INTERVENTION:   -Continue Boost Breeze po BID, each supplement provides 250 kcal and 9 grams of protein -Continue MVI daily  NUTRITION DIAGNOSIS:   Inadequate oral intake related to acute illness, chronic illness, early satiety, decreased appetite as evidenced by per patient/family report.  Ongoing  GOAL:   Patient will meet greater than or equal to 90% of their needs  Progressing  MONITOR:   PO intake, Supplement acceptance, Weight trends, Labs  REASON FOR ASSESSMENT:   Consult Assessment of nutrition requirement/status  ASSESSMENT:   62 year old with history of diabetes, hypertension, hepatitis C with cirrhosis and comes in complaining of upper abdominal pain which started yesterday morning and is getting worse. There is associated nausea but no vomiting. She has chronic diarrhea because she takes lactulose, and this has been unchanged. Pain is not affected by bowel movement. She rates pain a 10/10.  RD re-consulted to assessment nutritonal status and needs. RD assessed pt on 12/06/17; please refer to RD note for additional details.   Spoke with pt, who was sitting in recliner chair at time of visit. Pt endorses continued poor appetite, shares she is consuming mostly fruit juices and popsicles. Noted unopened Boost Breeze supplement at bedside; pt reports she has not tried supplement yet due to it not being cold. RD provided pt with cold supplement from fridge, which pt tried and reported she liked.   Medications reviewed and include ferrous sulfate, megace, MVI, and IV albumin.  Labs reviewed: Na: 126, Lipase: 40, AST: 67. ALT WDL.   Diet Order:  Diet Heart Room service appropriate? Yes; Fluid consistency: Thin  EDUCATION NEEDS:   No education needs have been identified at this time  Skin:  Skin Assessment: Reviewed RN Assessment  Last BM:  12/04/17  Height:   Ht Readings from Last 1 Encounters:   12/06/17 5\' 7"  (1.702 m)    Weight:   Wt Readings from Last 1 Encounters:  12/06/17 264 lb 15.9 oz (120.2 kg)    Ideal Body Weight:  61.36 kg  BMI:  Body mass index is 41.5 kg/m.  Estimated Nutritional Needs:   Kcal:  2165-2300  Protein:  105-120 grams  Fluid:  >/= 1.8 L/day    Elizabeth Mclean, RD, LDN, CDE Pager: [phone] After hours Pager: [phone]

## 2017-12-07 NOTE — Progress Notes (Signed)
Subjective:  No acute events overnight. Elizabeth Mclean seemed uncomfortable when seen this morning. Her abdominal pain is worsening and the PRN Fentanyl is not helping. She had a small bowel movement this morning after taking lactulose which is not normal for her as he usually has 2-3 BMs after taking it at home. She continues to have a poor appetite and endorses nausea. No vomiting. Discussed with patient we will continue further workup for etiology of abdominal pain with paracentesis and continue medical treatment with antibiotics and rifaximin as well as adjust pain medications. Discussed with patient she is not a candidate for surgical interventions and if she were to require surgical management she would be transferred to a different hospital. Patient verbalized understanding and is in agreement with plan. All questions answered.   Objective:  Vital signs in last 24 hours: Vitals:   12/06/17 1337 12/06/17 1650 12/06/17 2039 12/07/17 0454  BP: (!) 147/58 122/69 (!) 135/54 (!) 133/51  Pulse: 89 90 85 91  Resp: 18 18 19 20   Temp: 98 F (36.7 C) 98.4 F (36.9 C) 98.4 F (36.9 C) 98.2 F (36.8 C)  TempSrc: Axillary Oral Oral   SpO2: 100% 98% 98% 98%  Weight: 264 lb 15.9 oz (120.2 kg)     Height: 5\' 7"  (1.702 m)      Physical Exam  Constitutional: She is oriented to person, place, and time.  Obese female lying in bed. She appears uncomfortable secondary to abdominal pain, but is in no acute distress.   HENT:  Mouth/Throat: Oropharynx is clear and moist.  Cardiovascular: Normal rate, regular rhythm and normal heart sounds. Exam reveals no gallop and no friction rub.  No murmur heard. Pulmonary/Chest: Effort normal. No respiratory distress.  Abdominal:  There is fullness and tenderness on palpation of epigastric region, otherwise abdomen is soft. She has normoactive bowel sounds. Abdomen is obese and difficult to appreciate if distention is present.   Musculoskeletal: She exhibits edema  (1-2+ lower extremity pitting edema up to knees).  Neurological: She is alert and oriented to person, place, and time.    Assessment/Plan:  Principal Problem:   Abdominal pain Active Problems:   HYPERTENSION, BENIGN ESSENTIAL, CONTROLLED   Liver cirrhosis (HCC)   Hyperkalemia   AKI (acute kidney injury) ([place])  # Epigastric pain, concern for acalculous cholecystitis: Patient presents with a 1-day history of epigastric pain associated with poor PO intake. She had a similar episode in 09/2017 requiring admission for treatment of acute cholecystitis and improved medical management at that time. She follows up with Dr. Benson Norway with GI who we spoke to yesterday and recommended empiric coverage with Zosyn, IV albumin TID, and surgical consult for percutaneous drain. Surgery was consulted and recommended no invasive intervention given increased peri-op mortality in the setting of decompensated liver cirrhosis. Percutaneous drain would also not be helpful in the setting of ascites. If surgical management is needed she would need transfer to a tertiary care center. Unclear if this is acalculous cholecystitis. Can consider CT abdomen/pelvis to look for evidence of acalculous cholecystitis. Her RUQ [place] did show gallbladder thickening consistent with this diagnosis. Ideally would obtain HIDA scan for further evaluation. Unfortunately, there is a high chance of a false positive result given her advanced liver disease. Plan for diagnostic/therapeutic paracentesis to rule out SBP given worsening abdominal pain in a cirrhotic patient. She complains of worsening pain this morning and continues to be tender to palpation on epigastrium. No crepitus noted. Will continue medical management with  antibiotic therapy, IVF hydration, and pain control while awaiting for ascitic fluid studies.  - Continue Zosyn for empiric coverage of GNR and anaerobes  - Follow up diagnostic and therapeutic paracentesis - Consider CT abdomen/pelvis  for further evaluation as described above  - Continue 25% concentrated albumin 12.5 g TID  - Fentanyl 12.5 mg every 2 hours as needed for pain - Continue home rifaximin - Continue home lactulose 20 g TID with goal of 2-3 BM per day  - Restrict sodium to less than 2 g/day  # Decompensated hepatic cirrhosis secondary to chronic hepatitis C: Meld score of 30 on presentation. Sodium trending down 129->126 in the setting of holding home spironolactone due to hyperkalemia. Hgb and platelets at baseline. INR 2.1. TBili continues to increase 5.2-->7. Transaminases stable and at baseline. Will continue management as below.  - Continue home Lasix 80 mg daily - Hold home spironolactone - Continue home lactulose - Continue home ferrous sulfate - Management as above  # Hyperkalemia: in the setting of spironolactone use. Improving, K 5.1 this morning.  - Continue to hold spironolactone - Continue home Lasix and lactulose as above   # AKI: Baseline renal function unclear, appears to be worsening since 09/2017. Renal function  Suspect this pre-renal in etiology after holding spironolactone due to hyperkalemia.  - Continue to hold spironolactone. May resume tomorrow if potassium levels are improved.  - Continue home Lasix and lactulose as above   # Overactive bladder -Darifenacin (home Vesicare not in formulary)   Dispo: Anticipated discharge in approximately 2-3 day(s) pending improvement in GI symptoms and further workup (IR paracentesis).   Welford Roche, MD 12/07/2017, 8:48 AM Pager: [phone]

## 2017-12-07 NOTE — Plan of Care (Signed)
°  Problem: Elimination: °Goal: Will not experience complications related to bowel motility °Outcome: Progressing °  °Problem: Pain Managment: °Goal: General experience of comfort will improve °Outcome: Progressing °  °

## 2017-12-08 ENCOUNTER — Inpatient Hospital Stay (HOSPITAL_COMMUNITY): Payer: Medicaid Other

## 2017-12-08 DIAGNOSIS — D649 Anemia, unspecified: Secondary | ICD-10-CM | POA: Diagnosis present

## 2017-12-08 LAB — COMPREHENSIVE METABOLIC PANEL
ALT: 35 U/L (ref 14–54)
AST: 51 U/L — ABNORMAL HIGH (ref 15–41)
Albumin: 2.3 g/dL — ABNORMAL LOW (ref 3.5–5.0)
Alkaline Phosphatase: 45 U/L (ref 38–126)
Anion gap: 7 (ref 5–15)
BUN: 50 mg/dL — ABNORMAL HIGH (ref 6–20)
CO2: 21 mmol/L — ABNORMAL LOW (ref 22–32)
Calcium: 8.5 mg/dL — ABNORMAL LOW (ref 8.9–10.3)
Chloride: 101 mmol/L (ref 101–111)
Creatinine, Ser: 2.6 mg/dL — ABNORMAL HIGH (ref 0.44–1.00)
GFR calc Af Amer: 22 mL/min — ABNORMAL LOW (ref >60)
GFR calc non Af Amer: 19 mL/min — ABNORMAL LOW (ref >60)
Glucose, Bld: 120 mg/dL — ABNORMAL HIGH (ref 65–99)
Potassium: 5.1 mmol/L (ref 3.5–5.1)
Sodium: 129 mmol/L — ABNORMAL LOW (ref 135–145)
Total Bilirubin: 6.2 mg/dL — ABNORMAL HIGH (ref 0.3–1.2)
Total Protein: 5.9 g/dL — ABNORMAL LOW (ref 6.5–8.1)

## 2017-12-08 LAB — CBC
HCT: 20.5 % — ABNORMAL LOW (ref 36.0–46.0)
HCT: 20.5 % — ABNORMAL LOW (ref 36.0–46.0)
HCT: 22.7 % — ABNORMAL LOW (ref 36.0–46.0)
Hemoglobin: 7.5 g/dL — ABNORMAL LOW (ref 12.0–15.0)
Hemoglobin: 7.5 g/dL — ABNORMAL LOW (ref 12.0–15.0)
Hemoglobin: 8 g/dL — ABNORMAL LOW (ref 12.0–15.0)
MCH: 33 pg (ref 26.0–34.0)
MCH: 32.9 pg (ref 26.0–34.0)
MCH: 32.3 pg (ref 26.0–34.0)
MCHC: 36.6 g/dL — ABNORMAL HIGH (ref 30.0–36.0)
MCHC: 36.6 g/dL — ABNORMAL HIGH (ref 30.0–36.0)
MCHC: 35.2 g/dL (ref 30.0–36.0)
MCV: 90.3 fL (ref 78.0–100.0)
MCV: 89.9 fL (ref 78.0–100.0)
MCV: 91.5 fL (ref 78.0–100.0)
Platelets: 75 10*3/uL — ABNORMAL LOW (ref 150–400)
Platelets: 74 10*3/uL — ABNORMAL LOW (ref 150–400)
Platelets: 74 10*3/uL — ABNORMAL LOW (ref 150–400)
RBC: 2.27 MIL/uL — ABNORMAL LOW (ref 3.87–5.11)
RBC: 2.28 MIL/uL — ABNORMAL LOW (ref 3.87–5.11)
RBC: 2.48 MIL/uL — ABNORMAL LOW (ref 3.87–5.11)
RDW: 16 % — ABNORMAL HIGH (ref 11.5–15.5)
RDW: 15.9 % — ABNORMAL HIGH (ref 11.5–15.5)
RDW: 16.1 % — ABNORMAL HIGH (ref 11.5–15.5)
WBC: 11.5 10*3/uL — ABNORMAL HIGH (ref 4.0–10.5)
WBC: 11.5 10*3/uL — ABNORMAL HIGH (ref 4.0–10.5)
WBC: 11.7 10*3/uL — ABNORMAL HIGH (ref 4.0–10.5)

## 2017-12-08 LAB — OCCULT BLOOD X 1 CARD TO LAB, STOOL: Fecal Occult Bld: POSITIVE — AB

## 2017-12-08 MED ORDER — FUROSEMIDE 80 MG PO TABS
80.0000 mg | ORAL_TABLET | Freq: Two times a day (BID) | ORAL | Status: DC
  Administered 2017-12-08 – 2017-12-10 (×4): 80 mg via ORAL
  Filled 2017-12-08 (×4): qty 1

## 2017-12-08 MED ORDER — ONDANSETRON HCL 4 MG/2ML IJ SOLN
4.0000 mg | Freq: Once | INTRAMUSCULAR | Status: AC
  Administered 2017-12-08: 4 mg via INTRAVENOUS
  Filled 2017-12-08: qty 2

## 2017-12-08 NOTE — Progress Notes (Addendum)
   Subjective:  Patient endorses stable abd pain since having BMs. She doesn't think she's had melena or hematochezia. She endorses mild nausea w/o vomiting; denies fevers, chills. She endorses increased LE edema  Objective:  Vital signs in last 24 hours: Vitals:   12/07/17 0454 12/07/17 1400 12/07/17 2218 12/08/17 0612  BP: (!) 133/51 (!) 142/60 (!) 141/58 (!) 138/49  Pulse: 91 97 99 (!) 104  Resp: 20 17 17 19   Temp: 98.2 F (36.8 C) 98.2 F (36.8 C) 99.2 F (37.3 C) 98.7 F (37.1 C)  TempSrc:  Oral Oral   SpO2: 98% 99% 98% 100%  Weight:      Height:       Constitutional: In mild discomfort CV: RRR Resp: CTAB on anterior lung fields Abd: Distended, soft except for epigastric region; TTP of RUQ and epigastric regions, +BS Ext: 2+ pitting edema  Assessment/Plan:  Principal Problem:   Abdominal pain Active Problems:   HYPERTENSION, BENIGN ESSENTIAL, CONTROLLED   Liver cirrhosis (HCC)   Hyperkalemia   AKI (acute kidney injury) ([place])   Ascites  Epigastric pain, concern for acalculous cholecystitis:   CT abd w/o acute source. IR unable to obtain paracentesis due to inadequate pocket and body habitus. Abd pain improved with BM though still present. - Continue Zosyn for empiric coverage of GNR and anaerobes  - Continue 25% concentrated albumin 12.5 g TID  - Fentanyl 12.5 mg every 2 hours as needed for pain - Continue home rifaximin - Continue home lactulose 20 g TID with goal of 2-3 BM per day  - Restrict sodium to less than 2 g/day  Acute on chronic anemia: Hgb drop from 10 from 7.5 today, w/o s/sx of bleeding. CT abd w/o contrast was obtained w/o acute bleeding source. Could be to some degree dilutional in setting of volume overload.  - f/u FOBT, will get GI on board if positive as has h/o hepatic gastropathy - discussed with Dr. Loletha Carrow  Decompensated hepatic cirrhosis secondary to chronic hepatitis C: Meld score of 30 on presentation. Na improving to 129 today.  -  Lasix 80 mg BID for now - Restart spironolactone 100mg  daily - Continue home lactulose - Continue home ferrous sulfate - Management as above  Hyperkalemia:  Resolved - 5.1 this morning - Continue home Lasix - will resume spironolactone, and check Bmet this afternoon to ensure K is not elevated  AKI:  Baseline renal function unclear, appears to be worsening since 09/2017. Renal function suspect this pre-renal in etiology after holding spironolactone due to hyperkalemia. Worsening today with worsening volume overload. - Restart spironolactone 100mg  daily - Increase lasix to 80mg  BID - f/u Bmet at 5pm for hyperkalemia  Overactive bladder -Darifenacin (home Vesicare not in formulary)  Dispo: Anticipated discharge in approximately 2-3 day(s) pending improvement in GI symptoms and further workup (IR paracentesis).   Alphonzo Grieve, MD 12/08/2017, 10:18 AM Pager: [phone]

## 2017-12-09 DIAGNOSIS — R10812 Left upper quadrant abdominal tenderness: Secondary | ICD-10-CM

## 2017-12-09 DIAGNOSIS — R011 Cardiac murmur, unspecified: Secondary | ICD-10-CM

## 2017-12-09 LAB — CBC
HCT: 20.9 % — ABNORMAL LOW (ref 36.0–46.0)
Hemoglobin: 7.6 g/dL — ABNORMAL LOW (ref 12.0–15.0)
MCH: 32.6 pg (ref 26.0–34.0)
MCHC: 36.4 g/dL — ABNORMAL HIGH (ref 30.0–36.0)
MCV: 89.7 fL (ref 78.0–100.0)
Platelets: 67 10*3/uL — ABNORMAL LOW (ref 150–400)
RBC: 2.33 MIL/uL — ABNORMAL LOW (ref 3.87–5.11)
RDW: 16.1 % — ABNORMAL HIGH (ref 11.5–15.5)
WBC: 11.2 10*3/uL — ABNORMAL HIGH (ref 4.0–10.5)

## 2017-12-09 LAB — COMPREHENSIVE METABOLIC PANEL
ALT: 37 U/L (ref 14–54)
AST: 57 U/L — ABNORMAL HIGH (ref 15–41)
Albumin: 2.6 g/dL — ABNORMAL LOW (ref 3.5–5.0)
Alkaline Phosphatase: 46 U/L (ref 38–126)
Anion gap: 9 (ref 5–15)
BUN: 45 mg/dL — ABNORMAL HIGH (ref 6–20)
CO2: 18 mmol/L — ABNORMAL LOW (ref 22–32)
Calcium: 8.5 mg/dL — ABNORMAL LOW (ref 8.9–10.3)
Chloride: 101 mmol/L (ref 101–111)
Creatinine, Ser: 2.17 mg/dL — ABNORMAL HIGH (ref 0.44–1.00)
GFR calc Af Amer: 27 mL/min — ABNORMAL LOW (ref >60)
GFR calc non Af Amer: 23 mL/min — ABNORMAL LOW (ref >60)
Glucose, Bld: 127 mg/dL — ABNORMAL HIGH (ref 65–99)
Potassium: 4.6 mmol/L (ref 3.5–5.1)
Sodium: 128 mmol/L — ABNORMAL LOW (ref 135–145)
Total Bilirubin: 5.9 mg/dL — ABNORMAL HIGH (ref 0.3–1.2)
Total Protein: 5.9 g/dL — ABNORMAL LOW (ref 6.5–8.1)

## 2017-12-09 MED ORDER — ONDANSETRON HCL 4 MG/2ML IJ SOLN
4.0000 mg | Freq: Four times a day (QID) | INTRAMUSCULAR | Status: DC | PRN
  Administered 2017-12-09 – 2017-12-12 (×6): 4 mg via INTRAVENOUS
  Filled 2017-12-09 (×7): qty 2

## 2017-12-09 NOTE — Progress Notes (Signed)
Subjective:  No acute events overnight.  Patient continues to report abdominal pain and nausea that is relieved with antiemetics.  She reports having "a lot" of bowel movements yesterday, but unable to quantify them.  Continues to have decreased p.o. intake secondary to abdominal pain.  Discussed with patient GI has been consulted for further recommendations.  Patient verbalized understanding and is in agreement with plan.  Objective:  Vital signs in last 24 hours: Vitals:   12/08/17 0612 12/08/17 1426 12/08/17 2157 12/09/17 0458  BP: (!) 138/49 (!) 119/52 135/62 (!) 128/55  Pulse: (!) 104 92 88 85  Resp: 19 20 20 18   Temp: 98.7 F (37.1 C) 97.8 F (36.6 C) 100.1 F (37.8 C) 99.2 F (37.3 C)  TempSrc:  Oral Oral Oral  SpO2: 100% 100% 100% 98%  Weight:      Height:       Physical Exam  Constitutional: She is oriented to person, place, and time.  Obese, well-developed female lying in bed in no acute distress  Eyes: Scleral icterus is present.  Cardiovascular: Normal rate and regular rhythm. Exam reveals no gallop and no friction rub.  Murmur (II/VI systolic murmur best heard at USB) heard. Pulmonary/Chest: Effort normal. No respiratory distress.  Abdominal:  Abdominal fullness noted over the epigastrium.  Patient is exquisitely tender over LUQ and mildly tender over epigastrium.  Difficult to assess distention due to body habitus.  No tenderness over the lower quadrants.  Normoactive bowel sounds.  Musculoskeletal: She exhibits edema (2+ lower extremity pitting edema bilaterally).  Neurological: She is alert and oriented to person, place, and time.    Assessment/Plan:  Principal Problem:   Upper abdominal pain Active Problems:   HYPERTENSION, BENIGN ESSENTIAL, CONTROLLED   Liver cirrhosis (HCC)   Hyperkalemia   AKI (acute kidney injury) ([place])   Ascites   Acute on chronic anemia  # Epigastric pain, concern for acalculous cholecystitis: CT abd w/o acute source. IR  unable to obtain paracentesis due to inadequate pocket and body habitus. Abd pain improved with BM though still present. Today, she is exquisitely tender in epigastrium and LUQ. Unclear etiology. GI consulted. ] - GI consulted, appreciate assistance and recommendations  - Continue Zosyn for empiric coverage of GNR and anaerobes  - Continue 25% concentrated albumin 12.5 g TID  - Fentanyl 12.5mg every 2 hours as needed forpain - Continue home rifaximin - Continue home lactulose 20 g TID with goal of 2-3 BM per day  - Restrict sodium to less than 2 g/day  # Acute on chronic anemia: Hgb drop from 10 from 7.5 over the weekend. No signs/symptoms of bleeding. FOBT positive. She does have a history of hepatic gastropathy. CT abd w/o contrast was obtained w/o acute bleeding source. Could be to some degree dilutional in setting of volume overload.  - GI consulted, appreciate assistance and recommendations   # Decompensated hepatic cirrhosis secondary to chronic hepatitis C: Meld score of 30 on presentation. Na stable, 128.   - Lasix 80 mg BID for now - Restart spironolactone 100mg  daily - Continue home lactulose - Continue home ferrous sulfate - Management as above  # Hyperkalemia:Resolved  - Continue Lasix as below  - Resuming home meds as below   # AKI: Baseline renal function unclear, appears to be worsening since 09/2017. Renal function suspect this pre-renal in etiology after holding spironolactone due to hyperkalemia. Improving, Cr 2.1 today. Will continue to monitor.  - Restart spironolactone 100mg  daily today  - Continue lasix  to 80mg  BID  # Overactive bladder -Darifenacin(home Vesicare not in formulary)   Dispo: Anticipated discharge in approximately 2-3 day(s).   Welford Roche, MD 12/09/2017, 11:26 AM PRFFM:[phone]

## 2017-12-09 NOTE — Plan of Care (Signed)
?  Problem: Clinical Measurements: ?Goal: Ability to maintain clinical measurements within normal limits will improve ?Outcome: Progressing ?Goal: Will remain free from infection ?Outcome: Progressing ?Goal: Diagnostic test results will improve ?Outcome: Progressing ?  ?

## 2017-12-10 DIAGNOSIS — N183 Chronic kidney disease, stage 3 unspecified: Secondary | ICD-10-CM | POA: Diagnosis present

## 2017-12-10 DIAGNOSIS — R10811 Right upper quadrant abdominal tenderness: Secondary | ICD-10-CM

## 2017-12-10 LAB — COMPREHENSIVE METABOLIC PANEL
ALT: 44 U/L (ref 14–54)
AST: 72 U/L — ABNORMAL HIGH (ref 15–41)
Albumin: 2.9 g/dL — ABNORMAL LOW (ref 3.5–5.0)
Alkaline Phosphatase: 51 U/L (ref 38–126)
Anion gap: 9 (ref 5–15)
BUN: 40 mg/dL — ABNORMAL HIGH (ref 6–20)
CO2: 22 mmol/L (ref 22–32)
Calcium: 9 mg/dL (ref 8.9–10.3)
Chloride: 100 mmol/L — ABNORMAL LOW (ref 101–111)
Creatinine, Ser: 1.9 mg/dL — ABNORMAL HIGH (ref 0.44–1.00)
GFR calc Af Amer: 32 mL/min — ABNORMAL LOW (ref >60)
GFR calc non Af Amer: 27 mL/min — ABNORMAL LOW (ref >60)
Glucose, Bld: 119 mg/dL — ABNORMAL HIGH (ref 65–99)
Potassium: 4.3 mmol/L (ref 3.5–5.1)
Sodium: 131 mmol/L — ABNORMAL LOW (ref 135–145)
Total Bilirubin: 7.1 mg/dL — ABNORMAL HIGH (ref 0.3–1.2)
Total Protein: 6.6 g/dL (ref 6.5–8.1)

## 2017-12-10 LAB — CBC
HCT: 23.2 % — ABNORMAL LOW (ref 36.0–46.0)
Hemoglobin: 8.5 g/dL — ABNORMAL LOW (ref 12.0–15.0)
MCH: 32.7 pg (ref 26.0–34.0)
MCHC: 36.6 g/dL — ABNORMAL HIGH (ref 30.0–36.0)
MCV: 89.2 fL (ref 78.0–100.0)
Platelets: 75 10*3/uL — ABNORMAL LOW (ref 150–400)
RBC: 2.6 MIL/uL — ABNORMAL LOW (ref 3.87–5.11)
RDW: 16 % — ABNORMAL HIGH (ref 11.5–15.5)
WBC: 10.9 10*3/uL — ABNORMAL HIGH (ref 4.0–10.5)

## 2017-12-10 MED ORDER — METHYLPREDNISOLONE 4 MG PO TBPK
4.0000 mg | ORAL_TABLET | Freq: Four times a day (QID) | ORAL | Status: DC
  Administered 2017-12-12 (×4): 4 mg via ORAL

## 2017-12-10 MED ORDER — SPIRONOLACTONE 100 MG PO TABS
100.0000 mg | ORAL_TABLET | Freq: Every day | ORAL | Status: DC
  Administered 2017-12-10: 100 mg via ORAL
  Filled 2017-12-10: qty 1

## 2017-12-10 MED ORDER — METHYLPREDNISOLONE 4 MG PO TBPK
4.0000 mg | ORAL_TABLET | ORAL | Status: AC
  Administered 2017-12-10: 4 mg via ORAL

## 2017-12-10 MED ORDER — METHYLPREDNISOLONE 4 MG PO TBPK
8.0000 mg | ORAL_TABLET | Freq: Every morning | ORAL | Status: AC
  Administered 2017-12-10: 8 mg via ORAL
  Filled 2017-12-10: qty 21

## 2017-12-10 MED ORDER — FUROSEMIDE 80 MG PO TABS
80.0000 mg | ORAL_TABLET | Freq: Two times a day (BID) | ORAL | Status: DC
  Administered 2017-12-10: 80 mg via ORAL
  Filled 2017-12-10: qty 1

## 2017-12-10 MED ORDER — METHYLPREDNISOLONE 4 MG PO TBPK
4.0000 mg | ORAL_TABLET | Freq: Three times a day (TID) | ORAL | Status: AC
  Administered 2017-12-11 (×3): 4 mg via ORAL

## 2017-12-10 MED ORDER — METHYLPREDNISOLONE 4 MG PO TBPK
8.0000 mg | ORAL_TABLET | Freq: Every evening | ORAL | Status: AC
  Administered 2017-12-11: 8 mg via ORAL

## 2017-12-10 MED ORDER — METHYLPREDNISOLONE 4 MG PO TBPK
8.0000 mg | ORAL_TABLET | Freq: Every evening | ORAL | Status: AC
  Administered 2017-12-10: 8 mg via ORAL

## 2017-12-10 NOTE — Plan of Care (Signed)
  Problem: Clinical Measurements: Goal: Will remain free from infection Outcome: Progressing   Problem: Activity: Goal: Risk for activity intolerance will decrease Outcome: Progressing   Problem: Elimination: Goal: Will not experience complications related to bowel motility Outcome: Progressing

## 2017-12-10 NOTE — Consult Note (Signed)
Reason for Consult: Epigastric pain, cirrhosis, ascites Referring Physician: Kirby HPI: This is a 62 year old female with HCV cirrhosis well-known to me for her fluid overload admitted with complaints of epigastric pain.  In January this year she was hospitalized for a similar issue and there was thought that her gallbladder was the source, but she was a poor surgical candidate.  With antibiotics and conservative management her pain did eventually resolve.  As an outpatient she was managed for her fluid overload in the form of edema and ascites.  She was making progress until recently as her creatinine was increasing.  The lasix was decreased from 80 down to 40 mg and she was maintained on 100 mg of spironolactone.  This resulted in a 10 lbs weight gain and the spot urine sodium revealed retention of sodium.  Her lasix was increased again as her creatinine improved to 1.69, which was last week.  However, she started to have severe epigastric pain again and she presented to the ER.  Speaking with the resident this past Friday it was recommended that she undergo paracentesis, IV albumin infusion 12.5 g TID, hold spironolactone, and to continue with lasix.  The decision to hold spironolactone was a result of her creatinine at 1.9.  Spironolactone is not effective with a creatinine >1.8.  Past Medical History:  Diagnosis Date  . Anxiety   . Arthritis    "qwhere" (12/06/2017)  . Bilateral swelling of feet   . Bone spur of foot   . Breast cancer, left breast ([place]) 2011  . Chronic bronchitis ([place])   . Chronic lower back pain   . Depression   . GERD (gastroesophageal reflux disease)   . Hepatitis C   . Hypertension   . Insomnia   . Leg cramps   . Personal history of radiation therapy 2011   Left Breast Cancer  . Seasonal allergies    "take RX all year round" (12/06/2017)  . Type 2 diabetes, diet controlled ([place])     Past Surgical History:  Procedure Laterality Date   . BREAST BIOPSY Left 2011  . COLONOSCOPY    . DILATION AND CURETTAGE OF UTERUS    . epidural steroid injection    . ESOPHAGOGASTRODUODENOSCOPY (EGD) WITH PROPOFOL N/A 10/11/2017   Procedure: ESOPHAGOGASTRODUODENOSCOPY (EGD) WITH PROPOFOL;  Surgeon: Carol Ada, MD;  Location: [facility];  Service: Endoscopy;  Laterality: N/A;  . HYSTEROSCOPY W/D&C N/A 05/15/2016   Procedure: DILATATION AND CURETTAGE /HYSTEROSCOPY;  Surgeon: Lavonia Drafts, MD;  Location: [facility];  Service: Gynecology;  Laterality: N/A;  . IR PARACENTESIS  10/08/2017  . MASTECTOMY PARTIAL / LUMPECTOMY Left 08/2010   Archie Endo 08/24/2010  . MOUTH SURGERY Bilateral 2010?   "cheeks were filling up w/water; dr. biopsied it; fluid drained outt"  . TONSILLECTOMY  ~ 1979  . TRIGGER FINGER RELEASE Bilateral 2004   "thumbs"    Family History  Problem Relation Age of Onset  . Diabetes Mother   . Diabetes Father   . Hypertension Other     Social History:  reports that she quit smoking about 8 years ago. Her smoking use included cigarettes. She has a 20.00 pack-year smoking history. She has never used smokeless tobacco. She reports that she drank alcohol. She reports that she has current or past drug history. Drugs: Cocaine and Marijuana.  Allergies:  Allergies  Allergen Reactions  . Aspirin Other (See Comments)    Stomach aches   . Fexofenadine Other (See  Comments)    Causes leg and back pain  . Hydrocodone Itching    Medications:  Scheduled: . cholecalciferol  2,000 Units Oral Daily  . darifenacin  7.5 mg Oral Daily  . feeding supplement  1 Container Oral BID BM  . ferrous sulfate  325 mg Oral Q breakfast  . furosemide  80 mg Oral BID  . lactulose  20 g Oral TID  . megestrol  40 mg Oral BID  . methylPREDNISolone  4 mg Oral PC lunch  . methylPREDNISolone  4 mg Oral PC supper  . [START ON 12/11/2017] methylPREDNISolone  4 mg Oral 3 x daily with food  . [START ON 12/12/2017] methylPREDNISolone  4 mg Oral 4X  daily taper  . methylPREDNISolone  8 mg Oral Nightly  . [START ON 12/11/2017] methylPREDNISolone  8 mg Oral Nightly  . multivitamin with minerals  1 tablet Oral Daily  . rifaximin  550 mg Oral BID  . spironolactone  100 mg Oral Daily   Continuous: . albumin human    . piperacillin-tazobactam (ZOSYN)  IV 3.375 g (12/10/17 0943)    Results for orders placed or performed during the hospital encounter of 12/06/17 (from the past 24 hour(s))  CBC     Status: Abnormal   Collection Time: 12/10/17  5:04 AM  Result Value Ref Range   WBC 10.9 (H) 4.0 - 10.5 K/uL   RBC 2.60 (L) 3.87 - 5.11 MIL/uL   Hemoglobin 8.5 (L) 12.0 - 15.0 g/dL   HCT 23.2 (L) 36.0 - 46.0 %   MCV 89.2 78.0 - 100.0 fL   MCH 32.7 26.0 - 34.0 pg   MCHC 36.6 (H) 30.0 - 36.0 g/dL   RDW 16.0 (H) 11.5 - 15.5 %   Platelets 75 (L) 150 - 400 K/uL  Comprehensive metabolic panel     Status: Abnormal   Collection Time: 12/10/17  5:04 AM  Result Value Ref Range   Sodium 131 (L) 135 - 145 mmol/L   Potassium 4.3 3.5 - 5.1 mmol/L   Chloride 100 (L) 101 - 111 mmol/L   CO2 22 22 - 32 mmol/L   Glucose, Bld 119 (H) 65 - 99 mg/dL   BUN 40 (H) 6 - 20 mg/dL   Creatinine, Ser 1.90 (H) 0.44 - 1.00 mg/dL   Calcium 9.0 8.9 - 10.3 mg/dL   Total Protein 6.6 6.5 - 8.1 g/dL   Albumin 2.9 (L) 3.5 - 5.0 g/dL   AST 72 (H) 15 - 41 U/L   ALT 44 14 - 54 U/L   Alkaline Phosphatase 51 38 - 126 U/L   Total Bilirubin 7.1 (H) 0.3 - 1.2 mg/dL   GFR calc non Af Amer 27 (L) >60 mL/min   GFR calc Af Amer 32 (L) >60 mL/min   Anion gap 9 5 - 15     No results found.  ROS:  As stated above in the HPI otherwise negative.  Blood pressure (!) 138/56, pulse 79, temperature 98.6 F (37 C), temperature source Oral, resp. rate 20, height 5\' 7"  (1.702 m), weight 120.2 kg (264 lb 15.9 oz), last menstrual period 06/04/2017, SpO2 100 %.    PE: Gen: NAD, Alert and Oriented HEENT:  [place]/AT, EOMI Neck: Supple, no LAD Lungs: CTA Bilaterally CV: RRR without  M/G/R ABM: Soft, tender in the epigastrium/RUQ, + ascites, obese, +BS Ext: 4+ pitting edema  Assessment/Plan: 1) ? Musculoskeletal source of her upper abdominal pain. 2) ? Acalculous cholecystitis. 3) HCV cirrhosis. 4) Ascites. 5)  LE edema. 6) Renal insufficiency   She is a difficult patient to manage with regards to her fluids, electrolytes, and creatinine.  Her creatinine is improving with 80 mg of lasix.  Spironolactone was restarted, but her creatinine is at 1.9.  The abdominal examination did exhibit pain in the epigastric and RUQ region, however, it seemed to be from the lower ribs.  A trial of a Medrol Dose Pack will be started, but she is to remain on Zosyn.  Plan: 1) Trial of a Medrol Dose Pack. 2) Maintain Zosyn. 3) Stay on a 2 gram sodium diet.  NO NEED TO FLUID RESTRICT. 4) Continue with lasix. 5) Hold spironolactone for now until her creatinine improves too <1.8. 6) Continue with albumin infusion.  Azriel Jakob D 12/10/2017, 11:24 AM

## 2017-12-10 NOTE — Progress Notes (Signed)
   Subjective:  No acute events overnight. Patient was tearful this morning and stated "people don't understand when you're sick." She did not elaborate further. States her abdominal pain has improved as well as her appetite. Patient reported Dr. Benson Norway saw her this morning and added a new medication to her regimen. She is aware she is not a surgical candidate due to her advance liver cirrhosis.   Objective:  Vital signs in last 24 hours: Vitals:   12/09/17 0458 12/09/17 1410 12/09/17 2053 12/10/17 0519  BP: (!) 128/55 130/61 131/61 (!) 138/56  Pulse: 85 78 80 79  Resp: 18 20 20 20   Temp: 99.2 F (37.3 C) 98.1 F (36.7 C) 99.1 F (37.3 C) 98.6 F (37 C)  TempSrc: Oral Oral Oral Oral  SpO2: 98% 100% 100% 100%  Weight:      Height:       Physical Exam  Constitutional:  Chronically ill-appearing female in bed, tearful but in no acute distress  Cardiovascular: Normal rate and regular rhythm. Exam reveals no gallop and no friction rub.  Murmur (II/VI systolic ejection murmur) heard. Pulmonary/Chest: Effort normal. No respiratory distress.  Abdominal:  Abdomen appears more distended than yesterday.  Continues to be tender in upper quadrants but improved from yesterday.  Decreased bowel sounds.    Assessment/Plan:  Principal Problem:   Upper abdominal pain Active Problems:   HYPERTENSION, BENIGN ESSENTIAL, CONTROLLED   Liver cirrhosis (HCC)   Hyperkalemia   AKI (acute kidney injury) ([place])   Ascites   Acute on chronic anemia  # Epigastric pain, concern for acalculous cholecystitis:CT abd w/o acute source. IR unable to obtain paracentesis due to inadequate pocket and body habitus. Abdominal pain and appetite imrpoving. Unclear etiology. GI consulted. Awaiting Dr. Benson Norway, recommendations.  - GI consulted, appreciate assistance and recommendations - starting PO solumedrol  - Continue Zosyn for empiric coverage of GNR and anaerobes  - Continue 25% concentrated albumin 12.5 g TID  -  Fentanyl 12.5mg every 2 hours as needed forpain - Continue home rifaximin - Continue home lactulose 20 g TID with goal of 2-3 BM per day  - Restrict sodium to less than 2 g/day  # Acute on chronic anemia: Hgb drop from 10 from 7.5 over the weekend. No signs/symptoms of bleeding. FOBT positive. She does have a history of hepatic gastropathy. CT abd w/o contrast was obtained w/o acute bleeding source. Could be to some degree dilutional in setting of volume overload. Hgb improved to 8.5 this morning.   - GI consulted, appreciate assistance and recommendations   # Decompensated hepatic cirrhosis secondary to chronic hepatitis C: Meld score of 30 on presentation.Na stable, 131.   - Lasix 80 mgBID for now - Continue spironolactone 100mg  daily - Continue home lactulose - Continue home ferrous sulfate - Management as above  # Hyperkalemia:Resolved    # AKI: Baseline renal function unclear, appears to be worsening since 09/2017. Renal functionsuspect this pre-renal in etiology after holding spironolactone due to hyperkalemia.Improving, Cr 2.1 today. Will continue to monitor.  - Continue home spironolactone 100mg  daily today  - Continue lasix to 80mg  BID  # Overactive bladder -Darifenacin(home Vesicare not in formulary)   Dispo: Anticipated discharge in approximately 1-3 day(s).   Welford Roche, MD 12/10/2017, 10:22 AM Pager: [phone]

## 2017-12-11 DIAGNOSIS — N183 Chronic kidney disease, stage 3 (moderate): Secondary | ICD-10-CM

## 2017-12-11 DIAGNOSIS — K767 Hepatorenal syndrome: Secondary | ICD-10-CM

## 2017-12-11 LAB — COMPREHENSIVE METABOLIC PANEL
ALT: 43 U/L (ref 14–54)
AST: 73 U/L — ABNORMAL HIGH (ref 15–41)
Albumin: 3 g/dL — ABNORMAL LOW (ref 3.5–5.0)
Alkaline Phosphatase: 62 U/L (ref 38–126)
Anion gap: 9 (ref 5–15)
BUN: 39 mg/dL — ABNORMAL HIGH (ref 6–20)
CO2: 24 mmol/L (ref 22–32)
Calcium: 9 mg/dL (ref 8.9–10.3)
Chloride: 98 mmol/L — ABNORMAL LOW (ref 101–111)
Creatinine, Ser: 1.99 mg/dL — ABNORMAL HIGH (ref 0.44–1.00)
GFR calc Af Amer: 30 mL/min — ABNORMAL LOW (ref >60)
GFR calc non Af Amer: 26 mL/min — ABNORMAL LOW (ref >60)
Glucose, Bld: 126 mg/dL — ABNORMAL HIGH (ref 65–99)
Potassium: 4.4 mmol/L (ref 3.5–5.1)
Sodium: 131 mmol/L — ABNORMAL LOW (ref 135–145)
Total Bilirubin: 6.2 mg/dL — ABNORMAL HIGH (ref 0.3–1.2)
Total Protein: 6.1 g/dL — ABNORMAL LOW (ref 6.5–8.1)

## 2017-12-11 LAB — CBC
HCT: 22.5 % — ABNORMAL LOW (ref 36.0–46.0)
Hemoglobin: 8 g/dL — ABNORMAL LOW (ref 12.0–15.0)
MCH: 31.9 pg (ref 26.0–34.0)
MCHC: 35.6 g/dL (ref 30.0–36.0)
MCV: 89.6 fL (ref 78.0–100.0)
Platelets: 71 10*3/uL — ABNORMAL LOW (ref 150–400)
RBC: 2.51 MIL/uL — ABNORMAL LOW (ref 3.87–5.11)
RDW: 16.1 % — ABNORMAL HIGH (ref 11.5–15.5)
WBC: 11.6 10*3/uL — ABNORMAL HIGH (ref 4.0–10.5)

## 2017-12-11 MED ORDER — FUROSEMIDE 40 MG PO TABS
40.0000 mg | ORAL_TABLET | Freq: Two times a day (BID) | ORAL | Status: DC
  Administered 2017-12-11 (×2): 40 mg via ORAL
  Filled 2017-12-11 (×2): qty 1

## 2017-12-11 MED ORDER — BOOST / RESOURCE BREEZE PO LIQD CUSTOM
1.0000 | Freq: Three times a day (TID) | ORAL | Status: DC
  Administered 2017-12-11 – 2017-12-16 (×8): 1 via ORAL

## 2017-12-11 MED ORDER — PRO-STAT SUGAR FREE PO LIQD
30.0000 mL | Freq: Two times a day (BID) | ORAL | Status: DC
  Administered 2017-12-11 – 2017-12-16 (×7): 30 mL via ORAL
  Filled 2017-12-11 (×11): qty 30

## 2017-12-11 MED ORDER — WHITE PETROLATUM EX OINT
TOPICAL_OINTMENT | CUTANEOUS | Status: AC
  Administered 2017-12-11: 16:00:00
  Filled 2017-12-11: qty 28.35

## 2017-12-11 NOTE — Progress Notes (Addendum)
Subjective:  No acute events overnight.  Patient reports improvement in abdominal pain after starting p.o. Solu-Medrol.  Continues to endorse nausea and poor appetite.  Nausea is improved by IV medication.  Had one bowel movement this morning.  Patient verbalized understanding of her clinical picture and understands she will require several more days in the hospital to continue to improve. She is in agreement with plan.  All questions answered.   Objective:  Vital signs in last 24 hours: Vitals:   12/10/17 0519 12/10/17 1353 12/10/17 2229 12/11/17 0456  BP: (!) 138/56 140/61 134/60 (!) 118/55  Pulse: 79 81 84 80  Resp: 20 18 17 16   Temp: 98.6 F (37 C) 99.1 F (37.3 C) 99 F (37.2 C) 98.1 F (36.7 C)  TempSrc: Oral Oral Oral Oral  SpO2: 100% 100% 100% (!) 1%  Weight:      Height:       Physical Exam  Constitutional: She is oriented to person, place, and time.  Elderly female who appears chronically ill sitting up in bed in no acute distress  Eyes: Scleral icterus is present.  Cardiovascular: Normal rate, regular rhythm and normal heart sounds. Exam reveals no gallop and no friction rub.  No murmur heard. Pulmonary/Chest: Effort normal. No respiratory distress.  Abdominal:  Abdomen is obese and distended.  Nontender to palpation today.  Normoactive bowel sounds.  Musculoskeletal: She exhibits edema (1+ LE pitting edema bilaterally ).  Neurological: She is alert and oriented to person, place, and time.    Assessment/Plan:  Principal Problem:   Upper abdominal pain Active Problems:   HYPERTENSION, BENIGN ESSENTIAL, CONTROLLED   Liver cirrhosis (HCC)   Hyperkalemia   AKI (acute kidney injury) ([place])   Ascites   Acute on chronic anemia   CKD (chronic kidney disease) stage 3, GFR 30-59 ml/min (HCC)  #Epigastric pain, concern for acalculous cholecystitis:Patient reports her abdominal pain is much improved today after starting p.o. Solu-Medrol.  She continues to have  nausea that is resolved by IV nausea medication.  Also continues to endorse a poor appetite.  We will continue management as below. - GI consulted, appreciate assistance and recommendations - Continue PO solumedrol per GI recommendations  - Continue megace 40 mg BID  - Continue 25% concentrated albumin 12.5 g TID  - Fentanyl 12.5mg every 2 hours as needed forpain - Continue Zosyn for empiric coverage of GNR and anaerobes  - Continue home rifaximin and lactulose 20 g TID with goal of 2-3 BM per day  - Restrict sodium to less than 2 g/day  #Acute on chronic anemia: Hemoglobin at baseline on presentation and acutely dropped from 10-7.5 without signs/symptoms of bleeding.  FOBT positive.  She does have a history of hepatic gastropathy, but CT abdomen/pelvis without contrast did not show any acute bleeding source.  We will continue to monitor.  Hemoglobin currently stable at ~8.  - Continue home ferrous sulfate  #Decompensated hepatic cirrhosis secondary to chronic hepatitis C: complicated by ascites and hepatic encephalopathy in the past. No history of SBP. Meld score of 30 on presentation.Nastable at 131.AST/ALT 73/43  TBili 6.2  Plts 71.  - Lasix dose changed to 40 mg BID  - Spironolactone discontinued by GI in setting of poor renal function - Continue home lactulose 20 g TID   #Hepatorenal syndrome # CKD Stage III  Baseline renal function unclear, appears to be worsening since 09/2017.  Her renal function has slowly improved and remained stable today at 1.9. - Spironolactone discontinued  by GI in setting of poor renal function - Continue albumin infusion as above - Lasix dose changed to 40 mg BID   #Overactive bladder -Darifenacin(home Vesicare not in formulary)   Dispo: Anticipated discharge in approximately 2-3 day(s).   Welford Roche, MD 12/11/2017, 12:30 PM Pager: [phone]

## 2017-12-11 NOTE — Progress Notes (Signed)
Subjective: She believes that she is feeling better with the Medrol Dose Pack.  Objective: Vital signs in last 24 hours: Temp:  [98.1 F (36.7 C)-99.1 F (37.3 C)] 98.1 F (36.7 C) (03/27 0456) Pulse Rate:  [80-84] 80 (03/27 0456) Resp:  [16-18] 16 (03/27 0456) BP: (118-140)/(55-61) 118/55 (03/27 0456) SpO2:  [1 %-100 %] 1 % (03/27 0456) Last BM Date: 12/10/17  Intake/Output from previous day: 03/26 0701 - 03/27 0700 In: 220 [P.O.:120; IV Piggyback:100] Out: 3300 [Urine:3300] Intake/Output this shift: No intake/output data recorded.  General appearance: alert and no distress GI: soft, + ascites, less tender Extremities: 4+ edema  Lab Results: Recent Labs    12/09/17 0452 12/10/17 0504 12/11/17 0414  WBC 11.2* 10.9* 11.6*  HGB 7.6* 8.5* 8.0*  HCT 20.9* 23.2* 22.5*  PLT 67* 75* 71*   BMET Recent Labs    12/09/17 0452 12/10/17 0504 12/11/17 0414  NA 128* 131* 131*  K 4.6 4.3 4.4  CL 101 100* 98*  CO2 18* 22 24  GLUCOSE 127* 119* 126*  BUN 45* 40* 39*  CREATININE 2.17* 1.90* 1.99*  CALCIUM 8.5* 9.0 9.0   LFT Recent Labs    12/11/17 0414  PROT 6.1*  ALBUMIN 3.0*  AST 73*  ALT 43  ALKPHOS 62  BILITOT 6.2*   PT/INR No results for input(s): LABPROT, INR in the last 72 hours. Hepatitis Panel No results for input(s): HEPBSAG, HCVAB, HEPAIGM, HEPBIGM in the last 72 hours. C-Diff No results for input(s): CDIFFTOX in the last 72 hours. Fecal Lactopherrin No results for input(s): FECLLACTOFRN in the last 72 hours.  Studies/Results: No results found.  Medications:  Scheduled: . cholecalciferol  2,000 Units Oral Daily  . darifenacin  7.5 mg Oral Daily  . feeding supplement  1 Container Oral BID BM  . ferrous sulfate  325 mg Oral Q breakfast  . furosemide  40 mg Oral BID  . lactulose  20 g Oral TID  . megestrol  40 mg Oral BID  . methylPREDNISolone  4 mg Oral 3 x daily with food  . [START ON 12/12/2017] methylPREDNISolone  4 mg Oral 4X daily taper   . methylPREDNISolone  8 mg Oral Nightly  . multivitamin with minerals  1 tablet Oral Daily  . rifaximin  550 mg Oral BID   Continuous: . albumin human    . piperacillin-tazobactam (ZOSYN)  IV Stopped (12/11/17 0606)    Assessment/Plan: 1) ? Rib pain.  She reports improvement. 2) ? Acalculous cholecystitis. 3) Renal insufficiency. 4) Decompensated HCV cirrhosis.   The patient appears to be improving with her pain.  Her creatinine is increasing and she had 3.3 liters of urine output.  She was supposed to be on 80 mg of lasix total, but she was on BID.  Plan: 1) Lasix 40 mg BID. 2) Continue with albumin. 3) Continue to hold spironolactone. 4) Continue with the Medrol Dose Pack.  LOS: 5 days   Takeira Yanes D 12/11/2017, 7:20 AM

## 2017-12-11 NOTE — Progress Notes (Signed)
Nutrition Follow-up  DOCUMENTATION CODES:   Obesity unspecified  INTERVENTION:   -Increase Boost Breeze po to TID, each supplement provides 250 kcal and 9 grams of protein -30 ml Prostat BID, each supplement provides 100 kcals and 15 grams protein -Continue MVI daily  NUTRITION DIAGNOSIS:   Inadequate oral intake related to acute illness, chronic illness, early satiety, decreased appetite as evidenced by per patient/family report.  Ongoing  GOAL:   Patient will meet greater than or equal to 90% of their needs  Unmet  MONITOR:   PO intake, Supplement acceptance, Weight trends, Labs  REASON FOR ASSESSMENT:   Consult Assessment of nutrition requirement/status  ASSESSMENT:   62 year old with history of diabetes, hypertension, hepatitis C with cirrhosis and comes in complaining of upper abdominal pain which started yesterday morning and is getting worse. There is associated nausea but no vomiting. She has chronic diarrhea because she takes lactulose, and this has been unchanged. Pain is not affected by bowel movement. She rates pain a 10/10.  3/22- Megace started 3/28- prednisone started  Reviewed I/O's: -3.8 L x 24 hours and -4.9 L since admission.   Pt sleeping soundly at time of visit; did not awaken to voice.   Pt continues with epigastric pain. GI following and added steroids, which has allowed pt to feel better. Intake remains poor; noted meal completion 0-25%. Pt is consuming Boost Breeze supplements per MAR.   Labs reviewed: Na: 131.   Diet Order:  Diet 2 gram sodium Room service appropriate? Yes; Fluid consistency: Thin  EDUCATION NEEDS:   No education needs have been identified at this time  Skin:  Skin Assessment: Reviewed RN Assessment  Last BM:  12/04/17  Height:   Ht Readings from Last 1 Encounters:  12/06/17 5\' 7"  (1.702 m)    Weight:   Wt Readings from Last 1 Encounters:  12/06/17 264 lb 15.9 oz (120.2 kg)    Ideal Body Weight:  61.36  kg  BMI:  Body mass index is 41.5 kg/m.  Estimated Nutritional Needs:   Kcal:  2165-2300  Protein:  105-120 grams  Fluid:  >/= 1.8 L/day    Malak Orantes A. Jimmye Norman, RD, LDN, CDE Pager: [phone] After hours Pager: [phone]

## 2017-12-12 DIAGNOSIS — R1010 Upper abdominal pain, unspecified: Secondary | ICD-10-CM

## 2017-12-12 LAB — COMPREHENSIVE METABOLIC PANEL
ALT: 41 U/L (ref 14–54)
AST: 70 U/L — ABNORMAL HIGH (ref 15–41)
Albumin: 3 g/dL — ABNORMAL LOW (ref 3.5–5.0)
Alkaline Phosphatase: 64 U/L (ref 38–126)
Anion gap: 9 (ref 5–15)
BUN: 44 mg/dL — ABNORMAL HIGH (ref 6–20)
CO2: 23 mmol/L (ref 22–32)
Calcium: 8.5 mg/dL — ABNORMAL LOW (ref 8.9–10.3)
Chloride: 99 mmol/L — ABNORMAL LOW (ref 101–111)
Creatinine, Ser: 2 mg/dL — ABNORMAL HIGH (ref 0.44–1.00)
GFR calc Af Amer: 30 mL/min — ABNORMAL LOW (ref >60)
GFR calc non Af Amer: 26 mL/min — ABNORMAL LOW (ref >60)
Glucose, Bld: 141 mg/dL — ABNORMAL HIGH (ref 65–99)
Potassium: 4.6 mmol/L (ref 3.5–5.1)
Sodium: 131 mmol/L — ABNORMAL LOW (ref 135–145)
Total Bilirubin: 5.1 mg/dL — ABNORMAL HIGH (ref 0.3–1.2)
Total Protein: 5.9 g/dL — ABNORMAL LOW (ref 6.5–8.1)

## 2017-12-12 LAB — CBC
HCT: 22.2 % — ABNORMAL LOW (ref 36.0–46.0)
Hemoglobin: 8 g/dL — ABNORMAL LOW (ref 12.0–15.0)
MCH: 32.7 pg (ref 26.0–34.0)
MCHC: 36 g/dL (ref 30.0–36.0)
MCV: 90.6 fL (ref 78.0–100.0)
Platelets: 70 10*3/uL — ABNORMAL LOW (ref 150–400)
RBC: 2.45 MIL/uL — ABNORMAL LOW (ref 3.87–5.11)
RDW: 16.2 % — ABNORMAL HIGH (ref 11.5–15.5)
WBC: 13.3 10*3/uL — ABNORMAL HIGH (ref 4.0–10.5)

## 2017-12-12 MED ORDER — SODIUM CHLORIDE 0.9% FLUSH
10.0000 mL | INTRAVENOUS | Status: DC | PRN
  Administered 2017-12-15: 10 mL via INTRAVENOUS
  Filled 2017-12-12: qty 10

## 2017-12-12 MED ORDER — FUROSEMIDE 40 MG PO TABS
40.0000 mg | ORAL_TABLET | Freq: Every day | ORAL | Status: DC
  Administered 2017-12-12 – 2017-12-16 (×5): 40 mg via ORAL
  Filled 2017-12-12 (×6): qty 1

## 2017-12-12 NOTE — Care Management Note (Addendum)
Case Management Note  Patient Details  Name: Elizabeth Mclean MRN: [id number] Date of Birth: 1956/08/05  Subjective/Objective: Pt admitted on 12/06/17 with upper abdominal pain, hepatic cirrhosis, and hepatorenal syndrome.  PCP is Juluis Mire.  PTA, pt independent with assistive device.            Action/Plan: OT consult pending.  PT recommending [place] follow up.  Will follow for recommendations.    Expected Discharge Date:                  Expected Discharge Plan:     In-House Referral:     Discharge planning Services  CM Consult  Post Acute Care Choice:    Choice offered to:     DME Arranged:    DME Agency:     HH Arranged:    HH Agency:     Status of Service:  In process, will continue to follow  If discussed at Long Length of Stay Meetings, dates discussed:    Additional Comments:  Ella Bodo, RN 12/12/2017, 5:13 PM

## 2017-12-12 NOTE — Progress Notes (Signed)
Subjective: Rib pain appears to be improved.  Objective: Vital signs in last 24 hours: Temp:  [98.7 F (37.1 C)-99.1 F (37.3 C)] 98.8 F (37.1 C) (03/28 0531) Pulse Rate:  [76-81] 76 (03/28 0531) Resp:  [16-17] 16 (03/28 0531) BP: (118-126)/(56-60) 125/56 (03/28 0531) SpO2:  [96 %-100 %] 96 % (03/28 0531) Last BM Date: 12/10/17  Intake/Output from previous day: 03/27 0701 - 03/28 0700 In: 955 [P.O.:655; IV Piggyback:300] Out: -  Intake/Output this shift: No intake/output data recorded.  General appearance: alert and no distress GI: tender in the inferior costal margins, but improved, + ascites  Lab Results: Recent Labs    12/10/17 0504 12/11/17 0414 12/12/17 0410  WBC 10.9* 11.6* 13.3*  HGB 8.5* 8.0* 8.0*  HCT 23.2* 22.5* 22.2*  PLT 75* 71* 70*   BMET Recent Labs    12/10/17 0504 12/11/17 0414 12/12/17 0410  NA 131* 131* 131*  K 4.3 4.4 4.6  CL 100* 98* 99*  CO2 22 24 23   GLUCOSE 119* 126* 141*  BUN 40* 39* 44*  CREATININE 1.90* 1.99* 2.00*  CALCIUM 9.0 9.0 8.5*   LFT Recent Labs    12/12/17 0410  PROT 5.9*  ALBUMIN 3.0*  AST 70*  ALT 41  ALKPHOS 64  BILITOT 5.1*   PT/INR No results for input(s): LABPROT, INR in the last 72 hours. Hepatitis Panel No results for input(s): HEPBSAG, HCVAB, HEPAIGM, HEPBIGM in the last 72 hours. C-Diff No results for input(s): CDIFFTOX in the last 72 hours. Fecal Lactopherrin No results for input(s): FECLLACTOFRN in the last 72 hours.  Studies/Results: No results found.  Medications:  Scheduled: . cholecalciferol  2,000 Units Oral Daily  . darifenacin  7.5 mg Oral Daily  . feeding supplement  1 Container Oral TID BM  . feeding supplement (PRO-STAT SUGAR FREE 64)  30 mL Oral BID  . ferrous sulfate  325 mg Oral Q breakfast  . furosemide  40 mg Oral BID  . lactulose  20 g Oral TID  . megestrol  40 mg Oral BID  . methylPREDNISolone  4 mg Oral 4X daily taper  . multivitamin with minerals  1 tablet Oral  Daily  . rifaximin  550 mg Oral BID   Continuous: . albumin human    . piperacillin-tazobactam (ZOSYN)  IV 3.375 g (12/12/17 0300)    Assessment/Plan: 1) Rib pain. 2) Renal insufficiency. 3) Decompensated cirrhosis.   Her creatinine has increased up to 2.0 with 40 mg lasix BID.  Her other electrolytes are relatively stable.  The lower extremity edema appears to be decreasing as it is not as tense with palpation today.  She continues to improve with the Medrol Dose Pack.  Plan: 1) Drop lasix to 40 mg QD. 2) Monitor creatinine. 3) Continue with albumin. 4) Discontinue Zosyn as it does not seem that she has any real benefit at this time.  LOS: 6 days   Graclyn Lawther D 12/12/2017, 7:21 AM

## 2017-12-12 NOTE — Progress Notes (Signed)
   Subjective:  No acute events overnight.  Patient initially tearful and expresses concern about being a burden to her family.  Reassured patient.  She is otherwise feeling much better than on presentation.  Denies abdominal pain today.  Continues to report nausea but tolerating p.o. intake fairly well.  Continues to have bowel movements after receiving lactulose.  Encourage ambulation.   Objective:  Vital signs in last 24 hours: Vitals:   12/11/17 0456 12/11/17 1421 12/11/17 2231 12/12/17 0531  BP: (!) 118/55 118/60 (!) 126/56 (!) 125/56  Pulse: 80 81 78 76  Resp: 16  17 16   Temp: 98.1 F (36.7 C) 99.1 F (37.3 C) 98.7 F (37.1 C) 98.8 F (37.1 C)  TempSrc: Oral Oral Oral Oral  SpO2: (!) 1% 99% 100% 96%  Weight:      Height:       Physical Exam  Constitutional: She is oriented to person, place, and time.  Obese elderly female sitting up in chair in no acute distress.  Cardiovascular: Normal rate, regular rhythm and normal heart sounds. Exam reveals no gallop and no friction rub.  No murmur heard. Pulmonary/Chest: Effort normal. No respiratory distress.  Abdominal:  Abdomen is obese and distended.  No tenderness to palpation today.  Normoactive bowel sounds.  Musculoskeletal: She exhibits edema (1-2+ lower extremity pitting edema up to knees bilaterally).  Neurological: She is alert and oriented to person, place, and time.     Assessment/Plan:  Principal Problem:   Upper abdominal pain Active Problems:   HYPERTENSION, BENIGN ESSENTIAL, CONTROLLED   Liver cirrhosis (HCC)   Hyperkalemia   AKI (acute kidney injury) ([place])   Ascites   Acute on chronic anemia   CKD (chronic kidney disease) stage 3, GFR 30-59 ml/min (HCC)  #Upper abdominal pain, concern for acalculous cholecystitis:Patient continues to improve.  Denies abdominal pain today and reports requiring decreasing amounts of pain medication.  Continues to report nausea but tolerating p.o. intake. We will continue  management as below. 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 No history of SBP. Meld score of 30 on presentation.Nastable at 131.LFTs stable. TBili 5.1 and  Plts 71.  - Lasix dose changed 40 mg BID --> 40 mg QD - Spironolactone discontinued by GI in setting of poor renal function - Continue home lactulose 20 g TID   #Hepatorenal syndrome # CKD Stage III  Baseline renal function unclear, appears to be worsening since 09/2017.  Her renal function has slowly improved and has remained stable ~1.9-2 for the past 3 days. - Spironolactone discontinued by GI in setting of poor renal function - Continue albumin infusion as above - Lasix dose changed 40 mg BID --> 40 mg QD   #Overactive bladder -Darifenacin(home Vesicare not in formulary)   Dispo: Anticipated discharge in approximately 1-3 day(s).   Welford Roche, MD 12/12/2017, 7:30 AM Pager: [phone]

## 2017-12-13 ENCOUNTER — Inpatient Hospital Stay (HOSPITAL_COMMUNITY): Payer: Medicaid Other

## 2017-12-13 DIAGNOSIS — R935 Abnormal findings on diagnostic imaging of other abdominal regions, including retroperitoneum: Secondary | ICD-10-CM

## 2017-12-13 LAB — COMPREHENSIVE METABOLIC PANEL
ALBUMIN: 3.1 g/dL — AB (ref 3.5–5.0)
ALT: 50 U/L (ref 14–54)
AST: 105 U/L — ABNORMAL HIGH (ref 15–41)
Alkaline Phosphatase: 71 U/L (ref 38–126)
Anion gap: 11 (ref 5–15)
BILIRUBIN TOTAL: 5.1 mg/dL — AB (ref 0.3–1.2)
BUN: 49 mg/dL — AB (ref 6–20)
CALCIUM: 8.6 mg/dL — AB (ref 8.9–10.3)
CO2: 22 mmol/L (ref 22–32)
CREATININE: 2 mg/dL — AB (ref 0.44–1.00)
Chloride: 97 mmol/L — ABNORMAL LOW (ref 101–111)
GFR calc Af Amer: 30 mL/min — ABNORMAL LOW (ref >60)
GFR calc non Af Amer: 26 mL/min — ABNORMAL LOW (ref >60)
Glucose, Bld: 144 mg/dL — ABNORMAL HIGH (ref 65–99)
Potassium: 4.7 mmol/L (ref 3.5–5.1)
Sodium: 130 mmol/L — ABNORMAL LOW (ref 135–145)
Total Protein: 6 g/dL — ABNORMAL LOW (ref 6.5–8.1)

## 2017-12-13 LAB — CBC
HEMATOCRIT: 22.8 % — AB (ref 36.0–46.0)
Hemoglobin: 8.4 g/dL — ABNORMAL LOW (ref 12.0–15.0)
MCH: 33.6 pg (ref 26.0–34.0)
MCHC: 36.8 g/dL — AB (ref 30.0–36.0)
MCV: 91.2 fL (ref 78.0–100.0)
PLATELETS: 71 10*3/uL — AB (ref 150–400)
RBC: 2.5 MIL/uL — ABNORMAL LOW (ref 3.87–5.11)
RDW: 16.9 % — AB (ref 11.5–15.5)
WBC: 15.2 10*3/uL — AB (ref 4.0–10.5)

## 2017-12-13 MED ORDER — ALUM & MAG HYDROXIDE-SIMETH 200-200-20 MG/5ML PO SUSP
30.0000 mL | ORAL | Status: DC | PRN
  Administered 2017-12-13: 30 mL via ORAL
  Filled 2017-12-13 (×2): qty 30

## 2017-12-13 MED ORDER — PREDNISONE 20 MG PO TABS
20.0000 mg | ORAL_TABLET | Freq: Every day | ORAL | Status: DC
  Administered 2017-12-13 – 2017-12-16 (×4): 20 mg via ORAL
  Filled 2017-12-13 (×4): qty 1

## 2017-12-13 NOTE — Evaluation (Signed)
Occupational Therapy Evaluation Patient Details Name: Elizabeth Mclean MRN: [id number] DOB: Apr 04, 1956 Today's Date: 12/13/2017    History of Present Illness 62yo female with RUQ abdominal pain, diagnosed with acalculous cholecystitis. PMH OA, foot bone spur, hx breast CA, chronic pain, DM, hepatitis C, HTN    Clinical Impression   Pt was very recently independent, going to water aerobics classes at the [facility]. Pt has recently had a big change in independence and has been getting around her house in computer chairs, and has no energy to perform meaningful occupations. Pt is currently at supervision level for ADL within hospital room, but would really benefit from skilled OT in the home health setting to see how Pt is operating in her home environment and advise on energy conservation techniques as well as home safety eval in addition to a shower chair for safety while bathing. Acutely, OT will follow for energy conservation educuation as well as AE education for LB ADL.     Follow Up Recommendations  Home health OT;Supervision - Intermittent    Equipment Recommendations  Tub/shower seat;Other (comment)(AE kit for LB ADL)    Recommendations for Other Services       Precautions / Restrictions Precautions Precautions: None Restrictions Weight Bearing Restrictions: No      Mobility Bed Mobility               General bed mobility comments: Pt sitting OOB in recliner when OT arrived  Transfers Overall transfer level: Needs assistance Equipment used: None Transfers: Sit to/from Stand Sit to Stand: Supervision              Balance Overall balance assessment: Needs assistance Sitting-balance support: No upper extremity supported;Feet supported Sitting balance-Leahy Scale: Good     Standing balance support: Single extremity supported;During functional activity Standing balance-Leahy Scale: Fair Standing balance comment: furniture walked this session with OT                            ADL either performed or assessed with clinical judgement   ADL Overall ADL's : Needs assistance/impaired                                       General ADL Comments: Pt overall supervision for ADL - Pt needs education in energy conservation and maybe AE for LB dressing (as a way to conserve energy)     Vision Baseline Vision/History: Wears glasses Wears Glasses: At all times Patient Visual Report: No change from baseline Vision Assessment?: No apparent visual deficits     Perception     Praxis      Pertinent Vitals/Pain Pain Assessment: No/denies pain     Hand Dominance Right   Extremity/Trunk Assessment Upper Extremity Assessment Upper Extremity Assessment: Overall WFL for tasks assessed   Lower Extremity Assessment Lower Extremity Assessment: Defer to PT evaluation   Cervical / Trunk Assessment Cervical / Trunk Assessment: Normal   Communication Communication Communication: No difficulties   Cognition Arousal/Alertness: Awake/alert Behavior During Therapy: WFL for tasks assessed/performed Overall Cognitive Status: Within Functional Limits for tasks assessed                                     General Comments       Exercises  Shoulder Instructions      Home Living Family/patient expects to be discharged to:: Private residence Living Arrangements: Non-relatives/Friends Available Help at Discharge: Friend(s);Available PRN/intermittently Type of Home: Apartment Home Access: Level entry     Home Layout: One level     Bathroom Shower/Tub: Teacher, early years/pre: Standard     Home Equipment: Cane - single point;Bedside commode          Prior Functioning/Environment Level of Independence: Independent with assistive device(s)        Comments: uses SPC for mobility but scoots around in computer chairs at home; has children that check on her and can help her intermittently;  reports frustration regarding her feeling of losing independence         OT Problem List: Decreased strength;Decreased activity tolerance;Decreased knowledge of use of DME or AE      OT Treatment/Interventions: Energy conservation;DME and/or AE instruction;Patient/family education    OT Goals(Current goals can be found in the care plan section) Acute Rehab OT Goals Patient Stated Goal: to regain independence  OT Goal Formulation: With patient Time For Goal Achievement: 12/27/17 Potential to Achieve Goals: Good ADL Goals Pt Will Perform Tub/Shower Transfer: Tub transfer;ambulating;shower seat Additional ADL Goal #1: Pt will recall 3 ways of conserving energy for ADL/ADL with 2 or less verbal cues  OT Frequency: Min 2X/week   Barriers to D/C:            Co-evaluation              AM-PAC PT "6 Clicks" Daily Activity     Outcome Measure Help from another person eating meals?: None Help from another person taking care of personal grooming?: None Help from another person toileting, which includes using toliet, bedpan, or urinal?: A Little Help from another person bathing (including washing, rinsing, drying)?: A Little Help from another person to put on and taking off regular upper body clothing?: None Help from another person to put on and taking off regular lower body clothing?: A Little 6 Click Score: 21   End of Session Equipment Utilized During Treatment: Gait belt Nurse Communication: Mobility status;Other (comment)(taking in a pitcher of water (strict I&O))  Activity Tolerance: Patient tolerated treatment well Patient left: in chair;with call bell/phone within reach  OT Visit Diagnosis: Muscle weakness (generalized) (M62.81)                Time: 0211-1552 OT Time Calculation (min): 30 min Charges:  OT General Charges $OT Visit: 1 Visit OT Evaluation $OT Eval Low Complexity: 1 Low OT Treatments $Self Care/Home Management : 8-22 mins G-Codes:     Hulda Humphrey OTR/L [place] 12/13/2017, 5:15 PM

## 2017-12-13 NOTE — Evaluation (Signed)
Physical Therapy Evaluation Patient Details Name: Elizabeth Mclean MRN: [id number] DOB: Sep 07, 1956 Today's Date: 12/13/2017   History of Present Illness  62yo female with RUQ abdominal pain, diagnosed with acalculous cholecystitis. PMH OA, foot bone spur, hx breast CA, chronic pain, DM, hepatitis C, HTN   Clinical Impression   Patient received in bed, reports she had a very bad night last night and is not feeling good but willing to work with PT. She is able to complete bed mobility with Mod(I) and requires only S for functional transfers and gait in her room; limited gait distance today due to patient report of general malaise and fatigue. She became very emotional and tearful today, reporting she feels like she is losing her independence, which is important for her to maintain. She was left in bed with all needs met this morning. She will continue to benefit from ongoing skilled PT services in the acute setting, and will also benefit from skilled HHPT services to address functional deficits moving forward.     Follow Up Recommendations Home health PT    Equipment Recommendations  None recommended by PT(appears to have all necessary DME )    Recommendations for Other Services       Precautions / Restrictions Precautions Precautions: None Restrictions Weight Bearing Restrictions: No      Mobility  Bed Mobility Overal bed mobility: Modified Independent                Transfers Overall transfer level: Needs assistance Equipment used: None Transfers: Sit to/from Stand Sit to Stand: Supervision         General transfer comment: S for safety   Ambulation/Gait Ambulation/Gait assistance: Supervision Ambulation Distance (Feet): 30 Feet Assistive device: 1 person hand held assist(pushed IV pole ) Gait Pattern/deviations: Step-through pattern;Decreased step length - right;Decreased step length - left;Trendelenburg     General Gait Details: used IV pole as patient is  accustomed to using SPC; able to ambulate in room with S with U UE support. Limited gait distance to in room today per patient request due to "having a bad night/day" and general malaise   Stairs            Wheelchair Mobility    Modified Rankin (Stroke Patients Only)       Balance Overall balance assessment: Needs assistance Sitting-balance support: Bilateral upper extremity supported;Feet supported Sitting balance-Leahy Scale: Good     Standing balance support: Single extremity supported;During functional activity Standing balance-Leahy Scale: Fair                               Pertinent Vitals/Pain Pain Assessment: No/denies pain    Home Living Family/patient expects to be discharged to:: Private residence Living Arrangements: Non-relatives/Friends Available Help at Discharge: Friend(s);Available PRN/intermittently Type of Home: Apartment Home Access: Level entry     Home Layout: One level Home Equipment: Cane - single point;Bedside commode      Prior Function Level of Independence: Independent with assistive device(s)         Comments: uses SPC for mobility but scoots around in computer chairs at home; has children that check on her and can help her intermittentyl; reports frustration regarding her feeling of losing independence      Hand Dominance        Extremity/Trunk Assessment   Upper Extremity Assessment Upper Extremity Assessment: Defer to OT evaluation    Lower Extremity Assessment Lower Extremity Assessment: Generalized  weakness    Cervical / Trunk Assessment Cervical / Trunk Assessment: Normal  Communication   Communication: No difficulties  Cognition Arousal/Alertness: Awake/alert Behavior During Therapy: WFL for tasks assessed/performed Overall Cognitive Status: Within Functional Limits for tasks assessed                                        General Comments General comments (skin integrity,  edema, etc.): patient became very tearful and emotional and states she is frustrated that she feels like she is slowly losing her independence     Exercises     Assessment/Plan    PT Assessment Patient needs continued PT services  PT Problem List Decreased strength;Decreased mobility;Decreased coordination;Decreased activity tolerance;Decreased balance       PT Treatment Interventions DME instruction;Therapeutic activities;Gait training;Therapeutic exercise;Patient/family education;Stair training;Balance training;Functional mobility training;Neuromuscular re-education    PT Goals (Current goals can be found in the Care Plan section)  Acute Rehab PT Goals Patient Stated Goal: to regain independence  PT Goal Formulation: With patient Time For Goal Achievement: 12/27/17 Potential to Achieve Goals: Good    Frequency Min 3X/week   Barriers to discharge        Co-evaluation               AM-PAC PT "6 Clicks" Daily Activity  Outcome Measure Difficulty turning over in bed (including adjusting bedclothes, sheets and blankets)?: None Difficulty moving from lying on back to sitting on the side of the bed? : None Difficulty sitting down on and standing up from a chair with arms (e.g., wheelchair, bedside commode, etc,.)?: None Help needed moving to and from a bed to chair (including a wheelchair)?: A Little Help needed walking in hospital room?: A Little Help needed climbing 3-5 steps with a railing? : A Lot 6 Click Score: 20    End of Session   Activity Tolerance: Patient tolerated treatment well Patient left: in bed;with call bell/phone within reach   PT Visit Diagnosis: Unsteadiness on feet (R26.81);Muscle weakness (generalized) (M62.81);Difficulty in walking, not elsewhere classified (R26.2)    Time: 1040-1106 PT Time Calculation (min) (ACUTE ONLY): 26 min   Charges:   PT Evaluation $PT Eval Low Complexity: 1 Low PT Treatments $Gait Training: 8-22 mins   PT G  Codes:        Deniece Ree PT, DPT, CBIS  Supplemental Physical Therapist [place]   Pager [phone]

## 2017-12-13 NOTE — Progress Notes (Addendum)
Subjective:  No acute events overnight. Patient states she had a rough night due to abdominal pain. Denies nausea and vomiting. Metallic object noted on CT from 3/24. Patient states she had dental work 1 month ago, but she is unsure if she might have swallow a metallic object. Discussed with patient we will discuss with Dr. Benson Norway. Patient aware Dr. Benson Norway recommended nephrology consult due to lack of improvement in kidney function.   Objective:  Vital signs in last 24 hours: Vitals:   12/12/17 0815 12/12/17 1513 12/12/17 2141 12/13/17 0532  BP:  (!) 137/58 (!) 134/53 (!) 134/52  Pulse:  78 77 79  Resp:  16 17 18   Temp:  97.8 F (36.6 C) 98.3 F (36.8 C) 98.2 F (36.8 C)  TempSrc:  Oral Oral   SpO2:  100% 100% 98%  Weight: 257 lb 4.4 oz (116.7 kg)   262 lb 2 oz (118.9 kg)  Height:       Physical Exam  Constitutional: She is oriented to person, place, and time.  Chronically ill appearing female, sleeping in bed in no acute distress   Cardiovascular: Normal rate and regular rhythm. Exam reveals no gallop and no friction rub.  Murmur (II/VI sysotlic murmur best heard at L USB) heard. Pulmonary/Chest: Effort normal. No respiratory distress.  Abdominal:  Abdomen is more distended than yesterday. She is tender to palpation over epigastrium, though no tenderness when distracted. Normoactive bowel sounds. No guarding.   Musculoskeletal: She exhibits edema (1+ LE pitting edema biltareally up to knees ).  Neurological: She is alert and oriented to person, place, and time.    Assessment/Plan:  Principal Problem:   Upper abdominal pain Active Problems:   HYPERTENSION, BENIGN ESSENTIAL, CONTROLLED   Liver cirrhosis (HCC)   Hyperkalemia   AKI (acute kidney injury) ([place])   Ascites   Acute on chronic anemia   CKD (chronic kidney disease) stage 3, GFR 30-59 ml/min (HCC)  #Upper abdominal pain: Patient reports worsening abdominal pain in the setting of solumedrol taper. No N/V. Continues  to have BMs with Lactulose. CT abd/pelvis from 3/24 with foreign metallic object in posterior wall of stomach of unclear etiology. This is new compared to prior study from 1/21. Spoke to Dr Benson Norway, patient underwent EGD in 09/2017 and hemoclip was placed in that area due to marked oozing. Will not evaluate further.   - GI consulted, appreciate assistance and recommendations - Stop PO solumedrol  - Start prednisone 20 mgQD  - Continue megace 40 mg BID - Continue 25% concentrated albumin 12.5 g TID  - Fentanyl 12.5mg every 2 hours as needed forpain - Continue home rifaximinandlactulose 20 g TID with goal of 2-3 BM per day  - Restrict sodium to less than 2 g/day  #Acute on chronic anemia:stable  -Continue home ferrous sulfate  #Decompensated hepatic cirrhosis secondary to chronic hepatitis C:complicated by ascites and hepatic encephalopathy in the past. No history of SBP.Meld score of 30 on presentation.Nastableat130.LFTs stable.TBili 5.1 and Plts 71.  -Lasix 40 mg QD -Spironolactone discontinued by GI in setting of poor renal function - Continue home lactulose20 g TID  #Hepatorenal syndrome # CKD Stage III Baseline renal function unclear, appears to be worsening since 09/2017. Prior to this it was ~1.She has been unable since HD#1.Her renal function remains stable ~1.9-2 for the past 4 days. GI concern for underlying renal process given persistent worsening of renal function and requesting renal consult.  - Nephrology consulted, appreciate assistance and recommendations  -Spironolactone discontinued by  GI in setting of poor renal function -Continue albumin infusion as above -Lasix  40 mg QD   #Overactive bladder -Darifenacin(home Vesicare not in formulary)   Dispo: Anticipated discharge in approximately 2-3 day(s).   Welford Roche, MD 12/13/2017, 10:48 AM Pager: [phone]

## 2017-12-13 NOTE — Consult Note (Signed)
Reason for Consult: Renal failure Referring Physician: Dr. Joni Reining  Chief Complaint: Abdominal pain  Assessment/Plan: 1. Acute kidney injury on CKD - lowest creatinine was 1.6 back in late January 2019. Previously she's not had white cells in the urine and now appears to be present. This could be HRS  vs interstitial nephritis vs renal artery stenosis which is lower on the differential. The acute component has resolved somewhat and I wonder if she has progression of CKD.  - There is no proteinuria on u/a but I would like to check a SPEP/UPEP and UPC to determine if there is a paraprotein present. - Renal ultrasound to check size and cortical thickness -> possibility of a biopsy as this appears to be progression of renal disease. - Check complements, RF but cryo, MPGN type I are much lower on the differential bec there is no albuminuria. 2. Abdominal pain - per pt is improved from at admission. 3. Anemia - on FeSO4 4. 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 female  with hepatitis C, cirrhosis, hypertension, diabetes, GERD, breast cancer in 2011, chronic pain, depression p/w epigastric + right upper quadrant abdominal pain.  She also has had intermittent nausea +  vomiting.    Denies having any fevers, chills, diarrhea, or constipation.  States she takes lactulose with a goal of 2-3 bowel movements per day.  She is being followed by Dr. Almyra Free (GI).  She recently had an increase in the dose of her Lasix and spironolactone 4 days prior to this admission.  Upon arrival to the ED, vital signs stable.  Labs showing normal lipase, sodium 129, potassium 5.5, bicarb 19, creatinine 1.9, T bili 5.2.  No white count.  INR 2.1.  Right upper quadrant ultrasound showing mild thickening of the gallbladder wall and ascites.  No gallstones reported.  She was treated with Zosyn and diuresis w/ weights decr from 124.3kg to 116.7kg on 12/12/2017 before 118.9kg today.   She  denies any rashes, hematuria, foamy urine, NSAID use, nephrolithiasis or obstructive sxs.  Cr was ~1-1.6 in late 2018 into early 2018 and has fluctuated from an admission Cr of 2.43 to a creatinine as high as 2.6 on 3/24 before decreasing to 1.9.  Date   Cr 09/05/2017  1.15 10/07/2017  1.66  The lowest creatinine noted since 10/07/2017 is 1.66.       Review of Systems  All other systems reviewed and are negative.  Pertinent items are noted in HPI.  Chemistry and CBC: Creatinine  Date/Time Value Ref Range Status  07/22/2014 09:17 AM 0.8 0.6 - 1.1 mg/dL Final  04/22/2014 09:42 AM 0.9 0.6 - 1.1 mg/dL Final  10/22/2013 01:58 PM 0.9 0.6 - 1.1 mg/dL Final  01/26/2013 09:14 AM 0.7 0.6 - 1.1 mg/dL Final  07/22/2012 09:02 AM 0.7 0.6 - 1.1 mg/dL Final   Creatinine, Ser  Date/Time Value Ref Range Status  12/13/2017 05:23 AM 2.00 (H) 0.44 - 1.00 mg/dL Final  12/12/2017 04:10 AM 2.00 (H) 0.44 - 1.00 mg/dL Final  12/11/2017 04:14 AM 1.99 (H) 0.44 - 1.00 mg/dL Final  12/10/2017 05:04 AM 1.90 (H) 0.44 - 1.00 mg/dL Final  12/09/2017 04:52 AM 2.17 (H) 0.44 - 1.00 mg/dL Final  12/08/2017 05:41 AM 2.60 (H) 0.44 - 1.00 mg/dL Final  12/07/2017 06:36 AM 2.43 (H) 0.44 - 1.00 mg/dL Final  12/06/2017 05:37 AM 1.96 (H) 0.44 - 1.00 mg/dL Final  10/15/2017 04:48 AM 1.81 (H) 0.44 -  1.00 mg/dL Final  10/14/2017 08:43 AM 1.81 (H) 0.44 - 1.00 mg/dL Final  10/13/2017 11:44 AM 1.89 (H) 0.44 - 1.00 mg/dL Final  10/12/2017 03:56 AM 1.98 (H) 0.44 - 1.00 mg/dL Final  10/11/2017 04:19 AM 1.69 (H) 0.44 - 1.00 mg/dL Final  10/10/2017 07:03 AM 1.81 (H) 0.44 - 1.00 mg/dL Final  10/09/2017 06:12 AM 2.03 (H) 0.44 - 1.00 mg/dL Final  10/08/2017 01:17 PM 1.79 (H) 0.44 - 1.00 mg/dL Final  10/07/2017 03:12 PM 1.66 (H) 0.44 - 1.00 mg/dL Final  09/05/2017 12:23 AM 1.15 (H) 0.44 - 1.00 mg/dL Final  06/21/2017 04:53 AM 1.49 (H) 0.44 - 1.00 mg/dL Final  06/20/2017 07:04 AM 1.65 (H) 0.44 - 1.00 mg/dL Final  06/19/2017  03:35 AM 1.45 (H) 0.44 - 1.00 mg/dL Final  06/18/2017 05:25 AM 1.11 (H) 0.44 - 1.00 mg/dL Final  06/17/2017 08:02 PM 1.03 (H) 0.44 - 1.00 mg/dL Final  05/03/2016 12:00 PM 0.68 0.44 - 1.00 mg/dL Final  03/29/2016 05:34 PM 0.94 0.44 - 1.00 mg/dL Final  02/22/2016 09:37 PM 0.68 0.44 - 1.00 mg/dL Final  02/20/2016 11:40 PM 0.73 0.44 - 1.00 mg/dL Final  12/13/2013 01:10 PM 0.71 0.50 - 1.10 mg/dL Final  11/18/2012 10:30 AM 0.63 0.50 - 1.10 mg/dL Final  01/28/2012 11:18 AM 0.69 0.50 - 1.10 mg/dL Final  08/03/2011 11:00 AM 0.78 0.50 - 1.10 mg/dL Final  04/30/2011 03:32 PM 0.55 0.50 - 1.10 mg/dL Final  10/24/2010 03:07 PM 0.81 0.40 - 1.20 mg/dL Final  09/04/2010 02:08 PM 0.67 0.40 - 1.20 mg/dL Final  08/18/2010 10:21 AM 0.82 0.4 - 1.2 mg/dL Final  04/18/2010 09:40 PM 0.71 0.40 - 1.20 mg/dL Final  10/28/2009 07:38 PM 0.75 0.40 - 1.20 mg/dL Final  06/28/2008 08:28 PM 0.64 0.40 - 1.20 mg/dL Final   Recent Labs  Lab 12/07/17 0636 12/08/17 0541 12/09/17 0452 12/10/17 0504 12/11/17 0414 12/12/17 0410 12/13/17 0523  NA 126* 129* 128* 131* 131* 131* 130*  K 5.1 5.1 4.6 4.3 4.4 4.6 4.7  CL 98* 101 101 100* 98* 99* 97*  CO2 19* 21* 18* 22 24 23 22  GLUCOSE 124* 120* 127* 119* 126* 141* 144*  BUN 43* 50* 45* 40* 39* 44* 49*  CREATININE 2.43* 2.60* 2.17* 1.90* 1.99* 2.00* 2.00*  CALCIUM 8.5* 8.5* 8.5* 9.0 9.0 8.5* 8.6*   Recent Labs  Lab 12/10/17 0504 12/11/17 0414 12/12/17 0410 12/13/17 0523  WBC 10.9* 11.6* 13.3* 15.2*  HGB 8.5* 8.0* 8.0* 8.4*  HCT 23.2* 22.5* 22.2* 22.8*  MCV 89.2 89.6 90.6 91.2  PLT 75* 71* 70* 71*   Liver Function Tests: Recent Labs  Lab 12/11/17 0414 12/12/17 0410 12/13/17 0523  AST 73* 70* 105*  ALT 43 41 50  ALKPHOS 62 64 71  BILITOT 6.2* 5.1* 5.1*  PROT 6.1* 5.9* 6.0*  ALBUMIN 3.0* 3.0* 3.1*   No results for input(s): LIPASE, AMYLASE in the last 168 hours. No results for input(s): AMMONIA in the last 168 hours. Cardiac Enzymes: No results for  input(s): CKTOTAL, CKMB, CKMBINDEX, TROPONINI in the last 168 hours. Iron Studies: No results for input(s): IRON, TIBC, TRANSFERRIN, FERRITIN in the last 72 hours. PT/INR: @LABRCNTIP(inr:5)  Xrays/Other Studies: ) Results for orders placed or performed during the hospital encounter of 12/06/17 (from the past 48 hour(s))  CBC     Status: Abnormal   Collection Time: 12/12/17  4:10 AM  Result Value Ref Range   WBC 13.3 (H) 4.0 - 10.5 K/uL   RBC 2.45 (  L) 3.87 - 5.11 MIL/uL   Hemoglobin 8.0 (L) 12.0 - 15.0 g/dL   HCT 22.2 (L) 36.0 - 46.0 %   MCV 90.6 78.0 - 100.0 fL   MCH 32.7 26.0 - 34.0 pg   MCHC 36.0 30.0 - 36.0 g/dL   RDW 16.2 (H) 11.5 - 15.5 %   Platelets 70 (L) 150 - 400 K/uL    Comment: CONSISTENT WITH PREVIOUS RESULT Performed at [facility], [address] [place], [place] [address]   Comprehensive metabolic panel     Status: Abnormal   Collection Time: 12/12/17  4:10 AM  Result Value Ref Range   Sodium 131 (L) 135 - 145 mmol/L   Potassium 4.6 3.5 - 5.1 mmol/L   Chloride 99 (L) 101 - 111 mmol/L   CO2 23 22 - 32 mmol/L   Glucose, Bld 141 (H) 65 - 99 mg/dL   BUN 44 (H) 6 - 20 mg/dL   Creatinine, Ser 2.00 (H) 0.44 - 1.00 mg/dL   Calcium 8.5 (L) 8.9 - 10.3 mg/dL   Total Protein 5.9 (L) 6.5 - 8.1 g/dL   Albumin 3.0 (L) 3.5 - 5.0 g/dL   AST 70 (H) 15 - 41 U/L   ALT 41 14 - 54 U/L   Alkaline Phosphatase 64 38 - 126 U/L   Total Bilirubin 5.1 (H) 0.3 - 1.2 mg/dL   GFR calc non Af Amer 26 (L) >60 mL/min   GFR calc Af Amer 30 (L) >60 mL/min    Comment: (NOTE) The eGFR has been calculated using the CKD EPI equation. This calculation has not been validated in all clinical situations. eGFR's persistently <60 mL/min signify possible Chronic Kidney Disease.    Anion gap 9 5 - 15    Comment: Performed at [facility], [address] [place], [place] [address]  CBC     Status: Abnormal   Collection Time: 12/13/17  5:23 AM  Result Value Ref Range   WBC 15.2 (H)  4.0 - 10.5 K/uL   RBC 2.50 (L) 3.87 - 5.11 MIL/uL   Hemoglobin 8.4 (L) 12.0 - 15.0 g/dL   HCT 22.8 (L) 36.0 - 46.0 %   MCV 91.2 78.0 - 100.0 fL   MCH 33.6 26.0 - 34.0 pg   MCHC 36.8 (H) 30.0 - 36.0 g/dL   RDW 16.9 (H) 11.5 - 15.5 %   Platelets 71 (L) 150 - 400 K/uL    Comment: CONSISTENT WITH PREVIOUS RESULT Performed at [facility], [address] [place],  [address]   Comprehensive metabolic panel     Status: Abnormal   Collection Time: 12/13/17  5:23 AM  Result Value Ref Range   Sodium 130 (L) 135 - 145 mmol/L   Potassium 4.7 3.5 - 5.1 mmol/L   Chloride 97 (L) 101 - 111 mmol/L   CO2 22 22 - 32 mmol/L   Glucose, Bld 144 (H) 65 - 99 mg/dL   BUN 49 (H) 6 - 20 mg/dL   Creatinine, Ser 2.00 (H) 0.44 - 1.00 mg/dL   Calcium 8.6 (L) 8.9 - 10.3 mg/dL   Total Protein 6.0 (L) 6.5 - 8.1 g/dL   Albumin 3.1 (L) 3.5 - 5.0 g/dL   AST 105 (H) 15 - 41 U/L   ALT 50 14 - 54 U/L   Alkaline Phosphatase 71 38 - 126 U/L   Total Bilirubin 5.1 (H) 0.3 - 1.2 mg/dL   GFR calc non Af Amer 26 (L) >  60 mL/min   GFR calc Af Amer 30 (L) >60 mL/min    Comment: (NOTE) The eGFR has been calculated using the CKD EPI equation. This calculation has not been validated in all clinical situations. eGFR's persistently <60 mL/min signify possible Chronic Kidney Disease.    Anion gap 11 5 - 15    Comment: Performed at [facility], [address] [place], [place] [address]   No results found.  PMH:   Past Medical History:  Diagnosis Date  . Anxiety   . Arthritis    "qwhere" (12/06/2017)  . Bilateral swelling of feet   . Bone spur of foot   . Breast cancer, left breast (HCC) 2011  . Chronic bronchitis (HCC)   . Chronic lower back pain   . Depression   . GERD (gastroesophageal reflux disease)   . Hepatitis C   . Hypertension   . Insomnia   . Leg cramps   . Personal history of radiation therapy 2011   Left Breast Cancer  . Seasonal allergies    "take RX all year round" (12/06/2017)   . Type 2 diabetes, diet controlled (HCC)     PSH:   Past Surgical History:  Procedure Laterality Date  . BREAST BIOPSY Left 2011  . COLONOSCOPY    . DILATION AND CURETTAGE OF UTERUS    . epidural steroid injection    . ESOPHAGOGASTRODUODENOSCOPY (EGD) WITH PROPOFOL N/A 10/11/2017   Procedure: ESOPHAGOGASTRODUODENOSCOPY (EGD) WITH PROPOFOL;  Surgeon: Hung, Patrick, MD;  Location: [facility];  Service: Endoscopy;  Laterality: N/A;  . HYSTEROSCOPY W/D&C N/A 05/15/2016   Procedure: DILATATION AND CURETTAGE /HYSTEROSCOPY;  Surgeon: Carolyn Harraway-Smith, MD;  Location: [facility];  Service: Gynecology;  Laterality: N/A;  . IR PARACENTESIS  10/08/2017  . MASTECTOMY PARTIAL / LUMPECTOMY Left 08/2010   /notes 08/24/2010  . MOUTH SURGERY Bilateral 2010?   "cheeks were filling up w/water; dr. biopsied it; fluid drained outt"  . TONSILLECTOMY  ~ 1979  . TRIGGER FINGER RELEASE Bilateral 2004   "thumbs"    Allergies:  Allergies  Allergen Reactions  . Aspirin Other (See Comments)    Stomach aches   . Fexofenadine Other (See Comments)    Causes leg and back pain  . Hydrocodone Itching    Medications:   Prior to Admission medications   Medication Sig Start Date End Date Taking? Authorizing Provider  Alpha-D-Galactosidase (BEANO) TABS Take 1-2 tablets by mouth daily as needed (to prevent gas with certain foods).   Yes [provider]  Ascorbic Acid (VITAMIN C PO) Take 1 tablet by mouth daily.   Yes [provider]  ferrous sulfate 325 (65 FE) MG tablet Take 325 mg by mouth daily with breakfast.   Yes [provider]  furosemide (LASIX) 80 MG tablet Take 1 tablet (80 mg total) by mouth daily. 10/16/17  Yes Mikhail, Maryann, DO  hydrOXYzine (ATARAX/VISTARIL) 25 MG tablet Take 25 mg by mouth at bedtime as needed for itching 08/23/17  Yes [provider]  lactulose (CHRONULAC) 10 GM/15ML solution Take 30 mLs by mouth 3 (three) times daily. Hold dose for more than 4  loose bowel movements per 24hr. 06/11/17  Yes [provider]  levonorgestrel (MIRENA, 52 MG,) 20 MCG/24HR IUD 1 each by Intrauterine route once.   Yes [provider]  megestrol (MEGACE) 40 MG tablet Take 1 tablet (40 mg total) by mouth 2 (two) times daily. 09/05/17  Yes Degele, Julie P, MD  RA VITAMIN D-3   2000 units CAPS Take 2,000 Units by mouth daily.  01/09/16  Yes [provider]  rifaximin (XIFAXAN) 550 MG TABS tablet Take 550 mg by mouth 2 (two) times daily.   Yes [provider]  spironolactone (ALDACTONE) 100 MG tablet Take 2 tablets (200 mg total) by mouth daily. 10/16/17  Yes Mikhail, Velta Addison, DO  traMADol (ULTRAM) 50 MG tablet Take 1 tablet (50 mg total) by mouth every 6 (six) hours as needed. 10/15/17  Yes Mikhail, Maryann, DO  VESICARE 5 MG tablet Take 5 mg by mouth daily. 02/08/16  Yes [provider]    Discontinued Meds:   Medications Discontinued During This Encounter  Medication Reason  . ondansetron (ZOFRAN) injection 4 mg   . enoxaparin (LOVENOX) injection 40 mg   . fentaNYL (SUBLIMAZE) injection 50 mcg   . fentaNYL (SUBLIMAZE) injection 25 mcg   . lidocaine (XYLOCAINE) 2 % injection Returned to ADS  . feeding supplement (BOOST / RESOURCE BREEZE) liquid 1 Container   . furosemide (LASIX) tablet 80 mg   . furosemide (LASIX) tablet 80 mg   . spironolactone (ALDACTONE) tablet 100 mg   . furosemide (LASIX) tablet 80 mg   . feeding supplement (BOOST / RESOURCE BREEZE) liquid 1 Container   . piperacillin-tazobactam (ZOSYN) IVPB 3.375 g   . furosemide (LASIX) tablet 40 mg   . methylPREDNISolone (MEDROL DOSEPAK) tablet 4 mg     Social History:  reports that she quit smoking about 8 years ago. Her smoking use included cigarettes. She has a 20.00 pack-year smoking history. She has never used smokeless tobacco. She reports that she drank alcohol. She reports that she has current or past drug history. Drugs: Cocaine and  Marijuana.  Family History:   Family History  Problem Relation Age of Onset  . Diabetes Mother   . Diabetes Father   . Hypertension Other     Blood pressure (!) 134/52, pulse 79, temperature 98.2 F (36.8 C), resp. rate 18, height 5' 7" (1.702 m), weight 118.9 kg (262 lb 2 oz), last menstrual period 06/04/2017, SpO2 98 %. General appearance: alert, cooperative and appears stated age Head: Normocephalic, without obvious abnormality, atraumatic Eyes: negative Neck: no adenopathy, no carotid bruit, supple, symmetrical, trachea midline and thyroid not enlarged, symmetric, no tenderness/mass/nodules Back: symmetric, no curvature. ROM normal. No CVA tenderness. Resp: rales bibasilar Chest wall: no tenderness Cardio: regular rate and rhythm, S1, S2 normal, no murmur, click, rub or gallop GI: soft, non-tender; bowel sounds normal; no masses,  no organomegaly Extremities: edema 1-2+ Pulses: 2+ and symmetric Skin: Skin color, texture, turgor normal. No rashes or lesions Lymph nodes: Cervical, supraclavicular, and axillary nodes normal. Neurologic: Grossly normal       Larence Thone, Hunt Oris, MD 12/13/2017, 3:21 PM

## 2017-12-13 NOTE — Progress Notes (Signed)
Subjective: No acute events.  Rib pain worsened yesterday.  Objective: Vital signs in last 24 hours: Temp:  [97.8 F (36.6 C)-98.3 F (36.8 C)] 98.2 F (36.8 C) (03/29 0532) Pulse Rate:  [77-79] 79 (03/29 0532) Resp:  [16-18] 18 (03/29 0532) BP: (134-137)/(52-58) 134/52 (03/29 0532) SpO2:  [98 %-100 %] 98 % (03/29 0532) Weight:  [116.7 kg (257 lb 4.4 oz)-118.9 kg (262 lb 2 oz)] 118.9 kg (262 lb 2 oz) (03/29 0532) Last BM Date: 12/12/17  Intake/Output from previous day: 03/28 0701 - 03/29 0700 In: 620 [P.O.:570; IV Piggyback:50] Out: 1125 [Urine:1125] Intake/Output this shift: No intake/output data recorded.  General appearance: alert and no distress GI: soft, non-tender; bowel sounds normal; no masses,  no organomegaly  Lab Results: Recent Labs    12/11/17 0414 12/12/17 0410 12/13/17 0523  WBC 11.6* 13.3* 15.2*  HGB 8.0* 8.0* 8.4*  HCT 22.5* 22.2* 22.8*  PLT 71* 70* 71*   BMET Recent Labs    12/11/17 0414 12/12/17 0410 12/13/17 0523  NA 131* 131* 130*  K 4.4 4.6 4.7  CL 98* 99* 97*  CO2 24 23 22   GLUCOSE 126* 141* 144*  BUN 39* 44* 49*  CREATININE 1.99* 2.00* 2.00*  CALCIUM 9.0 8.5* 8.6*   LFT Recent Labs    12/13/17 0523  PROT 6.0*  ALBUMIN 3.1*  AST 105*  ALT 50  ALKPHOS 71  BILITOT 5.1*   PT/INR No results for input(s): LABPROT, INR in the last 72 hours. Hepatitis Panel No results for input(s): HEPBSAG, HCVAB, HEPAIGM, HEPBIGM in the last 72 hours. C-Diff No results for input(s): CDIFFTOX in the last 72 hours. Fecal Lactopherrin No results for input(s): FECLLACTOFRN in the last 72 hours.  Studies/Results: No results found.  Medications:  Scheduled: . cholecalciferol  2,000 Units Oral Daily  . darifenacin  7.5 mg Oral Daily  . feeding supplement  1 Container Oral TID BM  . feeding supplement (PRO-STAT SUGAR FREE 64)  30 mL Oral BID  . ferrous sulfate  325 mg Oral Q breakfast  . furosemide  40 mg Oral Daily  . lactulose  20 g Oral  TID  . megestrol  40 mg Oral BID  . methylPREDNISolone  4 mg Oral 4X daily taper  . multivitamin with minerals  1 tablet Oral Daily  . rifaximin  550 mg Oral BID   Continuous: . albumin human      Assessment/Plan: 1) Decompensated cirrhosis. 2) Renal insufficiency. 3) Ascites. 4) Rib pain. 5) Leukocytosis.   She had more rib pain yesterday, which is not uncommon to see with the tapering dose of the Medrol Dose Pack.  In this instance, she can be treated with a moderate dose of prednisone with a slower taper.  My main concern is her creatinine.  Her weight has increased by 2 kg and her abdomen appears to be slightly more tense today.  Her lower extremity edema continues to improve.  She has been on albumin for the entire duration of her hospitalization to help treat her renal function as well as her admission hyponatremia.  From the hyponatremia standpoint she does not need the albumin anymore, however, her creatinine is still not improving.  Her baseline creatinine was around 1.0.  Plan: 1) Recommend Renal consultation. 2) D/C Medrol Dose Pack and start on prednisone 20 mg QD.  LOS: 7 days   Fitzpatrick Alberico D 12/13/2017, 7:51 AM

## 2017-12-14 DIAGNOSIS — K7460 Unspecified cirrhosis of liver: Principal | ICD-10-CM

## 2017-12-14 LAB — COMPREHENSIVE METABOLIC PANEL
ALK PHOS: 73 U/L (ref 38–126)
ALT: 78 U/L — ABNORMAL HIGH (ref 14–54)
ANION GAP: 8 (ref 5–15)
AST: 186 U/L — ABNORMAL HIGH (ref 15–41)
Albumin: 3.2 g/dL — ABNORMAL LOW (ref 3.5–5.0)
BILIRUBIN TOTAL: 5.3 mg/dL — AB (ref 0.3–1.2)
BUN: 50 mg/dL — ABNORMAL HIGH (ref 6–20)
CALCIUM: 8.6 mg/dL — AB (ref 8.9–10.3)
CO2: 23 mmol/L (ref 22–32)
Chloride: 98 mmol/L — ABNORMAL LOW (ref 101–111)
Creatinine, Ser: 1.75 mg/dL — ABNORMAL HIGH (ref 0.44–1.00)
GFR calc non Af Amer: 30 mL/min — ABNORMAL LOW (ref >60)
GFR, EST AFRICAN AMERICAN: 35 mL/min — AB (ref >60)
Glucose, Bld: 138 mg/dL — ABNORMAL HIGH (ref 65–99)
POTASSIUM: 4.9 mmol/L (ref 3.5–5.1)
Sodium: 129 mmol/L — ABNORMAL LOW (ref 135–145)
TOTAL PROTEIN: 5.9 g/dL — AB (ref 6.5–8.1)

## 2017-12-14 LAB — URINALYSIS, ROUTINE W REFLEX MICROSCOPIC
BILIRUBIN URINE: NEGATIVE
Glucose, UA: NEGATIVE mg/dL
KETONES UR: NEGATIVE mg/dL
Nitrite: NEGATIVE
PROTEIN: NEGATIVE mg/dL
SPECIFIC GRAVITY, URINE: 1.013 (ref 1.005–1.030)
pH: 6 (ref 5.0–8.0)

## 2017-12-14 LAB — CBC
HEMATOCRIT: 22.9 % — AB (ref 36.0–46.0)
HEMOGLOBIN: 8.1 g/dL — AB (ref 12.0–15.0)
MCH: 32 pg (ref 26.0–34.0)
MCHC: 35.4 g/dL (ref 30.0–36.0)
MCV: 90.5 fL (ref 78.0–100.0)
Platelets: 71 10*3/uL — ABNORMAL LOW (ref 150–400)
RBC: 2.53 MIL/uL — AB (ref 3.87–5.11)
RDW: 17.1 % — ABNORMAL HIGH (ref 11.5–15.5)
WBC: 19.3 10*3/uL — AB (ref 4.0–10.5)

## 2017-12-14 LAB — C4 COMPLEMENT: COMPLEMENT C4, BODY FLUID: 5 mg/dL — AB (ref 14–44)

## 2017-12-14 LAB — SODIUM, URINE, RANDOM: SODIUM UR: 23 mmol/L

## 2017-12-14 LAB — CREATININE, URINE, RANDOM: Creatinine, Urine: 87.64 mg/dL

## 2017-12-14 LAB — C3 COMPLEMENT: C3 Complement: 27 mg/dL — ABNORMAL LOW (ref 82–167)

## 2017-12-14 LAB — RHEUMATOID FACTOR: RHEUMATOID FACTOR: 23.3 [IU]/mL — AB (ref 0.0–13.9)

## 2017-12-14 NOTE — Progress Notes (Signed)
Subjective:  No acute events overnight. Patient sleeping when seen this morning. States her abdominal pain is improved from yesterday and voices no complaints other than feeling tired. Discussed with patient will continue renal workup and follow up with GI. Patient verbalized understanding and in agreement with plan. All questions answered.   Objective:  Vital signs in last 24 hours: Vitals:   12/13/17 0532 12/13/17 1602 12/13/17 2154 12/14/17 0551  BP: (!) 134/52 (!) 144/56 (!) 158/64 (!) 154/60  Pulse: 79 80 87 87  Resp: 18  18 18   Temp: 98.2 F (36.8 C) 98.8 F (37.1 C) 98.4 F (36.9 C) 99 F (37.2 C)  TempSrc:  Oral Oral Oral  SpO2: 98% 100% 97% 96%  Weight: 262 lb 2 oz (118.9 kg)   273 lb 5.9 oz (124 kg)  Height:       Physical Exam  Constitutional: She is oriented to person, place, and time.  Obese, tired-appearing female sleeping in bed in no acute distress   Cardiovascular: Normal rate and regular rhythm. Exam reveals no gallop and no friction rub.  Murmur (II/VI systolic murmur best heard USB ) heard. Pulmonary/Chest: Effort normal and breath sounds normal. No respiratory distress. She has no wheezes. She has no rales.  Abdominal:  Abdomen is obese and distended. Decreased bowel sounds. No tenderness to palpation over upper quadrants.   Musculoskeletal: She exhibits edema (1-2+ LE pitting edema bilaterally up to knees).  Neurological: She is alert and oriented to person, place, and time.  Patient sleeping when seen but arouses easily to voice and answers questions appropriately.     Assessment/Plan:  Principal Problem:   Upper abdominal pain Active Problems:   HYPERTENSION, BENIGN ESSENTIAL, CONTROLLED   Liver cirrhosis (HCC)   Hyperkalemia   AKI (acute kidney injury) ([facility])   Ascites   Acute on chronic anemia   CKD (chronic kidney disease) stage 3, GFR 30-59 ml/min (HCC)  #Upper abdominal pain: Unclear etiology. Initial concern for acalculous cholecystitis  due to gallbladder wall thickening on imaging and started on Zosyn (3/22-3/28). Paracentesis ordered to rule out SBP, but not enough ascitic fluid to remove per IR. Trial of Medrol dose pack started with initial improvement in pain. However, abdominal worsen when PO solumedrol was tapered and she was started on low dose prednisone on 3/28. Abdominal pain improved this morning.  - GI following, appreciate assistance and recommendations.  - Continue prednisone 20 mgQD  - Continue megace 40 mg BID - Fentanyl 12.5mg every 2 hours as needed forpain - Restrict sodium to less than 2 g/day  #Decompensated hepatic cirrhosis secondary to chronic hepatitis C:complicated by ascites and hepatic encephalopathy in the past. No history of SBP.Meld score of 30 on presentation.Nastableat 129.LFTs trending up 186/78.TBilistable ~5. andPlts stable as well ~70s.  -Lasix 40 mg QD - Continue 25% concentrated albumin 12.5 g TID  - Continue home rifaximinandlactulose 20 g TID with goal of 2-3 BM per day  -Spironolactone discontinued by GI in setting of poor renal function - Continue home lactulose20 g TID  #Hepatorenal syndrome # CKD Stage III  Baseline renal function unclear, appears to be worsening since 09/2017. Prior to this it was ~1.Her renal function has remained stable ~1.9-2 for the past 4 days with mild improvement today to 1.75. Nephrology consulted per GI request. Concern for HRS vs interstitial nephritis given new pyuria on UA vs progression of CKD.  Renal ultrasound order to evaluate kidney size and cortical thickness which showed benign appearing left renal  cysts and moderate ascites, but otherwise unremarkable.  Per nephrology, may benefit from renal biopsy as this appears to be progression of renal disease.  RF elevated at 23.3 and C3/C4 low. Workup as below.  - Nephrology following, appreciate assistance and recommendations - Follow up SPEP, UPEP, IFE, light chains to evaluate for  presence of paraprotein, no proteinuria on UA  - Continue 25% concentrated albumin 12.5 g TID   -Spironolactone discontinued by GI in setting of poor renal function -Continue albumin infusion as above -Lasix  40 mg QD  #Acute on chronic anemia:stable -Continue home ferrous sulfate  #Overactive bladder -Darifenacin(home Vesicare not in formulary)   Dispo: Anticipated discharge in approximately 2-3 day(s).   Welford Roche, MD 12/14/2017, 6:53 AM Pager: [phone]

## 2017-12-14 NOTE — Progress Notes (Signed)
[facility] Progress Note   62 y.o. female  with hepatitis C, cirrhosis, hypertension, diabetes, GERD, breast cancer in 2011, chronic pain, depression p/w epigastric + right upper quadrant abdominal pain. She also has had intermittent nausea +  vomiting.  Denies having any fevers, chills, diarrhea, or constipation. She is  followed by Dr. Almyra Free (GI) w/ recent increase in the dose of her Lasix and spironolactone 4 days prior to this admission.  She was treated with Zosyn for cholecystitis and diuresis w/ weights decr from 124.3kg to 116.7kg on 12/12/2017 before 118.9kg on 3/29.   Cr was ~1-1.6 in late 2018 into early 2018 and has fluctuated from an admission Cr of 2.43 to a creatinine as high as 2.6 on 3/24 before decreasing to 1.9.  Date                             Cr 09/05/2017                  1.15 10/07/2017                  1.66  The lowest creatinine noted since 10/07/2017 is 1.66.   Assessment/ Plan:   1. Acute kidney injury on CKD - lowest creatinine was 1.6 back in late January 2019. Previously she's not had white cells in the urine and now appears to be present. This could be HRS  vs interstitial nephritis vs renal artery stenosis which is lower on the differential. The acute component has resolved somewhat and I wonder if she has progression of CKD.  - There is no proteinuria on u/a but I would like to check a SPEP/UPEP and UPC to determine if there is a paraprotein present -> awaiting collection. - Renal ultrasound -> NL size and echogenicity w/ a simple cyst. - Eventually will likely need a renal biopsy but this can be done as an outpatient as there appears to be a chronic component as well to the renal disease. There is certainly progression of renal disease. - Check complements --> c3 and c4 are low. - RF but cryo, MPGN type I are much lower on the differential bec there is no albuminuria.  - Will need f/u w/ [facility] to monitor renal function and possibly  schedule a renal biopsy.  2. Abdominal pain - per pt is improved from at admission. 3. Anemia - on FeSO4 4. 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 Denies dyspnea but still has abdominal fullness.    Objective:   BP (!) 154/60 (BP Location: Left Arm)   Pulse 87   Temp 99 F (37.2 C) (Oral)   Resp 18   Ht 5\' 7"  (1.702 m)   Wt 124 kg (273 lb 5.9 oz)   LMP 06/04/2017 (LMP Unknown)   SpO2 96%   BMI 42.82 kg/m   Intake/Output Summary (Last 24 hours) at 12/14/2017 5638 Last data filed at 12/14/2017 0559 Gross per 24 hour  Intake 1285 ml  Output 1550 ml  Net -265 ml   Weight change: 7.3 kg (16 lb 1.5 oz)  Physical Exam: General appearance: alert, cooperative and appears stated age Head: Normocephalic, without obvious abnormality, atraumatic Back: symmetric, no curvature. ROM normal. No CVA tenderness. Resp: rales bibasilar Cardio: regular rate and rhythm, S1, S2 normal, no murmur, click, rub or gallop GI: soft, non-tender; bowel sounds normal; no masses,  no  organomegaly Extremities: edema 1+     Imaging: US Renal  Result Date: 12/13/2017 CLINICAL DATA:  Renal failure. EXAM: RENAL / URINARY TRACT ULTRASOUND COMPLETE COMPARISON:  Body CT 12/08/2017, abdominal ultrasound 12/07/2017 FINDINGS: Right Kidney: Length: 10.3 cm. Echogenicity within normal limits. No mass or hydronephrosis visualized. Left Kidney: Length: 12.5 cm. Echogenicity within normal limits. Benign-appearing cyst off of the superior pole of the left kidney measures 4.4 x 4.3 x 4.5 cm. Bladder: Appears normal for degree of bladder distention. Moderate volume ascites. IMPRESSION: Benign-appearing left renal cyst, otherwise normal appearance of the kidneys. Moderate volume ascites. Electronically Signed   By: Fidela Salisbury M.D.   On: 12/13/2017 23:44    Labs: BMET Recent Labs  Lab 12/08/17 0541 12/09/17 0452 12/10/17 0504 12/11/17 0414 12/12/17 0410  12/13/17 0523 12/14/17 0731  NA 129* 128* 131* 131* 131* 130* 129*  K 5.1 4.6 4.3 4.4 4.6 4.7 4.9  CL 101 101 100* 98* 99* 97* 98*  CO2 21* 18* 22 24 23 22 23   GLUCOSE 120* 127* 119* 126* 141* 144* 138*  BUN 50* 45* 40* 39* 44* 49* 50*  CREATININE 2.60* 2.17* 1.90* 1.99* 2.00* 2.00* 1.75*  CALCIUM 8.5* 8.5* 9.0 9.0 8.5* 8.6* 8.6*   CBC Recent Labs  Lab 12/11/17 0414 12/12/17 0410 12/13/17 0523 12/14/17 0731  WBC 11.6* 13.3* 15.2* 19.3*  HGB 8.0* 8.0* 8.4* 8.1*  HCT 22.5* 22.2* 22.8* 22.9*  MCV 89.6 90.6 91.2 90.5  PLT 71* 70* 71* 71*    Medications:    . cholecalciferol  2,000 Units Oral Daily  . darifenacin  7.5 mg Oral Daily  . feeding supplement  1 Container Oral TID BM  . feeding supplement (PRO-STAT SUGAR FREE 64)  30 mL Oral BID  . ferrous sulfate  325 mg Oral Q breakfast  . furosemide  40 mg Oral Daily  . lactulose  20 g Oral TID  . megestrol  40 mg Oral BID  . multivitamin with minerals  1 tablet Oral Daily  . predniSONE  20 mg Oral Q breakfast  . rifaximin  550 mg Oral BID      Otelia Santee, MD 12/14/2017, 9:38 AM

## 2017-12-14 NOTE — Progress Notes (Signed)
Unable to start 24 hour urine collection at this time. Pt has urinated mixed in with stool x2 this shift. Educated pt to try to urinate first and take the urine hat out the [facility] before having BM. Pt stated "I'll try". Will continue to monitor pt.

## 2017-12-14 NOTE — Progress Notes (Signed)
[facility] Progress Note Covering for Drs. Mann/Hung this weekend    Since last GI note: Abdominal pain a bit improved.  Renal input noted, appreciated.  Objective: Vital signs in last 24 hours: Temp:  [98.4 F (36.9 C)-99 F (37.2 C)] 99 F (37.2 C) (03/30 0551) Pulse Rate:  [80-87] 87 (03/30 0551) Resp:  [18] 18 (03/30 0551) BP: (144-158)/(56-64) 154/60 (03/30 0551) SpO2:  [96 %-100 %] 96 % (03/30 0551) Weight:  [273 lb 5.9 oz (124 kg)] 273 lb 5.9 oz (124 kg) (03/30 0551) Last BM Date: 12/14/17 General: alert and oriented times 3 Heart: regular rate and rythm Abdomen: soft, non-tender, soft, obvious ascites+, non-tender   Lab Results: Recent Labs    12/12/17 0410 12/13/17 0523 12/14/17 0731  WBC 13.3* 15.2* 19.3*  HGB 8.0* 8.4* 8.1*  PLT 70* 71* 71*  MCV 90.6 91.2 90.5   Recent Labs    12/12/17 0410 12/13/17 0523 12/14/17 0731  NA 131* 130* 129*  K 4.6 4.7 4.9  CL 99* 97* 98*  CO2 23 22 23   GLUCOSE 141* 144* 138*  BUN 44* 49* 50*  CREATININE 2.00* 2.00* 1.75*  CALCIUM 8.5* 8.6* 8.6*   Recent Labs    12/12/17 0410 12/13/17 0523 12/14/17 0731  PROT 5.9* 6.0* 5.9*  ALBUMIN 3.0* 3.1* 3.2*  AST 70* 105* 186*  ALT 41 50 78*  ALKPHOS 64 71 73  BILITOT 5.1* 5.1* 5.3*   No results for input(s): INR in the last 72 hours.   Studies/Results: US Renal  Result Date: 12/13/2017 CLINICAL DATA:  Renal failure. EXAM: RENAL / URINARY TRACT ULTRASOUND COMPLETE COMPARISON:  Body CT 12/08/2017, abdominal ultrasound 12/07/2017 FINDINGS: Right Kidney: Length: 10.3 cm. Echogenicity within normal limits. No mass or hydronephrosis visualized. Left Kidney: Length: 12.5 cm. Echogenicity within normal limits. Benign-appearing cyst off of the superior pole of the left kidney measures 4.4 x 4.3 x 4.5 cm. Bladder: Appears normal for degree of bladder distention. Moderate volume ascites. IMPRESSION: Benign-appearing left renal cyst, otherwise normal appearance of  the kidneys. Moderate volume ascites. Electronically Signed   By: Fidela Salisbury M.D.   On: 12/13/2017 23:44     Medications: Scheduled Meds: . cholecalciferol  2,000 Units Oral Daily  . darifenacin  7.5 mg Oral Daily  . feeding supplement  1 Container Oral TID BM  . feeding supplement (PRO-STAT SUGAR FREE 64)  30 mL Oral BID  . ferrous sulfate  325 mg Oral Q breakfast  . furosemide  40 mg Oral Daily  . lactulose  20 g Oral TID  . megestrol  40 mg Oral BID  . multivitamin with minerals  1 tablet Oral Daily  . predniSONE  20 mg Oral Q breakfast  . rifaximin  550 mg Oral BID   Continuous Infusions: . albumin human     PRN Meds:.alum & mag hydroxide-simeth, fentaNYL (SUBLIMAZE) injection, hydrOXYzine, ondansetron (ZOFRAN) IV, sodium chloride flush    Assessment/Plan: 62 y.o. female with decompensated cirrhosis.  Renal input noted, appreciated.  Cr a bit lower today.  WBC up, likely from steroids.  Will follow along.   Milus Banister, MD  12/14/2017, 1:02 PM [facility] Pager [phone]

## 2017-12-15 DIAGNOSIS — D72829 Elevated white blood cell count, unspecified: Secondary | ICD-10-CM

## 2017-12-15 DIAGNOSIS — R188 Other ascites: Secondary | ICD-10-CM

## 2017-12-15 LAB — COMPREHENSIVE METABOLIC PANEL
ALBUMIN: 3.3 g/dL — AB (ref 3.5–5.0)
ALK PHOS: 62 U/L (ref 38–126)
ALT: 75 U/L — AB (ref 14–54)
ANION GAP: 8 (ref 5–15)
AST: 163 U/L — ABNORMAL HIGH (ref 15–41)
BILIRUBIN TOTAL: 6 mg/dL — AB (ref 0.3–1.2)
BUN: 52 mg/dL — ABNORMAL HIGH (ref 6–20)
CALCIUM: 8.6 mg/dL — AB (ref 8.9–10.3)
CO2: 23 mmol/L (ref 22–32)
CREATININE: 1.75 mg/dL — AB (ref 0.44–1.00)
Chloride: 98 mmol/L — ABNORMAL LOW (ref 101–111)
GFR calc non Af Amer: 30 mL/min — ABNORMAL LOW (ref >60)
GFR, EST AFRICAN AMERICAN: 35 mL/min — AB (ref >60)
GLUCOSE: 116 mg/dL — AB (ref 65–99)
Potassium: 5 mmol/L (ref 3.5–5.1)
Sodium: 129 mmol/L — ABNORMAL LOW (ref 135–145)
TOTAL PROTEIN: 5.8 g/dL — AB (ref 6.5–8.1)

## 2017-12-15 LAB — PROTEIN / CREATININE RATIO, URINE
Creatinine, Urine: 93.36 mg/dL
Total Protein, Urine: <6 mg/dL

## 2017-12-15 NOTE — Progress Notes (Signed)
Subjective:  No acute events overnight. Patients appears in better spirits this morning. Her abdominal pain overall improved. States it only worsens when she moves around in bed. Continues to report poor PO intake. Endorses nausea relived by IV meds, no vomiting. No other complaints.   Objective:  Vital signs in last 24 hours: Vitals:   12/14/17 1415 12/14/17 2049 12/15/17 0530 12/15/17 0536  BP: (!) 139/56 (!) 143/57 (!) 134/59   Pulse: 82 85 80   Resp: 18 17 18    Temp: 99.1 F (37.3 C) 98.6 F (37 C) 98.5 F (36.9 C)   TempSrc: Oral Oral Oral   SpO2: 99% 98% 96%   Weight:    267 lb 10.2 oz (121.4 kg)  Height:       Physical Exam  Constitutional: She is oriented to person, place, and time.  Obese, chronically ill appearing female sitting up in bed in no acute distress   Cardiovascular: Normal rate and regular rhythm. Exam reveals no gallop and no friction rub.  Murmur (II/VI systolicm murmur unchanged ) heard. Pulmonary/Chest: Breath sounds normal. No respiratory distress. She has no wheezes. She has no rales.  Abdominal:  Abdomen is obese and distended. Non tender to palpation. Normoactive bowel sounds.   Musculoskeletal: She exhibits edema (1+ LE pitting edema bilaterally ).  Neurological: She is alert and oriented to person, place, and time.    Assessment/Plan:  Principal Problem:   Upper abdominal pain Active Problems:   HYPERTENSION, BENIGN ESSENTIAL, CONTROLLED   Liver cirrhosis (HCC)   Hyperkalemia   AKI (acute kidney injury) ([place])   Ascites   Acute on chronic anemia   CKD (chronic kidney disease) stage 3, GFR 30-59 ml/min (HCC)  #Upper abdominal pain: Unclear etiology. Initial concern for acalculous cholecystitis due to gallbladder wall thickening on imaging and started on Zosyn (3/22-3/28). Paracentesis ordered to rule out SBP, but not enough ascitic fluid to remove per IR. Trial of Medrol dose pack started with initial improvement in pain. However,  abdominal worsen when PO solumedrol was tapered and she was started on low dose prednisone on 3/28. Abdominal pain improved since starting prednisone. Will continue to work with GI.  - GI following, appreciate assistance and recommendations.  - Continue prednisone 20 mgQD - Continue megace 40 mg BID - Fentanyl 12.5mg every 2 hours as needed forpain - Restrict sodium to less than 2 g/day  #Decompensated hepatic cirrhosis secondary to chronic hepatitis C:complicated by ascites and hepatic encephalopathy in the past. No history of SBP.Meld score of 30 on presentation.Nastableat 129.LFTs and TBilistable.  -Lasix 40 mg QD - Continue 25% concentrated albumin 12.5 g TID  - Continue home rifaximinandlactulose 20 g TID with goal of 2-3 BM per day  -Spironolactone discontinued by GI in setting of poor renal function - Continue home lactulose20 g TID  #Hepatorenal syndrome # CKD Stage III  Baseline renal function unclear, appears to be worsening since 09/2017.Prior to this it was ~1.Renal function improved and stable at 1.7 for past 2 days.Nephrology consulted per GI request. Concern for HRS vs interstitial nephritis given new pyuria on UA vs progression of CKD.  Renal ultrasound order to evaluate kidney size and cortical thickness which showed benign appearing left renal cysts and moderate ascites, but otherwise unremarkable.  Per nephrology, plan for outpatient renal biopsy as this appears to be progression of renal disease.  RF elevated at 23.3 and C3/C4 low. Workup as below.  - Nephrology following, appreciate assistance and recommendations - Follow up SPEP,  UPEP, IFE, light chains to evaluate for presence of paraprotein, no proteinuria on UA  - Continue 25% concentrated albumin 12.5 g TID  -Spironolactone discontinued by GI in setting of poor renal function -Continue albumin infusion as above -Lasix 40 mg QD  #Acute on chronic anemia:stable -Continue home  ferrous sulfate  #Overactive bladder -Darifenacin(home [employer] not in formulary)   Dispo: Anticipated discharge in approximately 2-3 day(s).   Welford Roche, MD 12/15/2017, 7:52 AM Pager: [phone]

## 2017-12-15 NOTE — Progress Notes (Signed)
[facility] Progress Note   62 y.o.femalewith hepatitis C, cirrhosis, hypertension, diabetes, GERD, breast cancer in 2011, chronic pain, depression p/wepigastric+right upper quadrant abdominal pain. She alsohas had intermittent nausea +vomiting. Denies having any fevers, chills, diarrhea, or constipation. She is  followed by Dr. Almyra Free (GI) w/ recent increaseinthe dose of her Lasix and spironolactone 4 days prior to this admission.  She was treated with Zosyn for cholecystitis and diuresis w/ weights decr from 124.3kg to 116.7kg on 12/12/2017 before 118.9kg on 3/29.   Cr was ~1-1.6 in late 2018 into early 2018 and has fluctuated from an admission Cr of 2.43 to a creatinine as high as 2.6 on 3/24 before decreasing to 1.9.  DateCr 12/20/20181.15 01/21/20191.66  The lowest creatinine noted since 10/07/2017 is 1.66.   Assessment/ Plan:   1. Acute kidney injury on CKD - lowest creatinine was 1.6 back in late January 2019. Previously she's not had white cells in the urine and now appears to be present. This could be HRS vs interstitial nephritis vs renal artery stenosis which is lower on the differential. The acute component has resolved somewhat and I wonder if she has progression of CKD.  - There is no proteinuria on u/a but I am still checking a SPEP/UPEP and UPC to determine if there is a paraprotein present. - Renal ultrasound -> NL size and echogenicity w/ a simple cyst. - Eventually will likely need a renal biopsy but this can be done as an outpatient as there appears to be a chronic component as well to the renal disease. There is certainly progression of renal disease. - Check complements --> c3 and c4 are low. - RF but cryo, MPGN type I are much lower on the differential bec there is no albuminuria.  - Will need f/u w/ [facility] to monitor renal function and possibly schedule a renal  biopsy.  Fortunately renal function is stable but I agree that there is progression of renal disease.  2. Abdominal pain - per pt is improved from at admission. 3. Anemia - on FeSO4 4. 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 Denies dyspnea but still has abdominal fullness.  She feels the lower ext swelling is much improved and she is close to her baseline.   Objective:   BP (!) 134/59 (BP Location: Right Arm)   Pulse 80   Temp 98.5 F (36.9 C) (Oral)   Resp 18   Ht 5\' 7"  (1.702 m)   Wt 121.4 kg (267 lb 10.2 oz)   LMP 06/04/2017 (LMP Unknown)   SpO2 96%   BMI 41.92 kg/m   Intake/Output Summary (Last 24 hours) at 12/15/2017 1025 Last data filed at 12/15/2017 0536 Gross per 24 hour  Intake 420 ml  Output 875 ml  Net -455 ml   Weight change: -2.6 kg (-5 lb 11.7 oz)  Physical Exam: General appearance:alert, cooperative and appears stated age Head:Normocephalic, without obvious abnormality, atraumatic Back:symmetric, no curvature. ROM normal. No CVA tenderness. Resp:ralesbibasilar Cardio:regular rate and rhythm, S1, S2 normal, no murmur, click, rub or gallop BT:DVVO, non-tender; bowel sounds normal; no masses, no organomegaly Extremities:edema1+    Imaging: US Renal  Result Date: 12/13/2017 CLINICAL DATA:  Renal failure. EXAM: RENAL / URINARY TRACT ULTRASOUND COMPLETE COMPARISON:  Body CT 12/08/2017, abdominal ultrasound 12/07/2017 FINDINGS: Right Kidney: Length: 10.3 cm. Echogenicity within normal limits. No mass or hydronephrosis visualized. Left Kidney: Length: 12.5 cm. Echogenicity within normal limits. Benign-appearing cyst off of the  superior pole of the left kidney measures 4.4 x 4.3 x 4.5 cm. Bladder: Appears normal for degree of bladder distention. Moderate volume ascites. IMPRESSION: Benign-appearing left renal cyst, otherwise normal appearance of the kidneys. Moderate volume ascites. Electronically Signed   By: Fidela Salisbury M.D.   On: 12/13/2017 23:44    Labs: BMET Recent Labs  Lab 12/09/17 4401 12/10/17 0504 12/11/17 0414 12/12/17 0410 12/13/17 0523 12/14/17 0731 12/15/17 0349  NA 128* 131* 131* 131* 130* 129* 129*  K 4.6 4.3 4.4 4.6 4.7 4.9 5.0  CL 101 100* 98* 99* 97* 98* 98*  CO2 18* 22 24 23 22 23 23   GLUCOSE 127* 119* 126* 141* 144* 138* 116*  BUN 45* 40* 39* 44* 49* 50* 52*  CREATININE 2.17* 1.90* 1.99* 2.00* 2.00* 1.75* 1.75*  CALCIUM 8.5* 9.0 9.0 8.5* 8.6* 8.6* 8.6*   CBC Recent Labs  Lab 12/11/17 0414 12/12/17 0410 12/13/17 0523 12/14/17 0731  WBC 11.6* 13.3* 15.2* 19.3*  HGB 8.0* 8.0* 8.4* 8.1*  HCT 22.5* 22.2* 22.8* 22.9*  MCV 89.6 90.6 91.2 90.5  PLT 71* 70* 71* 71*    Medications:    . cholecalciferol  2,000 Units Oral Daily  . darifenacin  7.5 mg Oral Daily  . feeding supplement  1 Container Oral TID BM  . feeding supplement (PRO-STAT SUGAR FREE 64)  30 mL Oral BID  . ferrous sulfate  325 mg Oral Q breakfast  . furosemide  40 mg Oral Daily  . lactulose  20 g Oral TID  . megestrol  40 mg Oral BID  . multivitamin with minerals  1 tablet Oral Daily  . predniSONE  20 mg Oral Q breakfast  . rifaximin  550 mg Oral BID      Otelia Santee, MD 12/15/2017, 10:25 AM

## 2017-12-15 NOTE — Progress Notes (Addendum)
   COVERING FOR DRS. MANN/HUNG  Progress Note   Subjective  Chief Complaint: Decompensated Cirrhosis  Pt continues to complain of generalized abd pain this morning, unchanged since admission rated as an 8-9/10. Worse when she moves around. Eating ok, regular bowel movements with Lactulose. No new complaints today.    Objective   Vital signs in last 24 hours: Temp:  [98.5 F (36.9 C)-99.1 F (37.3 C)] 98.5 F (36.9 C) (03/31 0530) Pulse Rate:  [80-85] 80 (03/31 0530) Resp:  [17-18] 18 (03/31 0530) BP: (134-143)/(56-59) 134/59 (03/31 0530) SpO2:  [96 %-99 %] 96 % (03/31 0530) Weight:  [267 lb 10.2 oz (121.4 kg)] 267 lb 10.2 oz (121.4 kg) (03/31 0536) Last BM Date: 12/15/17 General:   AA female in NAD Heart:  Regular rate and rhythm; no murmurs Lungs: Respirations even and unlabored, lungs CTA bilaterally Abdomen:  Soft, nontender and nondistended. Normal bowel sounds. Obvious ascites Neurologic:  Alert and oriented  Lab Results: Recent Labs    12/13/17 0523 12/14/17 0731  WBC 15.2* 19.3*  HGB 8.4* 8.1*  HCT 22.8* 22.9*  PLT 71* 71*   BMET Recent Labs    12/13/17 0523 12/14/17 0731 12/15/17 0349  NA 130* 129* 129*  K 4.7 4.9 5.0  CL 97* 98* 98*  CO2 22 23 23   GLUCOSE 144* 138* 116*  BUN 49* 50* 52*  CREATININE 2.00* 1.75* 1.75*  CALCIUM 8.6* 8.6* 8.6*   LFT Recent Labs    12/15/17 0349  PROT 5.8*  ALBUMIN 3.3*  AST 163*  ALT 75*  ALKPHOS 62  BILITOT 6.0*   Studies/Results: US Renal  Result Date: 12/13/2017 CLINICAL DATA:  Renal failure. EXAM: RENAL / URINARY TRACT ULTRASOUND COMPLETE COMPARISON:  Body CT 12/08/2017, abdominal ultrasound 12/07/2017 FINDINGS: Right Kidney: Length: 10.3 cm. Echogenicity within normal limits. No mass or hydronephrosis visualized. Left Kidney: Length: 12.5 cm. Echogenicity within normal limits. Benign-appearing cyst off of the superior pole of the left kidney measures 4.4 x 4.3 x 4.5 cm. Bladder: Appears normal for degree  of bladder distention. Moderate volume ascites. IMPRESSION: Benign-appearing left renal cyst, otherwise normal appearance of the kidneys. Moderate volume ascites. Electronically Signed   By: Fidela Salisbury M.D.   On: 12/13/2017 23:44    Assessment / Plan:   Assessment: 1.  Decompensated cirrhosis 2.  Renal insufficiency: crea same today 1.75-Nephrology on board 3.  Ascites 4.  Leukocytosis: Again likely from steroids  Plan: 1.  Continue to appreciate nephrology's recommendations, creatinine is same today 2.  Continue other supportive measures 3.  Please await further recommendations from Dr. Ardis Hughs later today  Thank you for your kind consultation, Drs. Mann/Hung will resume care tomorrow.   LOS: 9 days   Levin Erp  12/15/2017, 11:13 AM  Pager # [phone]   ________________________________________________________________________  Velora Heckler GI MD note:  I personally examined the patient, reviewed the data and agree with the assessment and plan described above.  Drs. Hung/Mann to resume care tomorrow.   Owens Loffler, MD [facility] Pager [phone]

## 2017-12-16 DIAGNOSIS — D6489 Other specified anemias: Secondary | ICD-10-CM

## 2017-12-16 LAB — PROTEIN ELECTROPHORESIS, SERUM
A/G RATIO SPE: 1.1 (ref 0.7–1.7)
ALBUMIN ELP: 3.4 g/dL (ref 2.9–4.4)
Alpha-1-Globulin: 0.1 g/dL (ref 0.0–0.4)
Alpha-2-Globulin: 0.4 g/dL (ref 0.4–1.0)
BETA GLOBULIN: 0.6 g/dL — AB (ref 0.7–1.3)
GAMMA GLOBULIN: 1.9 g/dL — AB (ref 0.4–1.8)
Globulin, Total: 3 g/dL (ref 2.2–3.9)
Total Protein ELP: 6.4 g/dL (ref 6.0–8.5)

## 2017-12-16 LAB — COMPREHENSIVE METABOLIC PANEL
ALBUMIN: 3.6 g/dL (ref 3.5–5.0)
ALT: 64 U/L — AB (ref 14–54)
AST: 129 U/L — AB (ref 15–41)
Alkaline Phosphatase: 63 U/L (ref 38–126)
Anion gap: 9 (ref 5–15)
BUN: 55 mg/dL — AB (ref 6–20)
CHLORIDE: 99 mmol/L — AB (ref 101–111)
CO2: 22 mmol/L (ref 22–32)
CREATININE: 1.72 mg/dL — AB (ref 0.44–1.00)
Calcium: 8.5 mg/dL — ABNORMAL LOW (ref 8.9–10.3)
GFR calc Af Amer: 36 mL/min — ABNORMAL LOW (ref >60)
GFR calc non Af Amer: 31 mL/min — ABNORMAL LOW (ref >60)
GLUCOSE: 123 mg/dL — AB (ref 65–99)
POTASSIUM: 5.2 mmol/L — AB (ref 3.5–5.1)
Sodium: 130 mmol/L — ABNORMAL LOW (ref 135–145)
Total Bilirubin: 7.4 mg/dL — ABNORMAL HIGH (ref 0.3–1.2)
Total Protein: 6.1 g/dL — ABNORMAL LOW (ref 6.5–8.1)

## 2017-12-16 LAB — CBC
HEMATOCRIT: 20.7 % — AB (ref 36.0–46.0)
Hemoglobin: 7.6 g/dL — ABNORMAL LOW (ref 12.0–15.0)
MCH: 33.8 pg (ref 26.0–34.0)
MCHC: 36.7 g/dL — AB (ref 30.0–36.0)
MCV: 92 fL (ref 78.0–100.0)
PLATELETS: 51 10*3/uL — AB (ref 150–400)
RBC: 2.25 MIL/uL — ABNORMAL LOW (ref 3.87–5.11)
RDW: 17.7 % — AB (ref 11.5–15.5)
WBC: 16.7 10*3/uL — ABNORMAL HIGH (ref 4.0–10.5)

## 2017-12-16 MED ORDER — PREDNISONE 10 MG PO TABS: ORAL_TABLET | ORAL | 0 refills | Status: DC

## 2017-12-16 MED ORDER — FUROSEMIDE 10 MG/ML IJ SOLN
40.0000 mg | Freq: Once | INTRAMUSCULAR | Status: AC
  Administered 2017-12-16: 40 mg via INTRAVENOUS
  Filled 2017-12-16: qty 4

## 2017-12-16 MED ORDER — FUROSEMIDE 40 MG PO TABS: 40.0000 mg | ORAL_TABLET | Freq: Every day | ORAL | 0 refills | Status: DC

## 2017-12-16 NOTE — Plan of Care (Signed)
  Problem: Clinical Measurements: Goal: Will remain free from infection Outcome: Progressing Goal: Diagnostic test results will improve Outcome: Progressing   

## 2017-12-16 NOTE — Progress Notes (Signed)
Physical Therapy Treatment Patient Details Name: Elizabeth Mclean MRN: [id number] DOB: 1956/09/17 Today's Date: 12/16/2017    History of Present Illness 62yo female with RUQ abdominal pain, diagnosed with acalculous cholecystitis. PMH OA, foot bone spur, hx breast CA, chronic pain, DM, hepatitis C, HTN     PT Comments    Pt functioning a mod I with ADLs and supervision with activity. Pt fatigues quickly requiring freq rest breaks. Discussed energy conservation techniques. Pt progressing well.   Follow Up Recommendations  No PT follow up     Equipment Recommendations  None recommended by PT    Recommendations for Other Services       Precautions / Restrictions Precautions Precautions: None Restrictions Weight Bearing Restrictions: No    Mobility  Bed Mobility Overal bed mobility: Modified Independent             General bed mobility comments: pt in bathroom performing bath  Transfers Overall transfer level: Needs assistance Equipment used: None(she pulled up on sink) Transfers: Sit to/from Stand Sit to Stand: Modified independent (Device/Increase time)         General transfer comment: pt completed all bathing with set up  Ambulation/Gait Ambulation/Gait assistance: Supervision Ambulation Distance (Feet): 550 Feet Assistive device: Rolling walker (2 wheeled) Gait Pattern/deviations: Step-through pattern Gait velocity: slow Gait velocity interpretation: Below normal speed for age/gender General Gait Details: 4 standing rest breaks/leaning elbows on walker, slow pace, no episodes of instability or falls   Stairs            Wheelchair Mobility    Modified Rankin (Stroke Patients Only)       Balance Overall balance assessment: Modified Independent Sitting-balance support: No upper extremity supported;Feet supported Sitting balance-Leahy Scale: Good     Standing balance support: Bilateral upper extremity supported;No upper extremity  supported;During functional activity Standing balance-Leahy Scale: Fair                              Cognition Arousal/Alertness: Awake/alert Behavior During Therapy: WFL for tasks assessed/performed Overall Cognitive Status: Within Functional Limits for tasks assessed                                 General Comments: Initially lethargic but awake/alert once participating with OT      Exercises      General Comments General comments (skin integrity, edema, etc.): pt able to perform bath and makeup/hair at sink from seated position. discussed energy conservation techniques and pt demo's ability to utilize them      Pertinent Vitals/Pain Pain Assessment: Faces Faces Pain Scale: Hurts little more Pain Location: abdomen, BLE Pain Descriptors / Indicators: Discomfort;Grimacing Pain Intervention(s): Limited activity within patient's tolerance    Home Living                      Prior Function            PT Goals (current goals can now be found in the care plan section) Acute Rehab PT Goals Patient Stated Goal: to regain independence  Progress towards PT goals: Progressing toward goals    Frequency    Min 3X/week      PT Plan Discharge plan needs to be updated    Co-evaluation              AM-PAC PT "6 Clicks" Daily Activity  Outcome  Measure  Difficulty turning over in bed (including adjusting bedclothes, sheets and blankets)?: None Difficulty moving from lying on back to sitting on the side of the bed? : None Difficulty sitting down on and standing up from a chair with arms (e.g., wheelchair, bedside commode, etc,.)?: None Help needed moving to and from a bed to chair (including a wheelchair)?: A Little Help needed walking in hospital room?: A Little Help needed climbing 3-5 steps with a railing? : A Little 6 Click Score: 21    End of Session   Activity Tolerance: Patient tolerated treatment well Patient left: in  chair;with call bell/phone within reach Nurse Communication: Mobility status PT Visit Diagnosis: Unsteadiness on feet (R26.81);Muscle weakness (generalized) (M62.81);Difficulty in walking, not elsewhere classified (R26.2)     Time: 3888-7579 PT Time Calculation (min) (ACUTE ONLY): 37 min  Charges:  $Gait Training: 8-22 mins $Therapeutic Activity: 8-22 mins                    G Codes:       Kittie Plater, PT, DPT Pager #: [phone] Office #: [phone]    [place] 12/16/2017, 1:38 PM

## 2017-12-16 NOTE — Progress Notes (Signed)
Subjective:  No acute events overnight. Patient denies abdominal pain this am. Appetite picking up.   Objective:  Vital signs in last 24 hours: Vitals:   12/15/17 0536 12/15/17 1445 12/15/17 2107 12/16/17 0430  BP:  (!) 130/52 (!) 135/57 (!) 137/56  Pulse:  85 83 85  Resp:   17 17  Temp:  99.2 F (37.3 C) 98.5 F (36.9 C) 99.3 F (37.4 C)  TempSrc:  Tympanic Oral Oral  SpO2:  99% 99% 95%  Weight: 267 lb 10.2 oz (121.4 kg)   274 lb 11.1 oz (124.6 kg)  Height:       Physical Exam  Constitutional: She is oriented to person, place, and time.  Obese, chronically ill appearing female sitting up in bed in no acute distress   Cardiovascular: Normal rate and regular rhythm. Exam reveals no gallop and no friction rub.  Murmur (II/VI systolicm murmur unchanged ) heard. Pulmonary/Chest: No respiratory distress. She has no wheezes. She has rales (bibasilar).  Abdominal:  Abdomen is obese and distended. minimally tender to palpation. Normoactive bowel sounds.   Musculoskeletal: She exhibits edema (2-3+ LE pitting edema bilaterally ).  Neurological: She is alert and oriented to person, place, and time.   BMP Latest Ref Rng & Units 12/16/2017 12/15/2017 12/14/2017  Glucose 65 - 99 mg/dL 123(H) 116(H) 138(H)  BUN 6 - 20 mg/dL 55(H) 52(H) 50(H)  Creatinine 0.44 - 1.00 mg/dL 1.72(H) 1.75(H) 1.75(H)  Sodium 135 - 145 mmol/L 130(L) 129(L) 129(L)  Potassium 3.5 - 5.1 mmol/L 5.2(H) 5.0 4.9  Chloride 101 - 111 mmol/L 99(L) 98(L) 98(L)  CO2 22 - 32 mmol/L 22 23 23   Calcium 8.9 - 10.3 mg/dL 8.5(L) 8.6(L) 8.6(L)   CBC Latest Ref Rng & Units 12/16/2017 12/14/2017 12/13/2017  WBC 4.0 - 10.5 K/uL 16.7(H) 19.3(H) 15.2(H)  Hemoglobin 12.0 - 15.0 g/dL 7.6(L) 8.1(L) 8.4(L)  Hematocrit 36.0 - 46.0 % 20.7(L) 22.9(L) 22.8(L)  Platelets 150 - 400 K/uL 51(L) 71(L) 71(L)    Assessment/Plan:  Principal Problem:   Upper abdominal pain Active Problems:   HYPERTENSION, BENIGN ESSENTIAL, CONTROLLED   Liver  cirrhosis (HCC)   Hyperkalemia   AKI (acute kidney injury) ([facility])   Ascites   Acute on chronic anemia   CKD (chronic kidney disease) stage 3, GFR 30-59 ml/min (HCC)  Upper abdominal pain: Unclear etiology. Initial concern for acalculous cholecystitis due to gallbladder wall thickening on imaging and started on Zosyn (3/22-3/28). Paracentesis ordered to rule out SBP, but not enough ascitic fluid to remove per IR. Trial of Medrol dose pack started with initial improvement in pain. However, abdominal worsen when PO solumedrol was tapered and she was started on low dose prednisone on 3/28. Abdominal pain improved since starting prednisone. Will continue to work with GI.  - GI following, appreciate assistance and recommendations.  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 No history of SBP.Meld score of 30 on presentation.LFTs and TBilistable. Hyperkalemic, volume overloaded -Gave additional 40mg  lasix IV to 40mg  lasix PO - Continue 25% concentrated albumin 12.5 g TID  - Continue home rifaximinandlactulose 20 g TID with goal of 2-3 BM per day  -Spironolactone discontinued by GI in setting of poor renal function - Continue home lactulose20 g TID  Hepatorenal syndrome CKD Stage III  Baseline  renal function unclear, appears to be worsening since 09/2017.Prior to this it was ~1.Renal function improved and stable at 1.7 for past 2 days.Nephrology consulted per GI request. Concern for HRS vs interstitial nephritis given new pyuria on UA vs progression of CKD.  Renal ultrasound order to evaluate kidney size and cortical thickness which showed benign appearing left renal cysts and moderate ascites, but otherwise unremarkable.  Per nephrology, plan for  outpatient renal biopsy as this appears to be progression of renal disease.  RF elevated at 23.3 and C3/C4 low. Workup as below.  - Nephrology following, appreciate assistance and recommendations - paraprotein workup pending  no proteinuria on UA  - Continue 25% concentrated albumin 12.5 g TID  -Spironolactone discontinued by GI in setting of poor renal function -Continue albumin infusion as above   Acute on chronic anemia:stable -Continue home ferrous sulfate  Overactive bladder -Darifenacin(home [employer] not in formulary)   Dispo: Anticipated discharge in approximately later today vs tomorrow.   Katherine Roan, MD 12/16/2017, 8:11 AM Vickki Muff MD PGY-1 Internal Medicine Pager # [phone]

## 2017-12-16 NOTE — Progress Notes (Signed)
Occupational Therapy Treatment Patient Details Name: Elizabeth Mclean MRN: [id number] DOB: 1956/02/29 Today's Date: 12/16/2017    History of present illness 62yo female with RUQ abdominal pain, diagnosed with acalculous cholecystitis. PMH OA, foot bone spur, hx breast CA, chronic pain, DM, hepatitis C, HTN    OT comments  Pt demonstrating improvement toward OT goals this session. She was able to complete LB dressing tasks with supervision when educated concerning use of reacher and sock aide. Pt requiring encouragement initially to participate with therapy session. However, she is demonstrating good functional mobility skills and engaged well with education concerning energy conservation. D/C recommendation remains appropriate.    Follow Up Recommendations  Home health OT;Supervision - Intermittent    Equipment Recommendations  Tub/shower seat;Other (comment)(AE kit for LB ADL)    Recommendations for Other Services      Precautions / Restrictions Precautions Precautions: None Restrictions Weight Bearing Restrictions: No       Mobility Bed Mobility Overal bed mobility: Modified Independent                Transfers Overall transfer level: Needs assistance Equipment used: Rolling walker (2 wheeled) Transfers: Sit to/from Stand Sit to Stand: Supervision         General transfer comment: Supervision for safety.     Balance Overall balance assessment: Needs assistance Sitting-balance support: No upper extremity supported;Feet supported Sitting balance-Leahy Scale: Good     Standing balance support: Bilateral upper extremity supported;No upper extremity supported;During functional activity Standing balance-Leahy Scale: Fair                             ADL either performed or assessed with clinical judgement   ADL Overall ADL's : Needs assistance/impaired             Lower Body Bathing: Supervison/ safety;With adaptive equipment;Sit to/from  stand       Lower Body Dressing: Supervision/safety;With adaptive equipment;Sit to/from stand               Functional mobility during ADLs: Supervision/safety;Rolling walker General ADL Comments: Unable to reach to feet for LB dressing and bathing tasks. Educated pt concerning safe use of AE for LB ADL and she verbalizes understanding.      Vision       Perception     Praxis      Cognition Arousal/Alertness: Awake/alert Behavior During Therapy: WFL for tasks assessed/performed Overall Cognitive Status: Within Functional Limits for tasks assessed                                 General Comments: Initially lethargic but awake/alert once participating with OT        Exercises     Shoulder Instructions       General Comments Pt emotional during session becoming tearful at times but did not share with OT what was concerning her.     Pertinent Vitals/ Pain       Pain Assessment: Faces Faces Pain Scale: Hurts little more Pain Location: abdomen, BLE Pain Descriptors / Indicators: Discomfort;Grimacing Pain Intervention(s): Limited activity within patient's tolerance;Monitored during session;Repositioned  Home Living                                          Prior Functioning/Environment  Frequency  Min 2X/week        Progress Toward Goals  OT Goals(current goals can now be found in the care plan section)  Progress towards OT goals: Progressing toward goals  Acute Rehab OT Goals Patient Stated Goal: to regain independence  OT Goal Formulation: With patient Time For Goal Achievement: 12/27/17 Potential to Achieve Goals: Good ADL Goals Pt Will Perform Lower Body Bathing: with modified independence;with adaptive equipment;sit to/from stand Pt Will Perform Lower Body Dressing: with modified independence;with adaptive equipment;sit to/from stand  Plan Discharge plan remains appropriate    Co-evaluation                  AM-PAC PT "6 Clicks" Daily Activity     Outcome Measure   Help from another person eating meals?: None Help from another person taking care of personal grooming?: None Help from another person toileting, which includes using toliet, bedpan, or urinal?: A Little Help from another person bathing (including washing, rinsing, drying)?: A Little Help from another person to put on and taking off regular upper body clothing?: None Help from another person to put on and taking off regular lower body clothing?: A Little 6 Click Score: 21    End of Session Equipment Utilized During Treatment: Rolling walker  OT Visit Diagnosis: Muscle weakness (generalized) (M62.81)   Activity Tolerance Patient tolerated treatment well   Patient Left in chair;with call bell/phone within reach   Nurse Communication          Time: 0370-4888 OT Time Calculation (min): 19 min  Charges: OT General Charges $OT Visit: 1 Visit OT Treatments $Self Care/Home Management : 8-22 mins  Norman Herrlich, MS OTR/L  Pager: [place] [facility]  12/16/2017, 10:21 AM

## 2017-12-16 NOTE — Progress Notes (Signed)
S: No new complaints O:BP (!) 137/56 (BP Location: Right Arm)   Pulse 85   Temp 99.3 F (37.4 C) (Oral)   Resp 17   Ht 5\' 7"  (1.702 m)   Wt 124.6 kg (274 lb 11.1 oz)   LMP 06/04/2017 (LMP Unknown)   SpO2 95%   BMI 43.02 kg/m   Intake/Output Summary (Last 24 hours) at 12/16/2017 1324 Last data filed at 12/16/2017 1236 Gross per 24 hour  Intake 820 ml  Output 1900 ml  Net -1080 ml   Intake/Output: I/O last 3 completed shifts: In: 620 [P.O.:620] Out: 1500 [Urine:1500]  Intake/Output this shift:  Total I/O In: 200 [P.O.:200] Out: 800 [Urine:800] Weight change: 3.2 kg (7 lb 0.9 oz) Gen: NAD CVS: no rub Resp: decreased BS at bases Abd: distended Ext: 1+ edema  Recent Labs  Lab 12/10/17 0504 12/11/17 0414 12/12/17 0410 12/13/17 0523 12/14/17 0731 12/15/17 0349 12/16/17 0353  NA 131* 131* 131* 130* 129* 129* 130*  K 4.3 4.4 4.6 4.7 4.9 5.0 5.2*  CL 100* 98* 99* 97* 98* 98* 99*  CO2 22 24 23 22 23 23 22   GLUCOSE 119* 126* 141* 144* 138* 116* 123*  BUN 40* 39* 44* 49* 50* 52* 55*  CREATININE 1.90* 1.99* 2.00* 2.00* 1.75* 1.75* 1.72*  ALBUMIN 2.9* 3.0* 3.0* 3.1* 3.2* 3.3* 3.6  CALCIUM 9.0 9.0 8.5* 8.6* 8.6* 8.6* 8.5*  AST 72* 73* 70* 105* 186* 163* 129*  ALT 44 43 41 50 78* 75* 64*   Liver Function Tests: Recent Labs  Lab 12/14/17 0731 12/15/17 0349 12/16/17 0353  AST 186* 163* 129*  ALT 78* 75* 64*  ALKPHOS 73 62 63  BILITOT 5.3* 6.0* 7.4*  PROT 5.9* 5.8* 6.1*  ALBUMIN 3.2* 3.3* 3.6   No results for input(s): LIPASE, AMYLASE in the last 168 hours. No results for input(s): AMMONIA in the last 168 hours. CBC: Recent Labs  Lab 12/11/17 0414 12/12/17 0410 12/13/17 0523 12/14/17 0731 12/16/17 0353  WBC 11.6* 13.3* 15.2* 19.3* 16.7*  HGB 8.0* 8.0* 8.4* 8.1* 7.6*  HCT 22.5* 22.2* 22.8* 22.9* 20.7*  MCV 89.6 90.6 91.2 90.5 92.0  PLT 71* 70* 71* 71* 51*   Cardiac Enzymes: No results for input(s): CKTOTAL, CKMB, CKMBINDEX, TROPONINI in the last 168  hours. CBG: No results for input(s): GLUCAP in the last 168 hours.  Iron Studies: No results for input(s): IRON, TIBC, TRANSFERRIN, FERRITIN in the last 72 hours. Studies/Results: No results found. . cholecalciferol  2,000 Units Oral Daily  . darifenacin  7.5 mg Oral Daily  . feeding supplement  1 Container Oral TID BM  . feeding supplement (PRO-STAT SUGAR FREE 64)  30 mL Oral BID  . ferrous sulfate  325 mg Oral Q breakfast  . lactulose  20 g Oral TID  . megestrol  40 mg Oral BID  . multivitamin with minerals  1 tablet Oral Daily  . predniSONE  20 mg Oral Q breakfast  . rifaximin  550 mg Oral BID    BMET    Component Value Date/Time   NA 130 (L) 12/16/2017 0353   NA 138 07/22/2014 0917   K 5.2 (H) 12/16/2017 0353   K 4.3 07/22/2014 0917   CL 99 (L) 12/16/2017 0353   CL 108 (H) 01/26/2013 0914   CO2 22 12/16/2017 0353   CO2 27 07/22/2014 0917   GLUCOSE 123 (H) 12/16/2017 0353   GLUCOSE 228 (H) 07/22/2014 0917   GLUCOSE 131 (H) 01/26/2013 9379  BUN 55 (H) 12/16/2017 0353   BUN 10.2 07/22/2014 0917   CREATININE 1.72 (H) 12/16/2017 0353   CREATININE 0.8 07/22/2014 0917   CALCIUM 8.5 (L) 12/16/2017 0353   CALCIUM 8.6 07/22/2014 0917   GFRNONAA 31 (L) 12/16/2017 0353   GFRAA 36 (L) 12/16/2017 0353   CBC    Component Value Date/Time   WBC 16.7 (H) 12/16/2017 0353   RBC 2.25 (L) 12/16/2017 0353   HGB 7.6 (L) 12/16/2017 0353   HGB 12.5 07/22/2014 0916   HCT 20.7 (L) 12/16/2017 0353   HCT 37.8 07/22/2014 0916   PLT 51 (L) 12/16/2017 0353   PLT 112 (L) 07/22/2014 0916   MCV 92.0 12/16/2017 0353   MCV 98.8 07/22/2014 0916   MCH 33.8 12/16/2017 0353   MCHC 36.7 (H) 12/16/2017 0353   RDW 17.7 (H) 12/16/2017 0353   RDW 14.4 07/22/2014 0916   LYMPHSABS 1.0 12/06/2017 0553   LYMPHSABS 1.5 07/22/2014 0916   MONOABS 0.5 12/06/2017 0553   MONOABS 0.5 07/22/2014 0916   EOSABS 0.0 12/06/2017 0553   EOSABS 0.0 07/22/2014 0916   BASOSABS 0.0 12/06/2017 0553   BASOSABS 0.0  07/22/2014 0916   Brief HPI:  62 y.o.femalewith hepatitis C, cirrhosis, hypertension, diabetes, GERD, breast cancer in 2011, chronic pain, depression p/wepigastric+right upper quadrant abdominal pain. She alsohas had intermittent nausea +vomiting. Denies having any fevers, chills, diarrhea, or constipation. She is followed by Dr. Almyra Free (GI) w/ recentincreaseinthe dose of her Lasix and spironolactone 4 days prior to this admission.  She was treated with Zosynfor cholecystitisand diuresis w/ weights decr from 124.3kg to 116.7kg on 12/12/2017 before 118.9kgon 3/29.   Cr was ~1-1.6 in late 2018 into early 2018 and has fluctuated from an admission Cr of 2.43 to a creatinine as high as 2.6 on 3/24 before decreasing to 1.9.    Assessment/Plan:  1. AKI/CKD stage 3- now nearing her baseline.  AKI likely due to intravascular volume depletion due to cirrhosis and escalation of diuresis but also on DDx is HRS or AIN.  Continue to follow UOP and Scr.  Hold off on renal biopsy due to thrombocytopenia and improving renal function.  Aldactone on hold and was given one dose of lasix today.  May worsen pre-renal azotemia as she is likely intrasvascularly dry.  Continue to follow. 2. Cirrhosis due to Hep C- t. Bili rising but AST improving.  GI following 3. Hyperkalemia- stable but may need kayexalate if remains elevated. Off aldactone but given lasix today.  Recheck tomorrow. 4. Hyponatremia- due to cirrhosis.  Follow 5. Thrombocytopenia- follow.  Donetta Potts, MD [employer] [phone]

## 2017-12-16 NOTE — Progress Notes (Signed)
Discharge home. Home discharge instruction given, no question verbalized. 

## 2017-12-19 ENCOUNTER — Emergency Department (HOSPITAL_COMMUNITY)
Admission: EM | Admit: 2017-12-19 | Discharge: 2017-12-20 | Disposition: A | Discharge status: Home / Self Care | Payer: Medicaid Other | Attending: Emergency Medicine | Admitting: Emergency Medicine

## 2017-12-19 ENCOUNTER — Encounter (HOSPITAL_COMMUNITY): Payer: Self-pay | Admitting: Emergency Medicine

## 2017-12-19 DIAGNOSIS — Y998 Other external cause status: Secondary | ICD-10-CM | POA: Insufficient documentation

## 2017-12-19 DIAGNOSIS — E1122 Type 2 diabetes mellitus with diabetic chronic kidney disease: Secondary | ICD-10-CM | POA: Diagnosis not present

## 2017-12-19 DIAGNOSIS — Y929 Unspecified place or not applicable: Secondary | ICD-10-CM | POA: Insufficient documentation

## 2017-12-19 DIAGNOSIS — Y9389 Activity, other specified: Secondary | ICD-10-CM | POA: Diagnosis not present

## 2017-12-19 DIAGNOSIS — N183 Chronic kidney disease, stage 3 (moderate): Secondary | ICD-10-CM | POA: Insufficient documentation

## 2017-12-19 DIAGNOSIS — Z79899 Other long term (current) drug therapy: Secondary | ICD-10-CM | POA: Insufficient documentation

## 2017-12-19 DIAGNOSIS — X16XXXA Contact with hot heating appliances, radiators and pipes, initial encounter: Secondary | ICD-10-CM | POA: Diagnosis not present

## 2017-12-19 DIAGNOSIS — T24211A Burn of second degree of right thigh, initial encounter: Secondary | ICD-10-CM | POA: Insufficient documentation

## 2017-12-19 DIAGNOSIS — M25551 Pain in right hip: Secondary | ICD-10-CM | POA: Insufficient documentation

## 2017-12-19 DIAGNOSIS — Z23 Encounter for immunization: Secondary | ICD-10-CM | POA: Diagnosis not present

## 2017-12-19 DIAGNOSIS — Z853 Personal history of malignant neoplasm of breast: Secondary | ICD-10-CM | POA: Insufficient documentation

## 2017-12-19 DIAGNOSIS — Z87891 Personal history of nicotine dependence: Secondary | ICD-10-CM | POA: Diagnosis not present

## 2017-12-19 DIAGNOSIS — I129 Hypertensive chronic kidney disease with stage 1 through stage 4 chronic kidney disease, or unspecified chronic kidney disease: Secondary | ICD-10-CM | POA: Diagnosis not present

## 2017-12-19 DIAGNOSIS — T300 Burn of unspecified body region, unspecified degree: Secondary | ICD-10-CM
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
## 2017-12-20 MED ORDER — TETANUS-DIPHTH-ACELL PERTUSSIS 5-2.5-18.5 LF-MCG/0.5 IM SUSP
0.5000 mL | Freq: Once | INTRAMUSCULAR | Status: AC
  Administered 2017-12-20: 0.5 mL via INTRAMUSCULAR
  Filled 2017-12-20: qty 0.5

## 2017-12-20 MED ORDER — TRAMADOL HCL 50 MG PO TABS: 50.0000 mg | ORAL_TABLET | Freq: Four times a day (QID) | ORAL | 0 refills | Status: DC | PRN

## 2017-12-20 NOTE — Discharge Instructions (Addendum)
Keep the area clean with warm water and soap.  You can gently slough of the skin if you are using a washcloth but do not use a lot of pressure.  Pat the area dry then apply a thin layer of a topical antibiotic such as bacitracin or Neosporin.  After applying the topical antibiotic, apply a clean bandage to the area.  Please change the bandages frequently as needed if the bandage gets soiled.  Make sure to change it at least once daily.  Call and schedule follow-up appointment with Elizabeth Mclean on Monday or Tuesday for reevaluation.  Take 1 tablet of tramadol every 6 hours as needed for severe pain.  For mild to moderate pain he can use a cool compress over the area.  If you develop new or worsening symptoms including fever, chills, or if the skin around the wounds becomes red, hot to the touch, or swollen, please return to the emergency department for re-evaluation.

## 2017-12-22 ENCOUNTER — Encounter (HOSPITAL_COMMUNITY): Payer: Self-pay | Admitting: Emergency Medicine

## 2017-12-22 ENCOUNTER — Emergency Department (HOSPITAL_COMMUNITY)
Admission: EM | Admit: 2017-12-22 | Discharge: 2017-12-22 | Disposition: A | Discharge status: Home / Self Care | Payer: Medicaid Other | Attending: Emergency Medicine | Admitting: Emergency Medicine

## 2017-12-22 ENCOUNTER — Other Ambulatory Visit: Payer: Self-pay

## 2017-12-22 ENCOUNTER — Emergency Department (HOSPITAL_BASED_OUTPATIENT_CLINIC_OR_DEPARTMENT_OTHER): Admit: 2017-12-22 | Discharge: 2017-12-22 | Disposition: A | Discharge status: Home / Self Care | Payer: Medicaid Other

## 2017-12-22 DIAGNOSIS — I129 Hypertensive chronic kidney disease with stage 1 through stage 4 chronic kidney disease, or unspecified chronic kidney disease: Secondary | ICD-10-CM | POA: Diagnosis not present

## 2017-12-22 DIAGNOSIS — N183 Chronic kidney disease, stage 3 (moderate): Secondary | ICD-10-CM | POA: Insufficient documentation

## 2017-12-22 DIAGNOSIS — Z79899 Other long term (current) drug therapy: Secondary | ICD-10-CM | POA: Diagnosis not present

## 2017-12-22 DIAGNOSIS — Z87891 Personal history of nicotine dependence: Secondary | ICD-10-CM | POA: Diagnosis not present

## 2017-12-22 DIAGNOSIS — M79609 Pain in unspecified limb: Secondary | ICD-10-CM | POA: Diagnosis not present

## 2017-12-22 DIAGNOSIS — E1122 Type 2 diabetes mellitus with diabetic chronic kidney disease: Secondary | ICD-10-CM | POA: Diagnosis not present

## 2017-12-22 DIAGNOSIS — Z853 Personal history of malignant neoplasm of breast: Secondary | ICD-10-CM | POA: Diagnosis not present

## 2017-12-22 DIAGNOSIS — M79602 Pain in left arm: Secondary | ICD-10-CM | POA: Diagnosis present

## 2017-12-22 MED ORDER — HYDROMORPHONE HCL 1 MG/ML IJ SOLN
1.0000 mg | Freq: Once | INTRAMUSCULAR | Status: AC
  Administered 2017-12-22: 1 mg via INTRAMUSCULAR
  Filled 2017-12-22: qty 1

## 2017-12-22 MED ORDER — OXYCODONE-ACETAMINOPHEN 5-325 MG PO TABS: 1.0000 | ORAL_TABLET | ORAL | 0 refills | Status: DC | PRN

## 2017-12-22 MED ORDER — LORAZEPAM 2 MG/ML IJ SOLN
1.0000 mg | Freq: Once | INTRAMUSCULAR | Status: DC
  Filled 2017-12-22: qty 1

## 2017-12-22 MED ORDER — LORAZEPAM 2 MG/ML IJ SOLN
1.0000 mg | Freq: Once | INTRAMUSCULAR | Status: AC
  Administered 2017-12-22: 1 mg via INTRAVENOUS
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
## 2017-12-22 NOTE — ED Provider Notes (Signed)
I assumed care of this patient from Dr. Wilson Singer at [phone].  Please see their note for further details of Hx, PE.  Briefly patient is a 62 y.o. female who presented with left arm pain. Not suspicious for septic arthrtitis. No trauma. DVT suspected.   Current plan is to US DVT study.  [place] negative.   The patient is safe for discharge with strict return precautions.  Disposition: Discharge  Condition: Good  I have discussed the results, Dx and Tx plan with the patient who expressed understanding and agree(s) with the plan. Discharge instructions discussed at great length. The patient was given strict return precautions who verbalized understanding of the instructions. No further questions at time of discharge.    ED Discharge Orders        Ordered    oxyCODONE-acetaminophen (PERCOCET/ROXICET) 5-325 MG tablet  Every 4 hours PRN     12/22/17 1632       Follow Up: Kerin Perna, NP [place] [address] [phone]         Fatima Blank, MD 12/23/17 1550

## 2017-12-22 NOTE — Progress Notes (Signed)
VASCULAR LAB PRELIMINARY  PRELIMINARY  PRELIMINARY  PRELIMINARY  Left upper extremity venous duplex completed.    Preliminary report:  There is no obvious evidence of DVT or SVT noted in the visualized veins of the left upper extremity.  Patient would not extend arm secondary to significant pain.   Called results to Dr. Jhonnie Garner, [facility], RVT 12/22/2017, 6:15 PM

## 2017-12-22 NOTE — ED Notes (Signed)
This RN wasted 1mg  Ativan with Medical laboratory scientific officer, pt was already DC out of pyxis.

## 2017-12-22 NOTE — ED Triage Notes (Signed)
Pt arrives from home via [facility], reports LUE pain since Friday, worse with movement, reports some RUE pain.  EMS reports pt received tetanus injection in RUE on Thursday.  Pulse and sensation intact, pt denies weakness.  Pt tearful.

## 2017-12-23 ENCOUNTER — Other Ambulatory Visit: Payer: Self-pay

## 2017-12-23 ENCOUNTER — Inpatient Hospital Stay (HOSPITAL_COMMUNITY)
Admission: EM | Admit: 2017-12-23 | Discharge: 2017-12-27 | DRG: 432 | Disposition: A | Discharge status: Hospice | Payer: Medicaid Other | Attending: Internal Medicine | Admitting: Internal Medicine

## 2017-12-23 ENCOUNTER — Emergency Department (HOSPITAL_COMMUNITY): Payer: Medicaid Other

## 2017-12-23 DIAGNOSIS — D696 Thrombocytopenia, unspecified: Secondary | ICD-10-CM | POA: Diagnosis present

## 2017-12-23 DIAGNOSIS — I129 Hypertensive chronic kidney disease with stage 1 through stage 4 chronic kidney disease, or unspecified chronic kidney disease: Secondary | ICD-10-CM

## 2017-12-23 DIAGNOSIS — N179 Acute kidney failure, unspecified: Secondary | ICD-10-CM | POA: Diagnosis not present

## 2017-12-23 DIAGNOSIS — X16XXXA Contact with hot heating appliances, radiators and pipes, initial encounter: Secondary | ICD-10-CM | POA: Diagnosis present

## 2017-12-23 DIAGNOSIS — R188 Other ascites: Secondary | ICD-10-CM | POA: Diagnosis not present

## 2017-12-23 DIAGNOSIS — B1920 Unspecified viral hepatitis C without hepatic coma: Secondary | ICD-10-CM | POA: Diagnosis present

## 2017-12-23 DIAGNOSIS — I509 Heart failure, unspecified: Secondary | ICD-10-CM | POA: Diagnosis present

## 2017-12-23 DIAGNOSIS — N183 Chronic kidney disease, stage 3 (moderate): Secondary | ICD-10-CM | POA: Diagnosis not present

## 2017-12-23 DIAGNOSIS — E875 Hyperkalemia: Secondary | ICD-10-CM | POA: Diagnosis present

## 2017-12-23 DIAGNOSIS — K7290 Hepatic failure, unspecified without coma: Secondary | ICD-10-CM | POA: Diagnosis not present

## 2017-12-23 DIAGNOSIS — K219 Gastro-esophageal reflux disease without esophagitis: Secondary | ICD-10-CM

## 2017-12-23 DIAGNOSIS — T24211D Burn of second degree of right thigh, subsequent encounter: Secondary | ICD-10-CM

## 2017-12-23 DIAGNOSIS — D638 Anemia in other chronic diseases classified elsewhere: Secondary | ICD-10-CM | POA: Diagnosis present

## 2017-12-23 DIAGNOSIS — T2125XA Burn of second degree of buttock, initial encounter: Secondary | ICD-10-CM | POA: Diagnosis present

## 2017-12-23 DIAGNOSIS — I130 Hypertensive heart and chronic kidney disease with heart failure and stage 1 through stage 4 chronic kidney disease, or unspecified chronic kidney disease: Secondary | ICD-10-CM | POA: Diagnosis present

## 2017-12-23 DIAGNOSIS — Z79899 Other long term (current) drug therapy: Secondary | ICD-10-CM

## 2017-12-23 DIAGNOSIS — Z923 Personal history of irradiation: Secondary | ICD-10-CM

## 2017-12-23 DIAGNOSIS — Z87891 Personal history of nicotine dependence: Secondary | ICD-10-CM

## 2017-12-23 DIAGNOSIS — Z66 Do not resuscitate: Secondary | ICD-10-CM | POA: Diagnosis present

## 2017-12-23 DIAGNOSIS — Z515 Encounter for palliative care: Secondary | ICD-10-CM

## 2017-12-23 DIAGNOSIS — D649 Anemia, unspecified: Secondary | ICD-10-CM

## 2017-12-23 DIAGNOSIS — K767 Hepatorenal syndrome: Secondary | ICD-10-CM

## 2017-12-23 DIAGNOSIS — T300 Burn of unspecified body region, unspecified degree: Secondary | ICD-10-CM

## 2017-12-23 DIAGNOSIS — K7460 Unspecified cirrhosis of liver: Secondary | ICD-10-CM | POA: Diagnosis not present

## 2017-12-23 DIAGNOSIS — M545 Low back pain: Secondary | ICD-10-CM | POA: Diagnosis present

## 2017-12-23 DIAGNOSIS — Z885 Allergy status to narcotic agent status: Secondary | ICD-10-CM

## 2017-12-23 DIAGNOSIS — Z8249 Family history of ischemic heart disease and other diseases of the circulatory system: Secondary | ICD-10-CM

## 2017-12-23 DIAGNOSIS — F339 Major depressive disorder, recurrent, unspecified: Secondary | ICD-10-CM

## 2017-12-23 DIAGNOSIS — K7469 Other cirrhosis of liver: Principal | ICD-10-CM | POA: Diagnosis present

## 2017-12-23 DIAGNOSIS — Z888 Allergy status to other drugs, medicaments and biological substances status: Secondary | ICD-10-CM

## 2017-12-23 DIAGNOSIS — M25532 Pain in left wrist: Secondary | ICD-10-CM | POA: Diagnosis not present

## 2017-12-23 DIAGNOSIS — F419 Anxiety disorder, unspecified: Secondary | ICD-10-CM | POA: Diagnosis present

## 2017-12-23 DIAGNOSIS — E871 Hypo-osmolality and hyponatremia: Secondary | ICD-10-CM | POA: Diagnosis present

## 2017-12-23 DIAGNOSIS — E1122 Type 2 diabetes mellitus with diabetic chronic kidney disease: Secondary | ICD-10-CM | POA: Diagnosis present

## 2017-12-23 DIAGNOSIS — G8929 Other chronic pain: Secondary | ICD-10-CM

## 2017-12-23 DIAGNOSIS — Z833 Family history of diabetes mellitus: Secondary | ICD-10-CM

## 2017-12-23 DIAGNOSIS — F329 Major depressive disorder, single episode, unspecified: Secondary | ICD-10-CM | POA: Diagnosis present

## 2017-12-23 DIAGNOSIS — X16XXXD Contact with hot heating appliances, radiators and pipes, subsequent encounter: Secondary | ICD-10-CM | POA: Diagnosis not present

## 2017-12-23 DIAGNOSIS — J42 Unspecified chronic bronchitis: Secondary | ICD-10-CM | POA: Diagnosis present

## 2017-12-23 DIAGNOSIS — Z7189 Other specified counseling: Secondary | ICD-10-CM

## 2017-12-23 DIAGNOSIS — Z853 Personal history of malignant neoplasm of breast: Secondary | ICD-10-CM

## 2017-12-23 DIAGNOSIS — Z9111 Patient's noncompliance with dietary regimen: Secondary | ICD-10-CM

## 2017-12-23 DIAGNOSIS — J302 Other seasonal allergic rhinitis: Secondary | ICD-10-CM | POA: Diagnosis present

## 2017-12-23 DIAGNOSIS — Z886 Allergy status to analgesic agent status: Secondary | ICD-10-CM

## 2017-12-23 DIAGNOSIS — M1990 Unspecified osteoarthritis, unspecified site: Secondary | ICD-10-CM | POA: Diagnosis present

## 2017-12-23 HISTORY — DX: Chronic kidney disease, stage 3 unspecified: N18.30

## 2017-12-23 HISTORY — DX: Chronic kidney disease, stage 3 (moderate): N18.3

## 2017-12-23 LAB — CBC WITH DIFFERENTIAL/PLATELET
BASOS PCT: 0 %
Basophils Absolute: 0 10*3/uL (ref 0.0–0.1)
EOS PCT: 0 %
Eosinophils Absolute: 0 10*3/uL (ref 0.0–0.7)
HEMATOCRIT: 22.6 % — AB (ref 36.0–46.0)
HEMOGLOBIN: 8.1 g/dL — AB (ref 12.0–15.0)
LYMPHS PCT: 7 %
Lymphs Abs: 1.1 10*3/uL (ref 0.7–4.0)
MCH: 32.9 pg (ref 26.0–34.0)
MCHC: 35.8 g/dL (ref 30.0–36.0)
MCV: 91.9 fL (ref 78.0–100.0)
MONO ABS: 1.2 10*3/uL — AB (ref 0.1–1.0)
MONOS PCT: 8 %
NEUTROS PCT: 85 %
Neutro Abs: 12.9 10*3/uL — ABNORMAL HIGH (ref 1.7–7.7)
Platelets: 94 10*3/uL — ABNORMAL LOW (ref 150–400)
RBC: 2.46 MIL/uL — AB (ref 3.87–5.11)
RDW: 19.2 % — ABNORMAL HIGH (ref 11.5–15.5)
WBC: 15.2 10*3/uL — AB (ref 4.0–10.5)

## 2017-12-23 LAB — COMPREHENSIVE METABOLIC PANEL
ALBUMIN: 3.1 g/dL — AB (ref 3.5–5.0)
ALK PHOS: 70 U/L (ref 38–126)
ALT: 65 U/L — AB (ref 14–54)
AST: 87 U/L — AB (ref 15–41)
Anion gap: 11 (ref 5–15)
BILIRUBIN TOTAL: 15.6 mg/dL — AB (ref 0.3–1.2)
BUN: 73 mg/dL — AB (ref 6–20)
CALCIUM: 8.7 mg/dL — AB (ref 8.9–10.3)
CO2: 18 mmol/L — AB (ref 22–32)
CREATININE: 2.5 mg/dL — AB (ref 0.44–1.00)
Chloride: 99 mmol/L — ABNORMAL LOW (ref 101–111)
GFR calc Af Amer: 23 mL/min — ABNORMAL LOW (ref >60)
GFR calc non Af Amer: 20 mL/min — ABNORMAL LOW (ref >60)
GLUCOSE: 97 mg/dL (ref 65–99)
Potassium: 5.4 mmol/L — ABNORMAL HIGH (ref 3.5–5.1)
SODIUM: 128 mmol/L — AB (ref 135–145)
TOTAL PROTEIN: 6.7 g/dL (ref 6.5–8.1)

## 2017-12-23 LAB — I-STAT CG4 LACTIC ACID, ED: Lactic Acid, Venous: 2.13 mmol/L (ref 0.5–1.9)

## 2017-12-23 LAB — I-STAT TROPONIN, ED: Troponin i, poc: 0.06 ng/mL (ref 0.00–0.08)

## 2017-12-23 LAB — PROTIME-INR
INR: 2.99
PROTHROMBIN TIME: 30.8 s — AB (ref 11.4–15.2)

## 2017-12-23 LAB — BRAIN NATRIURETIC PEPTIDE: B Natriuretic Peptide: 118.5 pg/mL — ABNORMAL HIGH (ref 0.0–100.0)

## 2017-12-23 MED ORDER — FUROSEMIDE 10 MG/ML IJ SOLN
40.0000 mg | INTRAMUSCULAR | Status: AC
Start: 2017-12-23 — End: 2017-12-23
  Administered 2017-12-23: 40 mg via INTRAVENOUS
  Filled 2017-12-23: qty 4
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
## 2017-12-23 NOTE — ED Notes (Signed)
IV team at bedside 

## 2017-12-23 NOTE — Progress Notes (Signed)
Present to attempt PIV  Pt. In xray.  Will follow up later
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
## 2017-12-23 NOTE — ED Notes (Signed)
Unable access peripheral IV access despite several attempts , EDP notified and IV team consult ordered.

## 2017-12-24 ENCOUNTER — Inpatient Hospital Stay (HOSPITAL_COMMUNITY): Payer: Medicaid Other

## 2017-12-24 DIAGNOSIS — R011 Cardiac murmur, unspecified: Secondary | ICD-10-CM | POA: Diagnosis not present

## 2017-12-24 DIAGNOSIS — K767 Hepatorenal syndrome: Secondary | ICD-10-CM

## 2017-12-24 DIAGNOSIS — G8929 Other chronic pain: Secondary | ICD-10-CM | POA: Diagnosis present

## 2017-12-24 DIAGNOSIS — K713 Toxic liver disease with chronic persistent hepatitis: Secondary | ICD-10-CM | POA: Diagnosis not present

## 2017-12-24 DIAGNOSIS — I130 Hypertensive heart and chronic kidney disease with heart failure and stage 1 through stage 4 chronic kidney disease, or unspecified chronic kidney disease: Secondary | ICD-10-CM | POA: Diagnosis present

## 2017-12-24 DIAGNOSIS — J302 Other seasonal allergic rhinitis: Secondary | ICD-10-CM | POA: Diagnosis present

## 2017-12-24 DIAGNOSIS — I129 Hypertensive chronic kidney disease with stage 1 through stage 4 chronic kidney disease, or unspecified chronic kidney disease: Secondary | ICD-10-CM | POA: Diagnosis not present

## 2017-12-24 DIAGNOSIS — M25532 Pain in left wrist: Secondary | ICD-10-CM | POA: Diagnosis not present

## 2017-12-24 DIAGNOSIS — N183 Chronic kidney disease, stage 3 (moderate): Secondary | ICD-10-CM | POA: Diagnosis present

## 2017-12-24 DIAGNOSIS — E875 Hyperkalemia: Secondary | ICD-10-CM | POA: Diagnosis present

## 2017-12-24 DIAGNOSIS — B1920 Unspecified viral hepatitis C without hepatic coma: Secondary | ICD-10-CM | POA: Diagnosis not present

## 2017-12-24 DIAGNOSIS — K7469 Other cirrhosis of liver: Secondary | ICD-10-CM | POA: Diagnosis present

## 2017-12-24 DIAGNOSIS — X16XXXA Contact with hot heating appliances, radiators and pipes, initial encounter: Secondary | ICD-10-CM | POA: Diagnosis present

## 2017-12-24 DIAGNOSIS — J42 Unspecified chronic bronchitis: Secondary | ICD-10-CM | POA: Diagnosis present

## 2017-12-24 DIAGNOSIS — K7460 Unspecified cirrhosis of liver: Secondary | ICD-10-CM | POA: Diagnosis present

## 2017-12-24 DIAGNOSIS — K219 Gastro-esophageal reflux disease without esophagitis: Secondary | ICD-10-CM | POA: Diagnosis present

## 2017-12-24 DIAGNOSIS — M1990 Unspecified osteoarthritis, unspecified site: Secondary | ICD-10-CM | POA: Diagnosis present

## 2017-12-24 DIAGNOSIS — Z66 Do not resuscitate: Secondary | ICD-10-CM | POA: Diagnosis present

## 2017-12-24 DIAGNOSIS — R188 Other ascites: Secondary | ICD-10-CM | POA: Diagnosis present

## 2017-12-24 DIAGNOSIS — F329 Major depressive disorder, single episode, unspecified: Secondary | ICD-10-CM | POA: Diagnosis present

## 2017-12-24 DIAGNOSIS — I509 Heart failure, unspecified: Secondary | ICD-10-CM | POA: Diagnosis present

## 2017-12-24 DIAGNOSIS — Z886 Allergy status to analgesic agent status: Secondary | ICD-10-CM | POA: Diagnosis not present

## 2017-12-24 DIAGNOSIS — Z515 Encounter for palliative care: Secondary | ICD-10-CM | POA: Diagnosis not present

## 2017-12-24 DIAGNOSIS — B182 Chronic viral hepatitis C: Secondary | ICD-10-CM | POA: Diagnosis not present

## 2017-12-24 DIAGNOSIS — T2125XA Burn of second degree of buttock, initial encounter: Secondary | ICD-10-CM | POA: Diagnosis present

## 2017-12-24 DIAGNOSIS — F419 Anxiety disorder, unspecified: Secondary | ICD-10-CM | POA: Diagnosis present

## 2017-12-24 DIAGNOSIS — D631 Anemia in chronic kidney disease: Secondary | ICD-10-CM | POA: Diagnosis not present

## 2017-12-24 DIAGNOSIS — M545 Low back pain: Secondary | ICD-10-CM | POA: Diagnosis present

## 2017-12-24 DIAGNOSIS — N179 Acute kidney failure, unspecified: Secondary | ICD-10-CM | POA: Diagnosis present

## 2017-12-24 DIAGNOSIS — D696 Thrombocytopenia, unspecified: Secondary | ICD-10-CM | POA: Diagnosis present

## 2017-12-24 DIAGNOSIS — M25432 Effusion, left wrist: Secondary | ICD-10-CM | POA: Diagnosis not present

## 2017-12-24 DIAGNOSIS — Z7189 Other specified counseling: Secondary | ICD-10-CM | POA: Diagnosis not present

## 2017-12-24 DIAGNOSIS — E871 Hypo-osmolality and hyponatremia: Secondary | ICD-10-CM | POA: Diagnosis not present

## 2017-12-24 DIAGNOSIS — E1122 Type 2 diabetes mellitus with diabetic chronic kidney disease: Secondary | ICD-10-CM | POA: Diagnosis present

## 2017-12-24 LAB — COMPREHENSIVE METABOLIC PANEL
ALT: 55 U/L — ABNORMAL HIGH (ref 14–54)
ANION GAP: 11 (ref 5–15)
AST: 77 U/L — ABNORMAL HIGH (ref 15–41)
Albumin: 2.7 g/dL — ABNORMAL LOW (ref 3.5–5.0)
Alkaline Phosphatase: 72 U/L (ref 38–126)
BUN: 73 mg/dL — ABNORMAL HIGH (ref 6–20)
CHLORIDE: 100 mmol/L — AB (ref 101–111)
CO2: 20 mmol/L — AB (ref 22–32)
Calcium: 8.6 mg/dL — ABNORMAL LOW (ref 8.9–10.3)
Creatinine, Ser: 2.3 mg/dL — ABNORMAL HIGH (ref 0.44–1.00)
GFR calc Af Amer: 25 mL/min — ABNORMAL LOW (ref >60)
GFR calc non Af Amer: 22 mL/min — ABNORMAL LOW (ref >60)
GLUCOSE: 75 mg/dL (ref 65–99)
POTASSIUM: 5.3 mmol/L — AB (ref 3.5–5.1)
SODIUM: 131 mmol/L — AB (ref 135–145)
TOTAL PROTEIN: 5.7 g/dL — AB (ref 6.5–8.1)
Total Bilirubin: 14 mg/dL — ABNORMAL HIGH (ref 0.3–1.2)

## 2017-12-24 LAB — UPEP/UIFE/LIGHT CHAINS/TP, 24-HR UR
% BETA, Urine: 0 %
ALPHA 1 URINE: 0 %
Albumin, U: 100 %
Alpha 2, Urine: 0 %
Free Kappa Lt Chains,Ur: 11.1 mg/L (ref 1.35–24.19)
Free Kappa/Lambda Ratio: 17.34 — ABNORMAL HIGH (ref 2.04–10.37)
Free Lambda Lt Chains,Ur: 0.64 mg/L (ref 0.24–6.66)
GAMMA GLOBULIN URINE: 0 %
TOTAL PROTEIN, URINE-UR/DAY: 276 mg/(24.h) — AB (ref 30–150)
Total Protein, Urine: 19.7 mg/dL
Total Volume: 1400

## 2017-12-24 LAB — CBC
HEMATOCRIT: 20.4 % — AB (ref 36.0–46.0)
HEMOGLOBIN: 7.5 g/dL — AB (ref 12.0–15.0)
MCH: 33.8 pg (ref 26.0–34.0)
MCHC: 36.8 g/dL — AB (ref 30.0–36.0)
MCV: 91.9 fL (ref 78.0–100.0)
Platelets: 75 10*3/uL — ABNORMAL LOW (ref 150–400)
RBC: 2.22 MIL/uL — ABNORMAL LOW (ref 3.87–5.11)
RDW: 19.3 % — ABNORMAL HIGH (ref 11.5–15.5)
WBC: 13 10*3/uL — ABNORMAL HIGH (ref 4.0–10.5)

## 2017-12-24 LAB — GLUCOSE, CAPILLARY: GLUCOSE-CAPILLARY: 92 mg/dL (ref 65–99)

## 2017-12-24 MED ORDER — LACTULOSE 10 GM/15ML PO SOLN
20.0000 g | Freq: Three times a day (TID) | ORAL | Status: DC
  Administered 2017-12-24 – 2017-12-25 (×4): 20 g via ORAL
  Filled 2017-12-24 (×4): qty 30

## 2017-12-24 MED ORDER — FUROSEMIDE 80 MG PO TABS
80.0000 mg | ORAL_TABLET | Freq: Every day | ORAL | Status: DC
  Administered 2017-12-25 – 2017-12-27 (×3): 80 mg via ORAL
  Filled 2017-12-24 (×3): qty 1

## 2017-12-24 MED ORDER — RIFAXIMIN 550 MG PO TABS
550.0000 mg | ORAL_TABLET | Freq: Two times a day (BID) | ORAL | Status: DC
  Administered 2017-12-24 – 2017-12-25 (×3): 550 mg via ORAL
  Filled 2017-12-24 (×3): qty 1

## 2017-12-24 MED ORDER — FUROSEMIDE 10 MG/ML IJ SOLN: 40.0000 mg | Freq: Every day | INTRAMUSCULAR | Status: DC

## 2017-12-24 MED ORDER — DARIFENACIN HYDROBROMIDE ER 7.5 MG PO TB24
7.5000 mg | ORAL_TABLET | Freq: Every day | ORAL | Status: DC
  Administered 2017-12-24 – 2017-12-27 (×4): 7.5 mg via ORAL
  Filled 2017-12-24 (×4): qty 1

## 2017-12-24 MED ORDER — ALBUMIN HUMAN 25 % IV SOLN
12.5000 g | Freq: Once | INTRAVENOUS | Status: AC
  Administered 2017-12-24: 12.5 g via INTRAVENOUS
  Filled 2017-12-24: qty 50

## 2017-12-24 MED ORDER — SPIRONOLACTONE 100 MG PO TABS: 100.0000 mg | ORAL_TABLET | Freq: Every day | ORAL | Status: DC

## 2017-12-24 MED ORDER — SILVER SULFADIAZINE 1 % EX CREA
TOPICAL_CREAM | Freq: Two times a day (BID) | CUTANEOUS | Status: DC
  Administered 2017-12-24 (×2): via TOPICAL
  Administered 2017-12-25 – 2017-12-26 (×3): 1 via TOPICAL
  Administered 2017-12-26 – 2017-12-27 (×2): via TOPICAL
  Filled 2017-12-24: qty 85

## 2017-12-24 MED ORDER — FERROUS SULFATE 325 (65 FE) MG PO TABS
325.0000 mg | ORAL_TABLET | Freq: Every day | ORAL | Status: DC
  Administered 2017-12-24 – 2017-12-27 (×4): 325 mg via ORAL
  Filled 2017-12-24 (×4): qty 1

## 2017-12-24 MED ORDER — FUROSEMIDE 80 MG PO TABS
80.0000 mg | ORAL_TABLET | Freq: Two times a day (BID) | ORAL | Status: DC
  Administered 2017-12-24: 80 mg via ORAL
  Filled 2017-12-24: qty 1

## 2017-12-24 MED ORDER — ONDANSETRON HCL 4 MG PO TABS
4.0000 mg | ORAL_TABLET | Freq: Four times a day (QID) | ORAL | Status: DC | PRN
  Administered 2017-12-25: 4 mg via ORAL
  Filled 2017-12-24: qty 1

## 2017-12-24 MED ORDER — HYDROXYZINE HCL 25 MG PO TABS
25.0000 mg | ORAL_TABLET | Freq: Three times a day (TID) | ORAL | Status: DC | PRN
  Administered 2017-12-24: 25 mg via ORAL
  Filled 2017-12-24: qty 1

## 2017-12-24 MED ORDER — TRAMADOL HCL 50 MG PO TABS
50.0000 mg | ORAL_TABLET | Freq: Four times a day (QID) | ORAL | Status: DC | PRN
  Administered 2017-12-24 – 2017-12-25 (×4): 50 mg via ORAL
  Filled 2017-12-24 (×4): qty 1

## 2017-12-24 MED ORDER — ONDANSETRON HCL 4 MG/2ML IJ SOLN
4.0000 mg | Freq: Four times a day (QID) | INTRAMUSCULAR | Status: DC | PRN
  Administered 2017-12-24: 4 mg via INTRAVENOUS
  Filled 2017-12-24: qty 2

## 2017-12-24 NOTE — Progress Notes (Addendum)
[facility] Liaison: RN visit  Notified by Dr. Shan Levans of patient/family request for [facility] services at home after discharge.  Visited with daughter Crystal at bedside, patient was off the floor for procedure.  Crystal stated she was unaware of the referral and wished to discuss it with her mother and sister. Writer provided education related to hospice philosophy, services and team approach to care and left contact information.   [facility] Liasion will follow up with [facility] and patient and family tomorrow to confirm interest.  Please call with any hospice related questions.   Thank you,  Farrel Gordon, RN, [facility] Liaison [phone]  [facility] Liaisons are on AMION

## 2017-12-24 NOTE — Consult Note (Signed)
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  She is readmitted for a worsening of her decompensated cirrhosis.  Her last D/C creatinine was at 1.7, but now her creatinine increased up to 2.5.  Her TB has markedly increased to 16 and her INR has worsened from 2.1 to 2.9.  During the time after her discharge she suffered with right leg pain and left arm pain.  Her abdominal pain has resolved and she reported feeling well.  With her extremity pain and the worsening of her edema she was readmitted.  Past Medical History:  Diagnosis Date  . Anxiety   . Arthritis    "qwhere" (12/06/2017)  . Bilateral swelling of feet   . Bone spur of foot   . Breast cancer, left breast ([place]) 2011  . Chronic bronchitis ([place])   . Chronic lower back pain   . Depression   . GERD (gastroesophageal reflux disease)   . Hepatitis C   . Hypertension   . Insomnia   . Leg cramps   . Personal history of radiation therapy 2011   Left Breast Cancer  . Seasonal allergies    "take RX all year round" (12/06/2017)  . Type 2 diabetes, diet controlled ([place])     Past Surgical History:  Procedure Laterality Date  . BREAST BIOPSY Left 2011  . COLONOSCOPY    . DILATION AND CURETTAGE OF UTERUS    . epidural steroid injection    . ESOPHAGOGASTRODUODENOSCOPY (EGD) WITH PROPOFOL N/A 10/11/2017   Procedure: ESOPHAGOGASTRODUODENOSCOPY (EGD) WITH PROPOFOL;  Surgeon: Carol Ada, MD;  Location: [facility];  Service: Endoscopy;  Laterality: N/A;  . HYSTEROSCOPY W/D&C N/A 05/15/2016   Procedure: DILATATION AND CURETTAGE /HYSTEROSCOPY;  Surgeon: Lavonia Drafts, MD;  Location: [facility];  Service: Gynecology;  Laterality: N/A;  . IR PARACENTESIS  10/08/2017  . MASTECTOMY PARTIAL / LUMPECTOMY Left 08/2010   Archie Endo 08/24/2010  . MOUTH SURGERY Bilateral 2010?   "cheeks were filling up w/water;  dr. biopsied it; fluid drained outt"  . TONSILLECTOMY  ~ 1979  . TRIGGER FINGER RELEASE Bilateral 2004   "thumbs"    Family History  Problem Relation Age of Onset  . Diabetes Mother   . Diabetes Father   . Hypertension Other     Social History:  reports that she quit smoking about 8 years ago. Her smoking use included cigarettes. She has a 20.00 pack-year smoking history. She has never used smokeless tobacco. She reports that she drank alcohol. She reports that she has current or past drug history. Drugs: Cocaine and Marijuana.  Allergies:  Allergies  Allergen Reactions  . Aspirin Other (See Comments)    Stomach aches   . Fexofenadine Other (See Comments)    Causes leg and back pain  . Hydrocodone Itching    Medications:  Scheduled: . darifenacin  7.5 mg Oral Daily  . ferrous sulfate  325 mg Oral Q breakfast  . [START ON 12/25/2017] furosemide  80 mg Oral Daily  . lactulose  20 g Oral TID  . rifaximin  550 mg Oral BID  . silver sulfADIAZINE   Topical BID   Continuous:   Results for orders placed or performed during the hospital encounter of 12/23/17 (from the past 24 hour(s))  CBC with Differential     Status: Abnormal   Collection Time: 12/23/17  8:50 PM  Result Value  Ref Range   WBC 15.2 (H) 4.0 - 10.5 K/uL   RBC 2.46 (L) 3.87 - 5.11 MIL/uL   Hemoglobin 8.1 (L) 12.0 - 15.0 g/dL   HCT 22.6 (L) 36.0 - 46.0 %   MCV 91.9 78.0 - 100.0 fL   MCH 32.9 26.0 - 34.0 pg   MCHC 35.8 30.0 - 36.0 g/dL   RDW 19.2 (H) 11.5 - 15.5 %   Platelets 94 (L) 150 - 400 K/uL   Neutrophils Relative % 85 %   Lymphocytes Relative 7 %   Monocytes Relative 8 %   Eosinophils Relative 0 %   Basophils Relative 0 %   Neutro Abs 12.9 (H) 1.7 - 7.7 K/uL   Lymphs Abs 1.1 0.7 - 4.0 K/uL   Monocytes Absolute 1.2 (H) 0.1 - 1.0 K/uL   Eosinophils Absolute 0.0 0.0 - 0.7 K/uL   Basophils Absolute 0.0 0.0 - 0.1 K/uL  Comprehensive metabolic panel     Status: Abnormal   Collection Time: 12/23/17  9:20  PM  Result Value Ref Range   Sodium 128 (L) 135 - 145 mmol/L   Potassium 5.4 (H) 3.5 - 5.1 mmol/L   Chloride 99 (L) 101 - 111 mmol/L   CO2 18 (L) 22 - 32 mmol/L   Glucose, Bld 97 65 - 99 mg/dL   BUN 73 (H) 6 - 20 mg/dL   Creatinine, Ser 2.50 (H) 0.44 - 1.00 mg/dL   Calcium 8.7 (L) 8.9 - 10.3 mg/dL   Total Protein 6.7 6.5 - 8.1 g/dL   Albumin 3.1 (L) 3.5 - 5.0 g/dL   AST 87 (H) 15 - 41 U/L   ALT 65 (H) 14 - 54 U/L   Alkaline Phosphatase 70 38 - 126 U/L   Total Bilirubin 15.6 (H) 0.3 - 1.2 mg/dL   GFR calc non Af Amer 20 (L) >60 mL/min   GFR calc Af Amer 23 (L) >60 mL/min   Anion gap 11 5 - 15  Brain natriuretic peptide     Status: Abnormal   Collection Time: 12/23/17  9:20 PM  Result Value Ref Range   B Natriuretic Peptide 118.5 (H) 0.0 - 100.0 pg/mL  I-stat troponin, ED     Status: None   Collection Time: 12/23/17  9:23 PM  Result Value Ref Range   Troponin i, poc 0.06 0.00 - 0.08 ng/mL   Comment 3          I-Stat CG4 Lactic Acid, ED     Status: Abnormal   Collection Time: 12/23/17  9:25 PM  Result Value Ref Range   Lactic Acid, Venous 2.13 (HH) 0.5 - 1.9 mmol/L   Comment NOTIFIED PHYSICIAN   Protime-INR     Status: Abnormal   Collection Time: 12/23/17 11:11 PM  Result Value Ref Range   Prothrombin Time 30.8 (H) 11.4 - 15.2 seconds   INR 2.99   Comprehensive metabolic panel     Status: Abnormal   Collection Time: 12/24/17  4:16 AM  Result Value Ref Range   Sodium 131 (L) 135 - 145 mmol/L   Potassium 5.3 (H) 3.5 - 5.1 mmol/L   Chloride 100 (L) 101 - 111 mmol/L   CO2 20 (L) 22 - 32 mmol/L   Glucose, Bld 75 65 - 99 mg/dL   BUN 73 (H) 6 - 20 mg/dL   Creatinine, Ser 2.30 (H) 0.44 - 1.00 mg/dL   Calcium 8.6 (L) 8.9 - 10.3 mg/dL   Total Protein 5.7 (L) 6.5 - 8.1  g/dL   Albumin 2.7 (L) 3.5 - 5.0 g/dL   AST 77 (H) 15 - 41 U/L   ALT 55 (H) 14 - 54 U/L   Alkaline Phosphatase 72 38 - 126 U/L   Total Bilirubin 14.0 (H) 0.3 - 1.2 mg/dL   GFR calc non Af Amer 22 (L) >60 mL/min    GFR calc Af Amer 25 (L) >60 mL/min   Anion gap 11 5 - 15  CBC     Status: Abnormal   Collection Time: 12/24/17  4:16 AM  Result Value Ref Range   WBC 13.0 (H) 4.0 - 10.5 K/uL   RBC 2.22 (L) 3.87 - 5.11 MIL/uL   Hemoglobin 7.5 (L) 12.0 - 15.0 g/dL   HCT 20.4 (L) 36.0 - 46.0 %   MCV 91.9 78.0 - 100.0 fL   MCH 33.8 26.0 - 34.0 pg   MCHC 36.8 (H) 30.0 - 36.0 g/dL   RDW 19.3 (H) 11.5 - 15.5 %   Platelets 75 (L) 150 - 400 K/uL  Glucose, capillary     Status: None   Collection Time: 12/24/17  8:13 AM  Result Value Ref Range   Glucose-Capillary 92 65 - 99 mg/dL     Dg Chest 2 View  Result Date: 12/23/2017 CLINICAL DATA:  Dyspnea on exertion EXAM: CHEST - 2 VIEW COMPARISON:  10/07/2017 FINDINGS: No significant pleural effusion. Mild cardiomegaly with vascular congestion and mild diffuse interstitial opacity consistent with edema. Aortic atherosclerosis. No pneumothorax. IMPRESSION: Cardiomegaly with mild central congestion and minimal edema. Electronically Signed   By: Donavan Foil M.D.   On: 12/23/2017 20:50   Dg Wrist Complete Left  Result Date: 12/24/2017 CLINICAL DATA:  Left wrist pain and swelling.  No injury. EXAM: LEFT WRIST - COMPLETE 3+ VIEW COMPARISON:  Left wrist x-rays dated November 27, 2012. FINDINGS: No acute fracture or dislocation. Mild carpal and radiocarpal degenerative changes. Mild osteopenia. Mild diffuse soft tissue swelling about the distal forearm and wrist. No chondrocalcinosis. IMPRESSION: 1. Mild degenerative changes.  No acute osseous abnormality. Electronically Signed   By: Titus Dubin M.D.   On: 12/24/2017 11:24    ROS:  As stated above in the HPI otherwise negative.  Blood pressure (!) 126/55, pulse 81, temperature 98.3 F (36.8 C), temperature source Oral, resp. rate 18, height 5\' 7"  (1.702 m), weight 118.8 kg (261 lb 14.5 oz), last menstrual period 06/04/2017, SpO2 100 %.    PE: Gen: NAD, Alert and Oriented, ? Mild confusion HEENT:  [place]/AT, EOMI Neck:  Supple, no LAD Lungs: CTA Bilaterally CV: RRR without M/G/R ABM: Soft, distended, + ascites, +BS Ext: + edema  Assessment/Plan: 1) End stage liver disease. 2) HCV cirrhosis. 3) Renal failure - HRS II versus prerenal or a combination of both.   Her current MELD score is at 39 on admission.  Ever since her noncompliance with her diet December 2018 she has not been able to recover.  Slowly she has continued to decline.  Her hepatic failure is clearly documented with her hyperbilirubinemia and worsening INR.  When she was compensated there were plans to pursue treatment of her HCV, but this is far too dangerous to treat her HCV in a decompensated state.  She is also not a transplant candidate with her poor mobility and markedly increased MELD score.  I had a difficult conversation with Murray Hodgkins about her health and that she is end stage.  There are no other viable options for her, which was hard for her  to accept.  I offered to call her daughter to explain the situation, but she declined.  She prefers to go home to spend the rest of her days.  Plan: 1) Hospice care. 2) No further GI recommendations.  Signing off.  Marvene Strohm D 12/24/2017, 3:34 PM
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
## 2017-12-24 NOTE — ED Notes (Signed)
Attempted to call report
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
## 2017-12-25 ENCOUNTER — Encounter (HOSPITAL_COMMUNITY): Payer: Self-pay

## 2017-12-25 ENCOUNTER — Encounter (HOSPITAL_COMMUNITY): Payer: Self-pay | Admitting: General Practice

## 2017-12-25 DIAGNOSIS — B182 Chronic viral hepatitis C: Secondary | ICD-10-CM

## 2017-12-25 DIAGNOSIS — D631 Anemia in chronic kidney disease: Secondary | ICD-10-CM

## 2017-12-25 DIAGNOSIS — R011 Cardiac murmur, unspecified: Secondary | ICD-10-CM

## 2017-12-25 DIAGNOSIS — Z515 Encounter for palliative care: Secondary | ICD-10-CM

## 2017-12-25 DIAGNOSIS — T2125XA Burn of second degree of buttock, initial encounter: Secondary | ICD-10-CM

## 2017-12-25 DIAGNOSIS — M25432 Effusion, left wrist: Secondary | ICD-10-CM

## 2017-12-25 DIAGNOSIS — X16XXXA Contact with hot heating appliances, radiators and pipes, initial encounter: Secondary | ICD-10-CM

## 2017-12-25 LAB — CBC
HEMATOCRIT: 20.1 % — AB (ref 36.0–46.0)
HEMOGLOBIN: 7.2 g/dL — AB (ref 12.0–15.0)
MCH: 33 pg (ref 26.0–34.0)
MCHC: 35.8 g/dL (ref 30.0–36.0)
MCV: 92.2 fL (ref 78.0–100.0)
Platelets: 87 10*3/uL — ABNORMAL LOW (ref 150–400)
RBC: 2.18 MIL/uL — ABNORMAL LOW (ref 3.87–5.11)
RDW: 19.4 % — AB (ref 11.5–15.5)
WBC: 7.7 10*3/uL (ref 4.0–10.5)

## 2017-12-25 LAB — BASIC METABOLIC PANEL
ANION GAP: 11 (ref 5–15)
BUN: 63 mg/dL — AB (ref 6–20)
CALCIUM: 8.3 mg/dL — AB (ref 8.9–10.3)
CO2: 21 mmol/L — ABNORMAL LOW (ref 22–32)
Chloride: 100 mmol/L — ABNORMAL LOW (ref 101–111)
Creatinine, Ser: 1.97 mg/dL — ABNORMAL HIGH (ref 0.44–1.00)
GFR calc Af Amer: 30 mL/min — ABNORMAL LOW (ref >60)
GFR, EST NON AFRICAN AMERICAN: 26 mL/min — AB (ref >60)
GLUCOSE: 115 mg/dL — AB (ref 65–99)
POTASSIUM: 4.5 mmol/L (ref 3.5–5.1)
Sodium: 132 mmol/L — ABNORMAL LOW (ref 135–145)

## 2017-12-25 LAB — GLUCOSE, CAPILLARY: Glucose-Capillary: 189 mg/dL — ABNORMAL HIGH (ref 65–99)

## 2017-12-25 MED ORDER — SENNA 8.6 MG PO TABS: 1.0000 | ORAL_TABLET | Freq: Every evening | ORAL | Status: DC | PRN

## 2017-12-25 MED ORDER — GLYCOPYRROLATE 1 MG PO TABS
1.0000 mg | ORAL_TABLET | ORAL | Status: DC | PRN
  Filled 2017-12-25: qty 1

## 2017-12-25 MED ORDER — LORAZEPAM 2 MG/ML IJ SOLN: 1.0000 mg | INTRAMUSCULAR | Status: DC | PRN

## 2017-12-25 MED ORDER — HALOPERIDOL LACTATE 2 MG/ML PO CONC
0.5000 mg | ORAL | Status: DC | PRN
  Filled 2017-12-25: qty 0.3

## 2017-12-25 MED ORDER — DIPHENHYDRAMINE HCL 50 MG/ML IJ SOLN: 12.5000 mg | INTRAMUSCULAR | Status: DC | PRN

## 2017-12-25 MED ORDER — LORAZEPAM 1 MG PO TABS: 1.0000 mg | ORAL_TABLET | ORAL | Status: DC | PRN

## 2017-12-25 MED ORDER — MORPHINE SULFATE (CONCENTRATE) 10 MG/0.5ML PO SOLN: 5.0000 mg | ORAL | Status: DC | PRN

## 2017-12-25 MED ORDER — HALOPERIDOL 1 MG PO TABS: 0.5000 mg | ORAL_TABLET | ORAL | Status: DC | PRN

## 2017-12-25 MED ORDER — ONDANSETRON HCL 4 MG/2ML IJ SOLN: 4.0000 mg | Freq: Four times a day (QID) | INTRAMUSCULAR | Status: DC | PRN

## 2017-12-25 MED ORDER — ACETAMINOPHEN 650 MG RE SUPP: 650.0000 mg | Freq: Four times a day (QID) | RECTAL | Status: DC | PRN

## 2017-12-25 MED ORDER — ACETAMINOPHEN 325 MG PO TABS: 650.0000 mg | ORAL_TABLET | Freq: Four times a day (QID) | ORAL | Status: DC | PRN

## 2017-12-25 MED ORDER — FLEET ENEMA 7-19 GM/118ML RE ENEM: 1.0000 | ENEMA | Freq: Every day | RECTAL | Status: DC | PRN

## 2017-12-25 MED ORDER — MORPHINE SULFATE (CONCENTRATE) 10 MG/0.5ML PO SOLN
5.0000 mg | ORAL | Status: DC | PRN
  Administered 2017-12-27: 5 mg via ORAL
  Filled 2017-12-25: qty 0.5

## 2017-12-25 MED ORDER — GLYCOPYRROLATE 0.2 MG/ML IJ SOLN: 0.2000 mg | INTRAMUSCULAR | Status: DC | PRN

## 2017-12-25 MED ORDER — TRAMADOL HCL 50 MG PO TABS
50.0000 mg | ORAL_TABLET | Freq: Four times a day (QID) | ORAL | Status: DC | PRN
  Administered 2017-12-25 – 2017-12-27 (×5): 50 mg via ORAL
  Filled 2017-12-25 (×5): qty 1

## 2017-12-25 MED ORDER — BISACODYL 10 MG RE SUPP: 10.0000 mg | Freq: Every day | RECTAL | Status: DC | PRN

## 2017-12-25 MED ORDER — POLYVINYL ALCOHOL 1.4 % OP SOLN
1.0000 [drp] | Freq: Four times a day (QID) | OPHTHALMIC | Status: DC | PRN
  Filled 2017-12-25: qty 15

## 2017-12-25 MED ORDER — LORAZEPAM 2 MG/ML PO CONC: 1.0000 mg | ORAL | Status: DC | PRN

## 2017-12-25 MED ORDER — ONDANSETRON 4 MG PO TBDP: 4.0000 mg | ORAL_TABLET | Freq: Four times a day (QID) | ORAL | Status: DC | PRN

## 2017-12-25 MED ORDER — HALOPERIDOL LACTATE 5 MG/ML IJ SOLN: 0.5000 mg | INTRAMUSCULAR | Status: DC | PRN

## 2017-12-25 NOTE — Consult Note (Signed)
Consultation Note Date: 12/25/2017   Patient Name: Elizabeth Mclean  DOB: 1956-09-02  MRN: [id number]  Age / Sex: 62 y.o., female  PCP: Kerin Perna, NP Referring Physician: Aldine Contes, MD  Reason for Consultation: Establishing goals of care, Hospice Evaluation and Psychosocial/spiritual support  HPI/Patient Profile: 62 y.o. female  with past medical history of HCV cirrhosis, HTN, DM, GERD, chronic pain, and depression admitted on 12/23/2017 with extremity edema and confusion. Pt found to have decompensated cirrhosis secondary to HCV and AKI. MELD score on admission 39. Per GI, there are no viable treatment options and hospice was recommended.  PMT consulted for [place].  Clinical Assessment and Goals of Care: I have reviewed medical records including EPIC notes, labs and imaging, received report from Dr. Shan Levans, assessed the patient and then met at the bedside along with the patient  to discuss diagnosis prognosis, [place], EOL wishes, disposition and options.  I introduced Palliative Medicine as specialized medical care for people living with serious illness. It focuses on providing relief from the symptoms and stress of a serious illness. The goal is to improve quality of life for both the patient and the family.  We discussed a brief life review of the patient. She has 3 children, [place], [place], and [place]. She tells me her and Glendell Docker are estranged but have recently started speaking again. She also tells me her faith is of great importance to her.   We discussed her liver failure and what it means in the larger context of her on-going co-morbidities.  Natural disease trajectory and expectations at EOL were discussed. She understands she is near EOL and described her conversation with Dr. Benson Norway. She tells me "I'm scared and I'm not scared". We discussed this further.  I attempted to elicit values and goals of care important to the patient.  Quality of life and her faith are important to her. She would like to spend the time she has left at home close to her family.  The difference between aggressive medical intervention and comfort care was considered in light of the patient's goals of care. She understands there are no viable medical interventions left to offer. She would like to focus on comfort, dignity, and quality of life.  We discussed code status and she confirms she would want to be allowed to pass naturally - discussed DNR status.  Hospice and Palliative Care services outpatient were explained and offered. Patient is agreeable to outpatient hospice.   Patient's biggest concern is her children. She is concerned about her daughters dealing with her prognosis and decision for Hospice. I offered a family meeting. Her 2 daughters will come this afternoon for meeting.  Addendum:   Dr. Shan Levans and I met with patient, 2 daughters, and son-in-law at bedside to discuss patient prognosis with them. All questions and concerns addressed.   Discussed shifting focus from aggressive medical interventions to quality of life and comfort. Discussed how Hospice offers this type of care in the home.  Their concern is where patient is going to stay after discharge - they are deciding between daughter's home in [place] or patient's home in [place].  Family and patient very tearful throughout conversation, talked about their faith and not giving up hope. Agreed to home hospice care. They want to avoid future hospitalizations and focus on comfort.   The family was encouraged to call with questions or concerns.   Primary Decision Maker PATIENT    SUMMARY OF RECOMMENDATIONS   - code status changed  to DNR - home with hospice - palliative will continue to follow  Code Status/Advance Care Planning:  DNR  Symptom Management:  Per primary  Palliative Prophylaxis:   Aspiration, Delirium Protocol and Frequent Pain  Assessment  Additional Recommendations (Limitations, Scope, Preferences):  Avoid Hospitalization, no aggressive medical interventions, Hospice care, DNR  Psycho-social/Spiritual:   Desire for further Chaplaincy support:yes  Additional Recommendations: Education on Hospice  Prognosis:   < 6 months d/t end stage liver disease and renal failure, MELD score 39, no viable treatment options  Discharge Planning: Home with Hospice      Primary Diagnoses: Present on Admission: . 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 The following aspects are pertinent.  Past Medical History:  Diagnosis Date  . Anxiety   . Arthritis    "qwhere" (12/06/2017)  . Bilateral swelling of feet   . Bone spur of foot   . Breast cancer, left breast ([place]) 2011  . Chronic bronchitis ([place])   . Chronic lower back pain   . Depression   . GERD (gastroesophageal reflux disease)   . Hepatitis C   . Hypertension   . Insomnia   . Leg cramps   . Personal history of radiation therapy 2011   Left Breast Cancer  . Seasonal allergies    "take RX all year round" (12/06/2017)  . Type 2 diabetes, diet controlled ([place])    Social History   Socioeconomic History  . Marital status: Single    Spouse name: Not on file  . Number of children: Not on file  . Years of education: Not on file  . Highest education level: Not on file  Occupational History  . Not on file  Social Needs  . Financial resource strain: Not on file  . Food insecurity:    Worry: Not on file    Inability: Not on file  . Transportation needs:    Medical: Not on file    Non-medical: Not on file  Tobacco Use  . Smoking status: Former Smoker    Packs/day: 0.50    Years: 40.00    Pack years: 20.00    Types: Cigarettes    Last attempt to quit: 2011    Years since quitting: 8.2  . Smokeless tobacco: Never Used   Substance and Sexual Activity  . Alcohol use: Not Currently    Comment: 12/06/2017 "nothing since 1993 or so"  . Drug use: Not Currently    Types: Cocaine, Marijuana    Comment: 12/06/2017 "nothing since 1993 or so"  . Sexual activity: Not Currently  Lifestyle  . Physical activity:    Days per week: Not on file    Minutes per session: Not on file  . Stress: Not on file  Relationships  . Social connections:    Talks on phone: Not on file    Gets together: Not on file    Attends religious service: Not on file    Active member of club or organization: Not on file    Attends meetings of clubs or organizations: Not on file    Relationship status: Not on file  Other Topics Concern  . Not on file  Social History Narrative  . Not on file   Family History  Problem Relation Age of Onset  . Diabetes Mother   . Diabetes Father   . Hypertension Other    Scheduled Meds: .  darifenacin  7.5 mg Oral Daily  . ferrous sulfate  325 mg Oral Q breakfast  . furosemide  80 mg Oral Daily  . lactulose  20 g Oral TID  . rifaximin  550 mg Oral BID  . silver sulfADIAZINE   Topical BID   Continuous Infusions: PRN Meds:.hydrOXYzine, ondansetron **OR** ondansetron (ZOFRAN) IV, traMADol Allergies  Allergen Reactions  . Aspirin Other (See Comments)    Stomach aches   . Fexofenadine Other (See Comments)    Causes leg and back pain  . Hydrocodone Itching   Review of Systems  Constitutional: Positive for activity change, appetite change and fatigue.  Gastrointestinal: Positive for abdominal distention.  Neurological: Positive for weakness.  Psychiatric/Behavioral: Positive for decreased concentration.    Physical Exam  Constitutional: She is oriented to person, place, and time. She is cooperative. She appears ill.  HENT:  Head: Normocephalic and atraumatic.  Cardiovascular: Normal rate and regular rhythm.  Pulmonary/Chest: No accessory muscle usage. No respiratory distress.  Abdominal: She  exhibits distension.  Neurological: She is oriented to person, place, and time.  Drowsy, weak  Skin: Skin is warm and dry.  Psychiatric:  Tearful throughout conversation    Vital Signs: BP (!) 104/56 (BP Location: Right Arm)   Pulse 92   Temp 98.8 F (37.1 C) (Oral)   Resp 16   Ht 5' 7"  (1.702 m)   Wt 118.8 kg (261 lb 14.5 oz)   LMP 06/04/2017 (LMP Unknown)   SpO2 98%   BMI 41.02 kg/m  Pain Scale: 0-10   Pain Score: Asleep   SpO2: SpO2: 98 % O2 Device:SpO2: 98 % O2 Flow Rate: .   IO: Intake/output summary:   Intake/Output Summary (Last 24 hours) at 12/25/2017 0842 Last data filed at 12/25/2017 0534 Gross per 24 hour  Intake 240 ml  Output 1200 ml  Net -960 ml    LBM: Last BM Date: (unable to tell when) Baseline Weight: Weight: 124.7 kg (275 lb) Most recent weight: Weight: 118.8 kg (261 lb 14.5 oz)     Palliative Assessment/Data: PPS 40%     Time In: 15:00 Time Out: 16:30 Time Total: 90 minutes Greater than 50%  of this time was spent counseling and coordinating care related to the above assessment and plan.  Juel Burrow, DNP, AGNP-C Palliative Medicine Team [phone]

## 2017-12-25 NOTE — Progress Notes (Signed)
Subjective: Pt upset and tearful today, had further discussion about patient's poor prognosis and how we would help her in any way possible during this difficult time.  I personally apologized for the hospice representative speaking with the patient's daughter before speaking to the patient and asking if that would be okay.  Pt is not having much pain today.      Objective:  Vital signs in last 24 hours: Vitals:   12/24/17 0539 12/24/17 1326 12/24/17 2111 12/25/17 0532  BP: (!) 124/43 (!) 126/55 129/61 (!) 104/56  Pulse: 82 81 83 92  Resp: 18 18 18 16   Temp: 99.1 F (37.3 C) 98.3 F (36.8 C) 98.9 F (37.2 C) 98.8 F (37.1 C)  TempSrc: Oral Oral Oral Oral  SpO2: 98% 100% 98% 98%  Weight:      Height:       Physical Exam  Constitutional: She is oriented to person, place, and time. No distress.  Eyes: Scleral icterus is present.  Neck: JVD (neck veins distended some collapse on inspiration) present.  Cardiovascular: Normal rate and regular rhythm. Exam reveals no gallop and no friction rub.  Murmur heard.  Systolic murmur is present with a grade of 3/6. Pulmonary/Chest: Effort normal. No respiratory distress. She has no wheezes.  Could not assess posteriorly for lung sounds, pt could not sit up or roll over  Abdominal: Soft. Bowel sounds are normal. She exhibits distension. She exhibits no mass. There is no tenderness. There is no rebound and no guarding.  Musculoskeletal: She exhibits edema (3+ pitting edema of bilateral LE).  Neurological: She is alert and oriented to person, place, and time.  Skin: She is not diaphoretic.  Diffuse petechiae with higher concentration on bilateral arms and legs.  Burn on right gluteal region from heating pad   CMP Latest Ref Rng & Units 12/24/2017 12/23/2017 12/16/2017  Glucose 65 - 99 mg/dL 75 97 123(H)  BUN 6 - 20 mg/dL 73(H) 73(H) 55(H)  Creatinine 0.44 - 1.00 mg/dL 2.30(H) 2.50(H) 1.72(H)  Sodium 135 - 145 mmol/L 131(L) 128(L) 130(L)    Potassium 3.5 - 5.1 mmol/L 5.3(H) 5.4(H) 5.2(H)  Chloride 101 - 111 mmol/L 100(L) 99(L) 99(L)  CO2 22 - 32 mmol/L 20(L) 18(L) 22  Calcium 8.9 - 10.3 mg/dL 8.6(L) 8.7(L) 8.5(L)  Total Protein 6.5 - 8.1 g/dL 5.7(L) 6.7 6.1(L)  Total Bilirubin 0.3 - 1.2 mg/dL 14.0(H) 15.6(H) 7.4(H)  Alkaline Phos 38 - 126 U/L 72 70 63  AST 15 - 41 U/L 77(H) 87(H) 129(H)  ALT 14 - 54 U/L 55(H) 65(H) 64(H)   Lab Results  Component Value Date   INR 2.99 12/23/2017   INR 2.12 12/06/2017   INR 2.16 10/10/2017   CBC Latest Ref Rng & Units 12/24/2017 12/23/2017 12/16/2017  WBC 4.0 - 10.5 K/uL 13.0(H) 15.2(H) 16.7(H)  Hemoglobin 12.0 - 15.0 g/dL 7.5(L) 8.1(L) 7.6(L)  Hematocrit 36.0 - 46.0 % 20.4(L) 22.6(L) 20.7(L)  Platelets 150 - 400 K/uL 75(L) 94(L) 51(L)    Assessment/Plan:  Active Problems:   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    -hold spironolactone -lasix PO 80mg  daily -continue lactulose and rifaximin -pt has chosen to go home with hospice, we are working with our case managers,  palliative care and the hospice organization to facilitate this process -will go over which medications will be necessary for comfort care  AKI CKD 3: Cr at 1.7 on discharge admitting Cr 2.5 similar to last hospitilization.  Likely 2/2 worsening hepatorenal syndrome  -currently receiving albumin -continue to monitor  2nd degree burn 2/2 heating pad: does not look infected, not overly painful   -wound care consulted -has tramadol 50mg  q6 hours for pain  Anemia of chronic disease: hgb on admission 8.1  -Transfusion goal <7  L Wrist pain and swelling  -x-ray negative for fracture   Dispo: Anticipated discharge home  later today vs tomorow  Katherine Roan, MD 12/25/2017, 7:05 AM Vickki Muff MD PGY-1 Internal Medicine Pager # [phone]

## 2017-12-25 NOTE — Progress Notes (Addendum)
[facility] Liaison: RN visit  Notified by [facility] of patient/family request for [facility] services at home after discharge. Chart and patient information reviewed by [facility] physician. Hospice eligibility approved by Dr. Konrad Dolores.   Writer spoke with patient at bedside to initiate education related to hospice philosophy, services and team approach to care.           verbalized understanding of information given. Per discussion, plan is for discharge to home by private vehicle, date not yet determined.  Please send signed and completed DNR form home with patient/family. Patient will need prescriptions for discharge comfort medications.  DME needs have been discussed, patient currently has the following equipment in the home: 3N1. Patient/family requests the following DME for delivery to the home: none.   [facility] aware of the above. Please notify [facility] when patient is ready to leave the unit at discharge. (Call [phone] or [phone] after 5pm.) [facility] information and contact numbers given to patient at time of visit. Above information shared with Nira Conn, [facility].  Please call with any hospice related questions.  Thank you for this referral.  Farrel Gordon, RN, [facility] Liaison [phone] ? Hospital liaisons are now on [place].

## 2017-12-26 DIAGNOSIS — B1920 Unspecified viral hepatitis C without hepatic coma: Secondary | ICD-10-CM

## 2017-12-26 DIAGNOSIS — Z7189 Other specified counseling: Secondary | ICD-10-CM

## 2017-12-26 DIAGNOSIS — Z515 Encounter for palliative care: Secondary | ICD-10-CM

## 2017-12-26 DIAGNOSIS — K7469 Other cirrhosis of liver: Principal | ICD-10-CM

## 2017-12-26 MED ORDER — LACTULOSE 10 GM/15ML PO SOLN
20.0000 g | Freq: Three times a day (TID) | ORAL | Status: DC
  Administered 2017-12-26 – 2017-12-27 (×3): 20 g via ORAL
  Filled 2017-12-26 (×3): qty 30

## 2017-12-26 MED ORDER — RIFAXIMIN 550 MG PO TABS
550.0000 mg | ORAL_TABLET | Freq: Two times a day (BID) | ORAL | Status: DC
  Administered 2017-12-26 – 2017-12-27 (×3): 550 mg via ORAL
  Filled 2017-12-26 (×3): qty 1

## 2017-12-26 NOTE — Care Management Note (Signed)
Case Management Note  Patient Details  Name: Elizabeth Mclean MRN: [id number] Date of Birth: [birth year]  Subjective/Objective:                    Action/Plan: Referral for CAPS. Spoke to patient regarding same . CAPS is arranged through PCP. Patient voiced understanding. Patient currently not sure if she will be staying with daughter in [place] or [place]. Explained to patient if she decides to move to [place] we can look at other home hospice agencies . Patient voiced understanding.   Expected Discharge Date:                  Expected Discharge Plan:  Home w Hospice Care  In-House Referral:  Hospice / Palliative Care  Discharge planning Services  CM Consult  Post Acute Care Choice:  Hospice Choice offered to:  Patient  DME Arranged:  N/A DME Agency:  NA  HH Arranged:  NA HH Agency:  [facility]  Status of Service:  In process, will continue to follow  If discussed at Long Length of Stay Meetings, dates discussed:    Additional Comments:  Marilu Favre, RN 12/26/2017, 12:03 PM

## 2017-12-26 NOTE — Progress Notes (Signed)
Subjective:   Pt still with somber mood this morning, having some shoulder pain, made her aware she has pain meds available.     Objective:  Vital signs in last 24 hours: Vitals:   12/25/17 0532 12/25/17 2142 12/26/17 0500 12/26/17 0526  BP: (!) 104/56 (!) 135/53  131/65  Pulse: 92 82  86  Resp: 16 16  16   Temp: 98.8 F (37.1 C) 98.2 F (36.8 C)  98.2 F (36.8 C)  TempSrc: Oral Oral  Oral  SpO2: 98% 100%  100%  Weight:   259 lb (117.5 kg)   Height:       Physical Exam  Constitutional: She is oriented to person, place, and time. No distress.  Eyes: Scleral icterus is present.  Neck: JVD (neck veins distended some collapse on inspiration) present.  Cardiovascular: Normal rate and regular rhythm. Exam reveals no gallop and no friction rub.  Murmur heard.  Systolic murmur is present with a grade of 3/6. Pulmonary/Chest: Effort normal. No respiratory distress. She has no wheezes.  Could not assess posteriorly for lung sounds, pt could not sit up or roll over  Abdominal: Soft. Bowel sounds are normal. She exhibits distension. She exhibits no mass. There is no tenderness. There is no rebound and no guarding.  Musculoskeletal: She exhibits edema (3+ pitting edema of bilateral LE).  Neurological: She is alert and oriented to person, place, and time.  Skin: She is not diaphoretic.  Diffuse petechiae with higher concentration on bilateral arms and legs.  Burn on right gluteal region from heating pad   CMP Latest Ref Rng & Units 12/25/2017 12/24/2017 12/23/2017  Glucose 65 - 99 mg/dL 115(H) 75 97  BUN 6 - 20 mg/dL 63(H) 73(H) 73(H)  Creatinine 0.44 - 1.00 mg/dL 1.97(H) 2.30(H) 2.50(H)  Sodium 135 - 145 mmol/L 132(L) 131(L) 128(L)  Potassium 3.5 - 5.1 mmol/L 4.5 5.3(H) 5.4(H)  Chloride 101 - 111 mmol/L 100(L) 100(L) 99(L)  CO2 22 - 32 mmol/L 21(L) 20(L) 18(L)  Calcium 8.9 - 10.3 mg/dL 8.3(L) 8.6(L) 8.7(L)  Total Protein 6.5 - 8.1 g/dL - 5.7(L) 6.7  Total Bilirubin 0.3 - 1.2 mg/dL -  14.0(H) 15.6(H)  Alkaline Phos 38 - 126 U/L - 72 70  AST 15 - 41 U/L - 77(H) 87(H)  ALT 14 - 54 U/L - 55(H) 65(H)   Lab Results  Component Value Date   INR 2.99 12/23/2017   INR 2.12 12/06/2017   INR 2.16 10/10/2017   CBC Latest Ref Rng & Units 12/25/2017 12/24/2017 12/23/2017  WBC 4.0 - 10.5 K/uL 7.7 13.0(H) 15.2(H)  Hemoglobin 12.0 - 15.0 g/dL 7.2(L) 7.5(L) 8.1(L)  Hematocrit 36.0 - 46.0 % 20.1(L) 20.4(L) 22.6(L)  Platelets 150 - 400 K/uL 87(L) 75(L) 94(L)    Assessment/Plan:  Active Problems:   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    -had family meeting yesterday with palliative care provider present, answered medical questions, explained current medical scenario, offered support to family and to call if any further needs or questions -continue lasix PO 80mg  daily -comfort medications ordered, minimized monitoring and interruptions  AKI CKD 3: Cr at 1.7 on discharge admitting Cr 2.5 similar to last hospitilization.  Likely 2/2  worsening hepatorenal syndrome  -no more lab draws, pt comfort care -diurese for comfort pt can add extra lasix PRN  2nd degree burn 2/2 heating pad: does not look infected, not overly painful   -wound care consulted -has tramadol 50mg  q6 hours for pain  Anemia of chronic disease: hgb on admission 8.1  -no further labs  L Wrist pain and swelling  -x-ray negative for fracture   Dispo: Anticipated discharge home with hospice later today  Katherine Roan, MD 12/26/2017, 6:58 AM Vickki Muff MD PGY-1 Internal Medicine Pager # [phone]

## 2017-12-26 NOTE — Care Management (Addendum)
  [place] with daughter Revonda Standard is on her way to hospital to visit their mother now. Crystal is aware discharge is in am . Patient will discuss discharge with her daughters this evening.  1416 Update: went back to talk to patient, her friend had left. Patient unable to get a hold of daughter. Patient gave NCM Chelsie [phone] number, NCM called same and left voicemail . NCM updated Dr Shan Levans.      Followed up with patient regarding discharge plan. Patient at first still unsure if she will discharge to [place] or [place]. Patient aware discharge is potentially today . Patient's friend at bedside suggested to patient that she discharge to her (patient's)  home today with Hospice and [place]  And have her daughter Chelsie stay with her at night. Then in the future if she wants to move to [place] she could change hospice agencies . Patient in agreement with this plan. NCM asked if we could call daughter Sharyn Lull to confirm. Patient and friend will call Sharyn Lull . Patient asked NCM to return to room in 10 to 15 minutes for update.   Tzvi Economou RNBSN [phone] [id number]

## 2017-12-26 NOTE — Progress Notes (Signed)
Daily Progress Note   Patient Name: Elizabeth Mclean       Date: 12/26/2017 DOB: 1956-07-08  Age: 62 y.o. MRN#: [id number] Attending Physician: Aldine Contes, MD Primary Care Physician: Kerin Perna, NP Admit Date: 12/23/2017  Reason for Consultation/Follow-up: Establishing goals of care, Hospice Evaluation and Psychosocial/spiritual support  Subjective: Tearful yet thankful for follow up visit  Length of Stay: 2  Current Medications: Scheduled Meds:  . darifenacin  7.5 mg Oral Daily  . ferrous sulfate  325 mg Oral Q breakfast  . furosemide  80 mg Oral Daily  . silver sulfADIAZINE   Topical BID    Continuous Infusions:   PRN Meds: acetaminophen **OR** acetaminophen, bisacodyl, diphenhydrAMINE, glycopyrrolate **OR** glycopyrrolate **OR** glycopyrrolate, haloperidol **OR** haloperidol **OR** haloperidol lactate, hydrOXYzine, LORazepam **OR** LORazepam **OR** LORazepam, morphine CONCENTRATE **OR** morphine CONCENTRATE, ondansetron **OR** ondansetron (ZOFRAN) IV, polyvinyl alcohol, senna, sodium phosphate, traMADol  Physical Exam  Constitutional: She is oriented to person, place, and time. No distress.  HENT:  Head: Normocephalic and atraumatic.  Cardiovascular: Normal rate and regular rhythm.  Pulmonary/Chest: Effort normal and breath sounds normal. No respiratory distress.  Abdominal: She exhibits distension.  Musculoskeletal:       Right forearm: She exhibits edema.       Left forearm: She exhibits edema.       Right lower leg: She exhibits edema.       Left lower leg: She exhibits edema.  Neurological: She is alert and oriented to person, place, and time.  Skin: Skin is warm and dry. She is not diaphoretic.  Psychiatric:  tearful            Vital Signs: BP 131/65 (BP  Location: Right Arm)   Pulse 86   Temp 98.2 F (36.8 C) (Oral)   Resp 16   Ht 5\' 7"  (1.702 m)   Wt 117.5 kg (259 lb)   LMP 06/04/2017 (LMP Unknown)   SpO2 100%   BMI 40.57 kg/m  SpO2: SpO2: 100 % O2 Device: O2 Device: Room Air O2 Flow Rate:    Intake/output summary:   Intake/Output Summary (Last 24 hours) at 12/26/2017 1017 Last data filed at 12/26/2017 0537 Gross per 24 hour  Intake 360 ml  Output 1200 ml  Net -840 ml   LBM: Last BM Date: 12/25/17 Baseline Weight: Weight: 124.7 kg (275 lb) Most recent weight: Weight: 117.5 kg (259 lb)       Palliative Assessment/Data: PPS 40%    Flowsheet Rows     Most Recent Value  Intake Tab  Referral Department  Hospitalist  Unit at Time of Referral  Med/Surg Unit  Palliative Care Primary Diagnosis  Other (Comment)  Date Notified  12/25/17  Palliative Care Type  New Palliative care  Reason for referral  Clarify Goals of Care  Date of Admission  12/23/17  Date first seen by Palliative Care  12/25/17  # of days Palliative referral response time  0 Day(s)  # of days IP prior to Palliative referral  2  Clinical Assessment  Palliative Performance Scale Score  40%  Psychosocial & Spiritual Assessment  Palliative Care Outcomes  Patient/Family meeting held?  Yes  Who was at the meeting?  2 daughters, son-in-law  Palliative Care Outcomes  Clarified goals of care, Changed CPR status, Transitioned to hospice, Counseled regarding hospice, Completed durable DNR  Patient/Family wishes: Interventions discontinued/not started   Mechanical Ventilation      Patient Active Problem List   Diagnosis Date Noted  . Palliative care by specialist   . 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 CKD (chronic kidney disease) stage 3, GFR 30-59 ml/min (HCC) 12/10/2017  . Acute on chronic anemia   . Hyperkalemia 12/07/2017  . AKI (acute kidney injury) ([place]) 12/07/2017  . Ascites   . Upper abdominal pain  12/06/2017  . Anasarca 10/08/2017  . Occult blood positive stool 10/08/2017  . RUQ abdominal pain 10/08/2017  . Thrombocytopenia ([place]) 10/08/2017  . Liver cirrhosis ([place]) 06/17/2017  . Menopausal bleeding 05/15/2016  . 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 HYPERTENSION, BENIGN ESSENTIAL, CONTROLLED 05/21/2007  . COCAINE ABUSE 05/15/2007  . ALLERGIC RHINITIS 05/15/2007  . POSTMENOPAUSAL STATUS 05/15/2007  . 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 female  with past medical history of HCV cirrhosis, HTN, DM, GERD, chronic pain, and depression admitted on 12/23/2017 with extremity edema and confusion. Pt found to have decompensated cirrhosis secondary to HCV and AKI. MELD score on admission 39. Per GI, there are no viable treatment options and hospice was recommended.  PMT consulted for [place].  Assessment: Follow-up meeting with patient. She is comfortable w/plan to go home w/hospice. Tearful and tells me about broken relationships within her family. She intends to spends the time she has left trying to mend those relationships. She tells me that she is going back to her home in [place] with support of her family.  Called daughters [name] and Teacher, music. Confirmed plan. They asked about details for prognosis and end of life expectations. We discussed this in detail. Crystal asking about additional community resources available - will consult case management.  Emotional support provided. All questions and concerns addressed. Encouraged to call with any further questions.   Recommendations/Plan: - code status changed to DNR - home with hospice - palliative will continue to follow - case management consult placed per daughter, Crystal request  Goals of Care and Additional Recommendations:  Limitations on Scope of Treatment: Avoid Hospitalization,  no aggressive medical interventions, hospice care, DNR  Code Status:  DNR  Prognosis:   < 6 months d/t end stage liver disease and renal failure, MELD score 39, no viable treatment options  Discharge Planning:  Home with Hospice  Care plan was discussed with patient and daughters  Thank you for allowing the Palliative Medicine Team to assist in the care of this patient.   Time In: 0930 Time Out: 1015 Total Time 45 minutes Prolonged Time Billed  no       Greater than 50%  of this time was spent counseling and coordinating care related to the above assessment and plan.  Juel Burrow, DNP, AGNP-C Palliative Medicine Team Team Phone # [phone]

## 2017-12-27 DIAGNOSIS — K767 Hepatorenal syndrome: Secondary | ICD-10-CM

## 2017-12-27 DIAGNOSIS — K713 Toxic liver disease with chronic persistent hepatitis: Secondary | ICD-10-CM

## 2017-12-27 DIAGNOSIS — T300 Burn of unspecified body region, unspecified degree: Secondary | ICD-10-CM

## 2017-12-27 MED ORDER — MORPHINE SULFATE (CONCENTRATE) 10 MG/0.5ML PO SOLN: 5.0000 mg | ORAL | 0 refills | Status: AC | PRN

## 2017-12-27 MED ORDER — GLYCOPYRROLATE 1 MG PO TABS: 1.0000 mg | ORAL_TABLET | ORAL | 0 refills | Status: AC | PRN

## 2017-12-27 MED ORDER — FUROSEMIDE 80 MG PO TABS: 40.0000 mg | ORAL_TABLET | Freq: Every day | ORAL | 0 refills | Status: AC

## 2017-12-27 MED ORDER — SENNA 8.6 MG PO TABS: 1.0000 | ORAL_TABLET | Freq: Every evening | ORAL | 0 refills | Status: AC | PRN

## 2017-12-27 MED ORDER — POLYVINYL ALCOHOL 1.4 % OP SOLN: 1.0000 [drp] | Freq: Four times a day (QID) | OPHTHALMIC | 0 refills | Status: AC | PRN

## 2017-12-27 MED ORDER — TRAMADOL HCL 50 MG PO TABS: 50.0000 mg | ORAL_TABLET | Freq: Four times a day (QID) | ORAL | 0 refills | Status: AC | PRN

## 2017-12-27 MED ORDER — HALOPERIDOL 0.5 MG PO TABS: 0.5000 mg | ORAL_TABLET | ORAL | 0 refills | Status: AC | PRN

## 2017-12-27 MED ORDER — ACETAMINOPHEN 650 MG RE SUPP: 650.0000 mg | Freq: Four times a day (QID) | RECTAL | 0 refills | Status: AC | PRN

## 2017-12-27 MED ORDER — BISACODYL 10 MG RE SUPP: 10.0000 mg | Freq: Every day | RECTAL | 0 refills | Status: AC | PRN

## 2017-12-27 MED ORDER — LORAZEPAM 2 MG/ML PO CONC: 1.0000 mg | ORAL | 0 refills | Status: AC | PRN

## 2017-12-27 MED ORDER — ONDANSETRON 4 MG PO TBDP: 4.0000 mg | ORAL_TABLET | Freq: Four times a day (QID) | ORAL | 0 refills | Status: AC | PRN

## 2017-12-27 MED ORDER — HALOPERIDOL LACTATE 2 MG/ML PO CONC: 1.0000 mg | ORAL | 0 refills | Status: AC | PRN

## 2017-12-27 MED ORDER — SILVER SULFADIAZINE 1 % EX CREA: TOPICAL_CREAM | Freq: Two times a day (BID) | CUTANEOUS | 0 refills | Status: AC

## 2017-12-27 MED ORDER — HYDROXYZINE HCL 25 MG PO TABS: 25.0000 mg | ORAL_TABLET | Freq: Three times a day (TID) | ORAL | 0 refills | Status: AC | PRN

## 2017-12-27 MED ORDER — ACETAMINOPHEN 325 MG PO TABS: 650.0000 mg | ORAL_TABLET | Freq: Four times a day (QID) | ORAL | 0 refills | Status: AC | PRN

## 2017-12-27 NOTE — Progress Notes (Signed)
Subjective:   Pt still with somber mood this morning, still having some pain, just received morphine, let patient know if that did not resolve her pain we could increase the dose or try another med  Objective:  Vital signs in last 24 hours: Vitals:   12/26/17 1417 12/26/17 2231 12/27/17 0555 12/27/17 1329  BP: (!) 135/58 (!) 120/59 125/61 127/67  Pulse: 84 88 92 83  Resp: 14 16 18    Temp: 98.4 F (36.9 C) 98.8 F (37.1 C) 98.5 F (36.9 C) 98.6 F (37 C)  TempSrc: Oral Oral Oral Oral  SpO2: 100% 100% 100% 98%  Weight:      Height:       Physical Exam  Constitutional: She is oriented to person, place, and time. No distress.  Eyes: Scleral icterus is present.  Neck: JVD (neck veins distended some collapse on inspiration) present.  Cardiovascular: Normal rate and regular rhythm. Exam reveals no gallop and no friction rub.  Murmur heard.  Systolic murmur is present with a grade of 3/6. Pulmonary/Chest: Effort normal. No respiratory distress. She has no wheezes.  Could not assess posteriorly for lung sounds, pt could not sit up or roll over  Abdominal: Soft. Bowel sounds are normal. She exhibits distension. She exhibits no mass. There is no tenderness. There is no rebound and no guarding.  Musculoskeletal: She exhibits edema (3+ pitting edema of bilateral LE).  Neurological: She is alert and oriented to person, place, and time.  Skin: She is not diaphoretic.  Diffuse petechiae with higher concentration on bilateral arms and legs.  Burn on right gluteal region from heating pad   CMP Latest Ref Rng & Units 12/25/2017 12/24/2017 12/23/2017  Glucose 65 - 99 mg/dL 115(H) 75 97  BUN 6 - 20 mg/dL 63(H) 73(H) 73(H)  Creatinine 0.44 - 1.00 mg/dL 1.97(H) 2.30(H) 2.50(H)  Sodium 135 - 145 mmol/L 132(L) 131(L) 128(L)  Potassium 3.5 - 5.1 mmol/L 4.5 5.3(H) 5.4(H)  Chloride 101 - 111 mmol/L 100(L) 100(L) 99(L)  CO2 22 - 32 mmol/L 21(L) 20(L) 18(L)  Calcium 8.9 - 10.3 mg/dL 8.3(L) 8.6(L) 8.7(L)    Total Protein 6.5 - 8.1 g/dL - 5.7(L) 6.7  Total Bilirubin 0.3 - 1.2 mg/dL - 14.0(H) 15.6(H)  Alkaline Phos 38 - 126 U/L - 72 70  AST 15 - 41 U/L - 77(H) 87(H)  ALT 14 - 54 U/L - 55(H) 65(H)   Lab Results  Component Value Date   INR 2.99 12/23/2017   INR 2.12 12/06/2017   INR 2.16 10/10/2017   CBC Latest Ref Rng & Units 12/25/2017 12/24/2017 12/23/2017  WBC 4.0 - 10.5 K/uL 7.7 13.0(H) 15.2(H)  Hemoglobin 12.0 - 15.0 g/dL 7.2(L) 7.5(L) 8.1(L)  Hematocrit 36.0 - 46.0 % 20.1(L) 20.4(L) 22.6(L)  Platelets 150 - 400 K/uL 87(L) 75(L) 94(L)    Assessment/Plan:  Active Problems:   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    -attended family meeting 4/10 with palliative care provider present, answered medical questions, explained current medical scenario, offered support to family and to call if any further needs or questions -continue lasix PO 80mg  daily -comfort medications ordered, minimized monitoring and interruptions -pt  decided on home with hospice with daughter residing in   AKI CKD 3: Cr at 1.7 on discharge admitting Cr 2.5 similar to last hospitilization.  Likely 2/2 worsening hepatorenal syndrome  -no more lab draws, pt comfort care -diurese for comfort pt can add extra lasix PRN  2nd degree burn 2/2 heating pad: does not look infected, not overly painful   -wound care consulted -has tramadol 50mg  q6 hours for pain -morphine available also  Anemia of chronic disease: hgb on admission 8.1  -no further labs  L Wrist pain and swelling  -x-ray negative for fracture   Dispo: Anticipated discharge  home with hospice later today  Katherine Roan, MD 12/27/2017, 4:09 PM Vickki Muff MD PGY-1 Internal Medicine Pager # [phone]

## 2017-12-27 NOTE — Progress Notes (Signed)
Trellis Paganini to be D/C'd  per MD order. Discussed with the patient and daughter all questions fully answered.  VSS, Skin clean, dry and intact without evidence of skin break down, no evidence of skin tears noted.  IV catheter discontinued intact. Site without signs and symptoms of complications. Dressing and pressure applied.  An After Visit Summary was printed and given to the patient. Patient received prescription.  D/c education completed with patient/family including follow up instructions, medication list, d/c activities limitations if indicated, with other d/c instructions as indicated by MD - patient able to verbalize understanding, all questions fully answered.   Patient instructed to return to ED, call 911, or call MD for any changes in condition.   Patient to be escorted home via [facility]

## 2017-12-27 NOTE — Care Management (Signed)
Patient and daughter Donella Stade have decided they want a hospital bed and ambulance transport home. Crystal will be contact person for delivery of hospital bed. Crystal cell [phone] . Explained to [employer] , [facility] will order hospital bed through [place] . [facility] will call Crystal to arrange delivery.  Asked if they want hospital bed delivered before patient discharges to home, Crystal stated no , her mother can be discharged and bed delivered later. Sherry with Hospice and [place] aware and will order hospital bed.   Magdalen Spatz RN BSN [phone]

## 2017-12-27 NOTE — Care Management Note (Signed)
Case Management Note  Patient Details  Name: Elizabeth Mclean MRN: [id number] Date of Birth: Oct 25, 1955  Subjective/Objective:                    Action/Plan:  Discussed discharge planning with patient and Crystal ( daughter ) at bedside. Sheery with Hospice and [place] also spoke to both this am.   Plan is patient will discharge to her home [facility] address) today with Hospice and [place]. They do not want a walker or hospital bed for home at present. If they decide that they do Hospice will order. Discussed transportation home . Patient insisting she does not need ambulance transportation home . Daughter Crystal stated she can drive patient home. If they decide ambulance transportation needed PTAR papers are completed and in shadow chart.   Dr Shan Levans aware of above. Expected Discharge Date:                  Expected Discharge Plan:  Home w Hospice Care  In-House Referral:  Hospice / Palliative Care  Discharge planning Services  CM Consult  Post Acute Care Choice:  Hospice Choice offered to:  Patient, Adult Children  DME Arranged:  N/A DME Agency:  NA  HH Arranged:  NA HH Agency:  [facility]  Status of Service:  Completed, signed off  If discussed at [place] of Stay Meetings, dates discussed:    Additional Comments:  Marilu Favre, RN 12/27/2017, 10:34 AM

## 2018-01-28 ENCOUNTER — Inpatient Hospital Stay: Payer: Medicaid Other | Attending: Adult Health | Admitting: Adult Health

## 2018-02-15 DEATH — deceased

## 2018-12-31 IMAGING — NM NM HEPATOBILIARY IMAGE, INC GB
1 series · 6 of 6 positions shown · non-contrast
Comparison: None

CLINICAL DATA: RIGHT upper quadrant pain question cholecystitis,
history of cirrhosis, hepatitis-C

EXAM:
NUCLEAR MEDICINE HEPATOBILIARY IMAGING
TECHNIQUE: Sequential images of the abdomen were obtained [DATE] minutes
following intravenous administration of radiopharmaceutical.
Following 1 hour of imaging, patient was injected with morphine and
imaging was continued for 30 minutes.
RADIOPHARMACEUTICALS:  4.2 mCi 7c-DDm  Choletec IV

[he hepatobiliary · 3.10mm/px · 6 of 60 frames shown]
[frame 6/60  full-range]
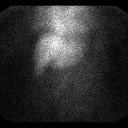
[frame 16/60  full-range]
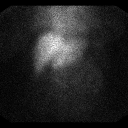
[frame 26/60  full-range]
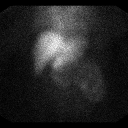
[frame 36/60  full-range]
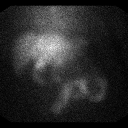
[frame 46/60  full-range]
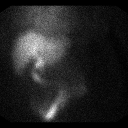
[frame 56/60  full-range]
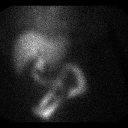

[6 of 6 positions shown; findings below may reference images not displayed]

FINDINGS: Normal tracer extraction from bloodstream indicating normal
hepatocellular function.

Normal excretion of tracer into biliary tree.

Gallbladder did not visualize after 1 hour of imaging.

Following morphine and an additional 30 minutes of imaging, the
gallbladder failed to visualize.

Small bowel was visualized by 20 minutes.

Somewhat prolonged hepatic retention of tracer consistent with
hepatocellular dysfunction.
IMPRESSION: Nonvisualization of the gallbladder despite morphine augmentation
consistent with acute cholecystitis.

Prolonged hepatic retention of tracer consistent with hepatocellular
dysfunction; recommend correlation with LFTs.

## 2019-03-17 IMAGING — DX DG CHEST 2V
2 series · 2 of 2 positions shown · non-contrast
Comparison: 10/07/2017

CLINICAL DATA: Dyspnea on exertion

EXAM:
CHEST - 2 VIEW

[chest ap]
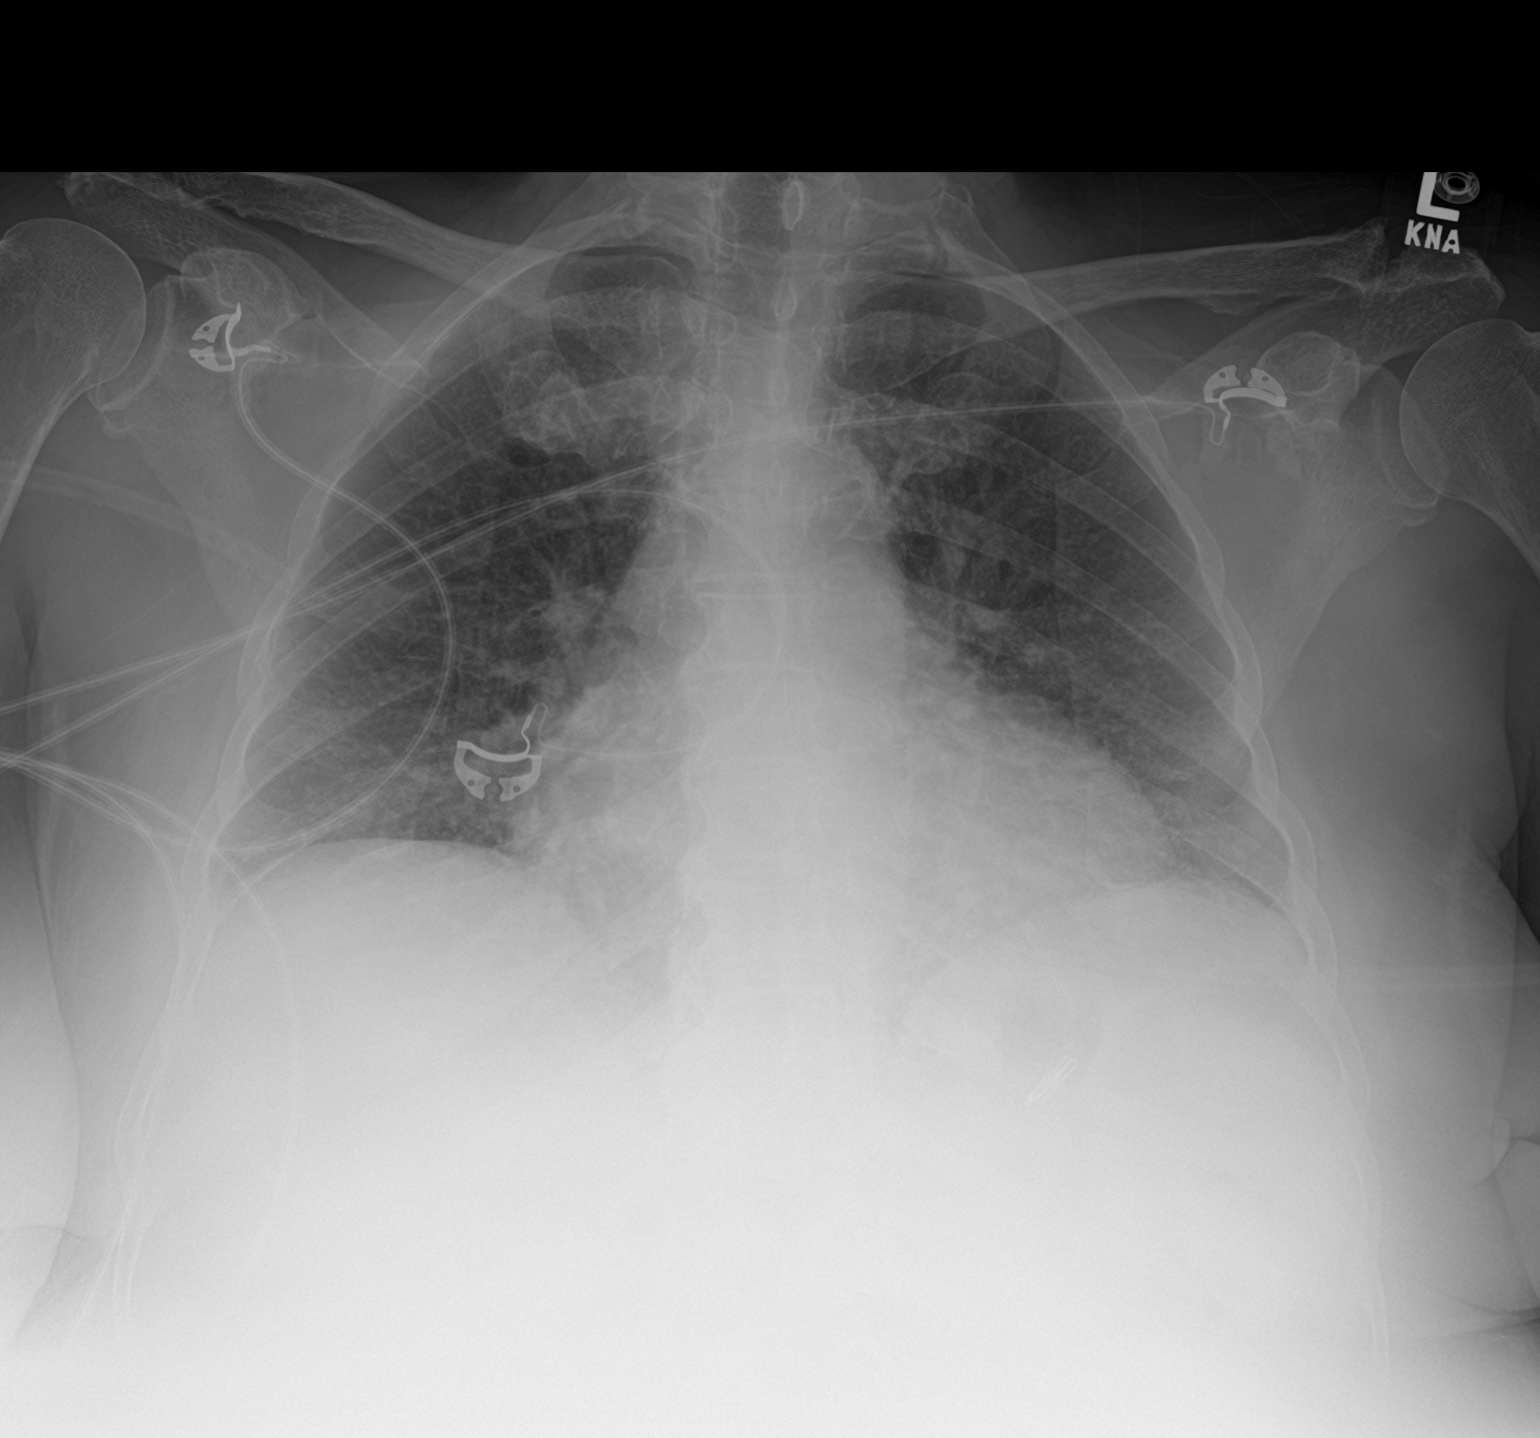

[chest lat]
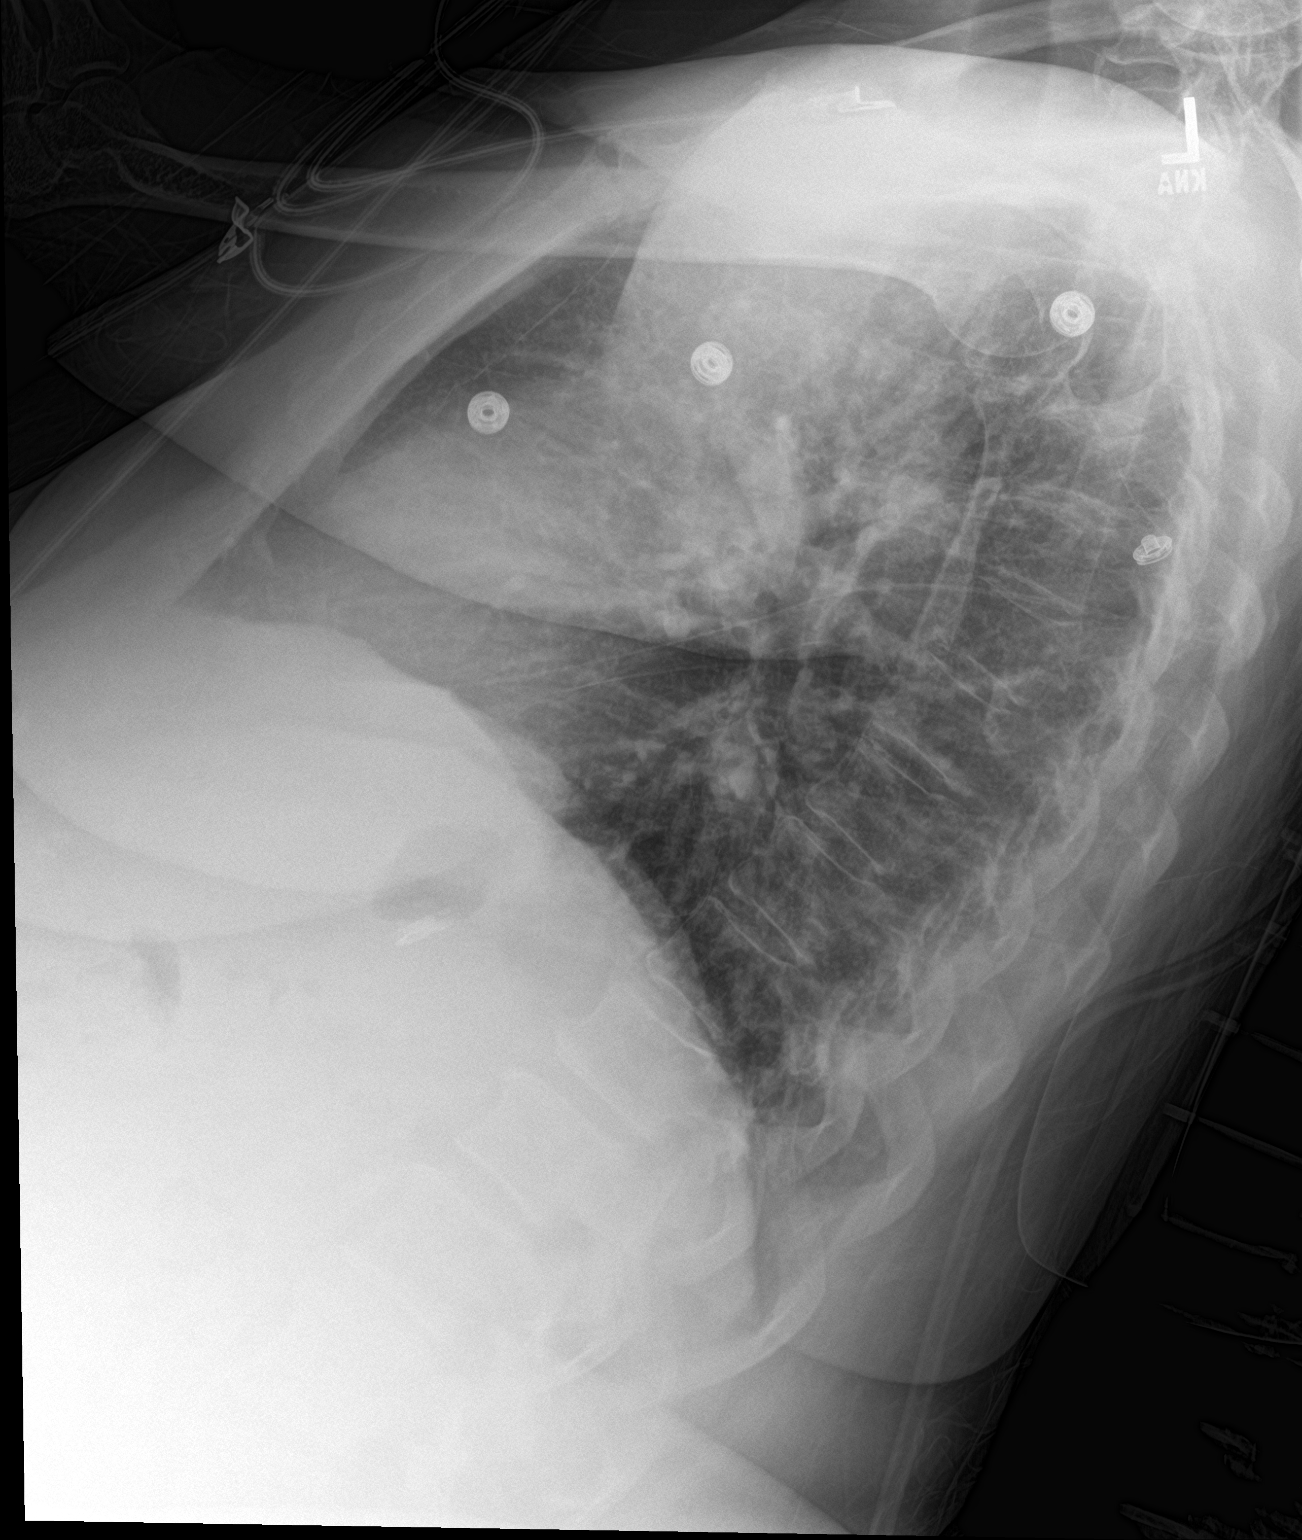

[2 of 2 positions shown; findings below may reference images not displayed]

FINDINGS: No significant pleural effusion. Mild cardiomegaly with vascular
congestion and mild diffuse interstitial opacity consistent with
edema. Aortic atherosclerosis. No pneumothorax.
IMPRESSION: Cardiomegaly with mild central congestion and minimal edema.
